# Patient Record
Sex: Male | Born: 1965 | Race: Black or African American | Hispanic: No | Marital: Married | Smoking: Never smoker
Health system: Southern US, Community
[De-identification: ages and names within clinical notes are randomized; demographics above are authoritative.]

## PROBLEM LIST (undated history)

## (undated) DIAGNOSIS — I2510 Atherosclerotic heart disease of native coronary artery without angina pectoris: Secondary | ICD-10-CM

## (undated) DIAGNOSIS — N390 Urinary tract infection, site not specified: Secondary | ICD-10-CM

## (undated) DIAGNOSIS — Z8669 Personal history of other diseases of the nervous system and sense organs: Secondary | ICD-10-CM

## (undated) DIAGNOSIS — E785 Hyperlipidemia, unspecified: Secondary | ICD-10-CM

## (undated) DIAGNOSIS — E1121 Type 2 diabetes mellitus with diabetic nephropathy: Secondary | ICD-10-CM

## (undated) DIAGNOSIS — N529 Male erectile dysfunction, unspecified: Secondary | ICD-10-CM

## (undated) DIAGNOSIS — R7303 Prediabetes: Secondary | ICD-10-CM

## (undated) DIAGNOSIS — Z9861 Coronary angioplasty status: Secondary | ICD-10-CM

## (undated) DIAGNOSIS — I10 Essential (primary) hypertension: Secondary | ICD-10-CM

## (undated) DIAGNOSIS — J0510 Acute epiglottitis without obstruction: Secondary | ICD-10-CM

## (undated) DIAGNOSIS — R945 Abnormal results of liver function studies: Secondary | ICD-10-CM

## (undated) DIAGNOSIS — Z860101 Personal history of adenomatous and serrated colon polyps: Secondary | ICD-10-CM

## (undated) DIAGNOSIS — N138 Other obstructive and reflux uropathy: Secondary | ICD-10-CM

## (undated) DIAGNOSIS — N401 Enlarged prostate with lower urinary tract symptoms: Secondary | ICD-10-CM

## (undated) DIAGNOSIS — R931 Abnormal findings on diagnostic imaging of heart and coronary circulation: Secondary | ICD-10-CM

## (undated) DIAGNOSIS — E119 Type 2 diabetes mellitus without complications: Secondary | ICD-10-CM

## (undated) DIAGNOSIS — Z800 Family history of malignant neoplasm of digestive organs: Secondary | ICD-10-CM

## (undated) DIAGNOSIS — N182 Chronic kidney disease, stage 2 (mild): Secondary | ICD-10-CM

## (undated) DIAGNOSIS — Z86010 Personal history of colonic polyps: Secondary | ICD-10-CM

## (undated) DIAGNOSIS — N183 Chronic kidney disease, stage 3 unspecified: Secondary | ICD-10-CM

## (undated) DIAGNOSIS — R7989 Other specified abnormal findings of blood chemistry: Secondary | ICD-10-CM

## (undated) DIAGNOSIS — D509 Iron deficiency anemia, unspecified: Secondary | ICD-10-CM

## (undated) DIAGNOSIS — Z8249 Family history of ischemic heart disease and other diseases of the circulatory system: Secondary | ICD-10-CM

## (undated) HISTORY — DX: Personal history of adenomatous and serrated colon polyps: Z86.0101

## (undated) HISTORY — DX: Prediabetes: R73.03

## (undated) HISTORY — PX: COLONOSCOPY W/ POLYPECTOMY: SHX1380

## (undated) HISTORY — DX: Male erectile dysfunction, unspecified: N52.9

## (undated) HISTORY — DX: Other specified abnormal findings of blood chemistry: R79.89

## (undated) HISTORY — DX: Abnormal results of liver function studies: R94.5

## (undated) HISTORY — DX: Essential (primary) hypertension: I10

## (undated) HISTORY — DX: Urinary tract infection, site not specified: N39.0

## (undated) HISTORY — DX: Abnormal findings on diagnostic imaging of heart and coronary circulation: R93.1

## (undated) HISTORY — PX: OTHER SURGICAL HISTORY: SHX169

## (undated) HISTORY — DX: Other obstructive and reflux uropathy: N40.1

## (undated) HISTORY — DX: Type 2 diabetes mellitus with diabetic nephropathy: E11.21

## (undated) HISTORY — DX: Benign prostatic hyperplasia with lower urinary tract symptoms: N13.8

## (undated) HISTORY — DX: Atherosclerotic heart disease of native coronary artery without angina pectoris: I25.10

## (undated) HISTORY — DX: Acute epiglottitis without obstruction: J05.10

## (undated) HISTORY — DX: Family history of ischemic heart disease and other diseases of the circulatory system: Z82.49

## (undated) HISTORY — DX: Type 2 diabetes mellitus without complications: E11.9

## (undated) HISTORY — DX: Coronary angioplasty status: Z98.61

## (undated) HISTORY — DX: Hyperlipidemia, unspecified: E78.5

## (undated) HISTORY — DX: Personal history of colonic polyps: Z86.010

## (undated) HISTORY — DX: Family history of malignant neoplasm of digestive organs: Z80.0

## (undated) HISTORY — DX: Chronic kidney disease, stage 2 (mild): N18.2

## (undated) HISTORY — DX: Personal history of other diseases of the nervous system and sense organs: Z86.69

## (undated) HISTORY — DX: Iron deficiency anemia, unspecified: D50.9

## (undated) HISTORY — DX: Chronic kidney disease, stage 3 unspecified: N18.30

---

## 2010-04-01 ENCOUNTER — Ambulatory Visit: Payer: Self-pay | Admitting: Internal Medicine

## 2010-04-01 DIAGNOSIS — E785 Hyperlipidemia, unspecified: Secondary | ICD-10-CM | POA: Insufficient documentation

## 2010-04-01 DIAGNOSIS — N138 Other obstructive and reflux uropathy: Secondary | ICD-10-CM | POA: Insufficient documentation

## 2010-04-01 DIAGNOSIS — Z860100 Personal history of colon polyps, unspecified: Secondary | ICD-10-CM | POA: Insufficient documentation

## 2010-04-01 DIAGNOSIS — I10 Essential (primary) hypertension: Secondary | ICD-10-CM | POA: Insufficient documentation

## 2010-04-01 DIAGNOSIS — N401 Enlarged prostate with lower urinary tract symptoms: Secondary | ICD-10-CM | POA: Insufficient documentation

## 2010-04-01 LAB — CONVERTED CEMR LAB
Blood in Urine, dipstick: NEGATIVE
Nitrite: NEGATIVE
Protein, U semiquant: NEGATIVE
Urobilinogen, UA: 0.2
WBC Urine, dipstick: NEGATIVE

## 2010-04-18 ENCOUNTER — Encounter: Payer: Self-pay | Admitting: Internal Medicine

## 2010-05-23 ENCOUNTER — Encounter: Payer: Self-pay | Admitting: Internal Medicine

## 2010-05-23 LAB — CONVERTED CEMR LAB
Albumin: 4.4 g/dL (ref 3.5–5.2)
Alkaline Phosphatase: 54 units/L (ref 39–117)
BUN: 18 mg/dL (ref 6–23)
CO2: 28 meq/L (ref 19–32)
CRP: 0 mg/dL (ref <0.6)
Chloride: 103 meq/L (ref 96–112)
Creatinine, Ser: 1.3 mg/dL (ref 0.40–1.50)
Indirect Bilirubin: 0.6 mg/dL (ref 0.0–0.9)
LDL Cholesterol: 118 mg/dL — ABNORMAL HIGH (ref 0–99)
PSA: 0.47 ng/mL (ref 0.10–4.00)
Potassium: 4.7 meq/L (ref 3.5–5.3)
Total Bilirubin: 0.7 mg/dL (ref 0.3–1.2)
VLDL: 38 mg/dL (ref 0–40)

## 2010-06-06 ENCOUNTER — Ambulatory Visit: Payer: Self-pay | Admitting: Internal Medicine

## 2010-10-14 NOTE — Assessment & Plan Note (Signed)
Summary: cpx /dt   Vital Signs:  Patient profile:   45 year old male Height:      64.5 inches Weight:      157.50 pounds BMI:     26.71 O2 Sat:      100 % on Room air Temp:     98.4 degrees F oral Pulse rate:   60 / minute Pulse rhythm:   regular Resp:     18 per minute BP sitting:   114 / 68  (right arm) Cuff size:   large  Vitals Entered By: Glendell Docker CMA (June 06, 2010 2:47 PM)  O2 Flow:  Room air CC: CPX Is Patient Diabetic? No Pain Assessment Patient in pain? no        Primary Care Provider:  Dondra Spry DO  CC:  CPX.  History of Present Illness: 45 y/o male for routine cpx he has hx of hyperlipidemia his reports eating "healthy"  Current Diet: Breakfast:  oatmeal from [employer] or [employer] or cereal at home Lunch: [employer] - salad,  less cheese and dressing DInner: vegetable,  salmon 2 x per week,  occ steak,  leg or wing,  fried chicken 2 x per month Snacks:  love peanuts (no salt) Beverage: traveling - eating out more ,  no fast food  Preventive Screening-Counseling & Management  Alcohol-Tobacco     Smoking Status: never  Allergies: No Known Drug Allergies  Past History:  Past Medical History: Hyperlipidemia - low HDL,  mildly elevated LDL   stopped due to side effects - musculoskeletal complaints Hypertension  Colonic polyps, hx of - 2009  ACE inhibitor cough Hx of fainting spells - evaluated by Dr. 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         No lower extremity edema   Physical Exam  General:  alert, well-developed, and well-nourished.   Neck:  No deformities, masses, or tenderness noted. Lungs:  normal respiratory effort and normal breath sounds.   Heart:  normal rate, regular rhythm, and no gallop.   Abdomen:  soft, non-tender, normal bowel sounds, no masses, no hepatomegaly, and no splenomegaly.   Neurologic:  cranial nerves II-XII intact and gait normal.   Psych:  normally interactive, good eye contact, not anxious appearing, and not depressed appearing.     Impression & Recommendations:  Problem # 1:  HEALTH MAINTENANCE EXAM (ICD-V70.0) Reviewed adult health maintenance protocols.  Td Booster: Historical (01/17/2008)   Chol: 185 (05/23/2010)   HDL: 29 (05/23/2010)   LDL: 118 (05/23/2010)   TG: 189 (05/23/2010) TSH: 0.939 (05/23/2010)   PSA: 0.47 (05/23/2010)  Problem # 2:  HYPERTENSION (ICD-401.9) Assessment: Unchanged  His updated medication list for this problem includes:    Micardis 40 Mg Tabs (Telmisartan) .Marland Kitchen... Take 1 tablet by mouth once a day  BP today: 114/68 Prior BP: 116/80 (04/01/2010)  Labs Reviewed: K+: 4.7 (05/23/2010) Creat: : 1.30 (05/23/2010)   Chol: 185 (  05/23/2010)   HDL: 29 (05/23/2010)   LDL: 118 (05/23/2010)   TG: 189 (05/23/2010)  Problem # 3:  HYPERLIPIDEMIA (ICD-272.4) additional dietary changes recommended  Labs Reviewed: SGOT: 27 (05/23/2010)   SGPT: 28 (05/23/2010)   HDL:29 (05/23/2010)  LDL:118 (05/23/2010)  Chol:185 (05/23/2010)  Trig:189 (05/23/2010)  Complete Medication List: 1)  Micardis 40 Mg Tabs (Telmisartan) .... Take 1 tablet by mouth once a day 2)  Multivitamins Tabs (Multiple vitamin) .... Take 1 tablet by mouth once a day 3)  Aspirin Low Dose 81 Mg Tabs (Aspirin) .... Take 1 tablet by mouth once a day  Patient Instructions: 1)  Please schedule a  follow-up appointment in 6 months 2)  Lipid Panel prior to visit, ICD-9:  272.4 3)  High sensitivity CRP :  272.4 4)  Please return for lab work within 6 months Prescriptions: MICARDIS 40 MG TABS (TELMISARTAN) Take 1 tablet by mouth once a day  #90 x 1   Entered and Authorized by:   D. Thomos Lemons DO   Signed by:   D. Thomos Lemons DO on 06/06/2010   Method used:   [id number]       [address]       [place], [place]  [address]       Ph: [phone] or [phone]       Fax: [phone]   RxID:   [id number]

## 2010-10-14 NOTE — Assessment & Plan Note (Signed)
Summary: NEW EST CARE/DT   Vital Signs:  Patient profile:   45 year old male Height:      64.5 inches Weight:      159 pounds BMI:     26.97 O2 Sat:      99 % Temp:     98 degrees F Pulse rate:   57 / minute Pulse rhythm:   regular Resp:     16 per minute BP sitting:   116 / 80  (left arm) Cuff size:   large  Vitals Entered By: Glendell Docker CMA (April 01, 2010 1:33 PM) CC: Rm 2- New Patient  Is Patient Diabetic? No Pain Assessment Patient in pain? no       Does patient need assistance? Functional Status Self care Ambulation Normal Comments meet & greet, increase in urinary frequency, lower right back pain, no abdominal discomfort, slow start   Primary Care Provider:  Dondra Spry DO  CC:  Rm 2- New Patient .  History of Present Illness: 45 y/o AA male to establish he has hx of hyperlipidemia -  low HDL,  mildly elevated LDL he was previously prescribed statin drug but stopped after experiencing muscle aches he has been treated for mild htn with micardis home bp readings are well controlled no side effects.  he denied ED  Over last 1 week, he notes increase in urinary frequency, lower right back pain  no abdominal discomfort, urine flow is slow to start  Preventive Screening-Counseling & Management  Alcohol-Tobacco     Alcohol drinks/day: <1     Smoking Status: never  Caffeine-Diet-Exercise     Caffeine use/day: 1 per beverage 4 times week      Does Patient Exercise: yes     Times/week: 3  Allergies (verified): No Known Drug Allergies  Past History:  Past Medical History: Hyperlipidemia - low HDL,  mildly elevated LDL   stopped due to side effects - musculoskeletal complaints Hypertension Colonic polyps, hx of - 2009  ACE inhibitor cough Hx of fainting spells - evaluated by Dr. 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         no ED  Physical Exam  General:  alert, well-developed, and well-nourished.   Eyes:  pupils equal, pupils round, and pupils reactive to light.   Ears:  R ear normal and L ear normal.   Mouth:  good dentition and pharynx pink and moist.   Neck:  No deformities, masses, or tenderness noted.no carotid bruits.   Lungs:  normal respiratory effort, normal breath sounds, no crackles, and no wheezes.   Heart:  normal rate, regular rhythm, and no gallop.   Abdomen:  soft, non-tender, normal bowel sounds, no masses, no hepatomegaly, and no splenomegaly.   Rectal:  normal sphincter tone and no masses.   Prostate:  slightly boggy and minimal tenderness,  no nodules and  no asymmetry.   Extremities:  No lower extremity edema Neurologic:  cranial nerves II-XII intact and gait normal.   Psych:  normally interactive, good eye contact, not anxious appearing, and not depressed appearing.     Impression & Recommendations:  Problem # 1:  HYPERTENSION (ICD-401.9) well controlled.  Maintain current medication regimen. monitor bmet His updated medication list for this problem includes:    Micardis 40 Mg Tabs (Telmisartan) .Marland Kitchen... Take 1 tablet by mouth once a day  BP today: 116/80  Problem # 2:  HYPERLIPIDEMIA (ICD-272.4) prev rx 'ed statin but stopped due muscle aches. reviewed dietary changes repeat FLP check CRP  Problem # 3:  FREQUENCY, URINARY  (ICD-788.41) mild urinary freq.  prostate is slightly boggy and tender.  tx with cipro  Complete Medication List: 1)  Micardis 40 Mg Tabs (Telmisartan) .... Take 1 tablet by mouth once a day 2)  Multivitamins Tabs (Multiple vitamin) .... Take 1 tablet by mouth once a day 3)  Aspirin Low Dose 81 Mg Tabs (Aspirin) .... Take 1 tablet by mouth once a day 4)  Ciprofloxacin Hcl 250 Mg Tabs (Ciprofloxacin hcl) .... One by mouth two times a day  Other Orders: UA Dipstick w/o Micro (manual) ([address])  Patient Instructions: 1)  Follow low saturated fat diet 2)  Please schedule a follow-up appointment in 2 months. 3)  BMP prior to visit, ICD-9: 401.9 4)  Hepatic Panel prior to visit, ICD-9: 272.4 5)  Lipid Panel prior to visit, ICD-9: 272.4 6)  TSH prior to visit, ICD-9: 272.4 7)  PSA prior to visit, ICD-9:  600.01 8)  High sensitivity CRP:  272.4 9)  Please return for lab work one (1) week before your next appointment.  10)  [email or web address] Prescriptions: CIPROFLOXACIN HCL 250 MG TABS (CIPROFLOXACIN HCL) one by mouth two times a day  #20 x 0   Entered and Authorized by:   D. Thomos Lemons DO   Signed by:   D. Thomos Lemons DO on 04/01/2010   Method used:   Print then Give to Patient   RxID:   [id number]      Preventive Care Screening  Last Tetanus Booster:    Date:  01/17/2008    Results:  Historical     Current Allergies (reviewed today): No known allergies   Laboratory Results   Urine Tests    Routine Urinalysis   Color: yellow Appearance: Clear Glucose: negative   (Normal Range: Negative) Bilirubin: negative   (Normal Range: Negative) Ketone: negative   (Normal Range: Negative) Spec. Gravity: 1.020   (Normal Range: 1.003-1.035) Blood: negative   (Normal Range: Negative) pH: 6.5   (Normal Range: 5.0-8.0) Protein: negative   (Normal Range: Negative) Urobilinogen: 0.2   (Normal Range: 0-1) Nitrite: negative   (Normal Range: Negative) Leukocyte Esterace: negative    (Normal Range: Negative)

## 2010-10-14 NOTE — Miscellaneous (Signed)
Summary: Lab orders for September appt  Clinical Lists Changes  Orders: Added new Test order of T-Basic Metabolic Panel [phone]) - Signed Added new Test order of T-Hepatic Function [phone]) - Signed Added new Test order of T-Lipid Profile [id number]) - Signed Added new Test order of T-TSH [phone]) - Signed Added new Test order of T-PSA ([id number]) - Signed Added new Test order of T-C-Reactive Protein [phone]) - Signed

## 2011-02-03 ENCOUNTER — Telehealth: Payer: Self-pay | Admitting: Internal Medicine

## 2011-02-03 DIAGNOSIS — E785 Hyperlipidemia, unspecified: Secondary | ICD-10-CM

## 2011-02-03 DIAGNOSIS — I10 Essential (primary) hypertension: Secondary | ICD-10-CM

## 2011-02-03 MED ORDER — TELMISARTAN 40 MG PO TABS: 40.0000 mg | ORAL_TABLET | Freq: Every day | ORAL | Status: DC

## 2011-02-03 NOTE — Telephone Encounter (Signed)
Call placed to patient [phone], no answer. A detailed voice message was left informing patient rx would be sent to pharmacy. Message also left informing patient that was he was past due for 6 month follow up with fasting blood work.  He was advised to call back to schedule follow up and lab orders entered for [facility].  Rx refill telephoned to [facility]ist at [facility]

## 2011-02-03 NOTE — Telephone Encounter (Signed)
Refill- micardis 40mg  tablet. Take 1 tablet by mouth once a day. Qty 90. Last fill 4.19.12

## 2011-02-05 ENCOUNTER — Encounter: Payer: Self-pay | Admitting: Family Medicine

## 2011-02-05 ENCOUNTER — Ambulatory Visit (INDEPENDENT_AMBULATORY_CARE_PROVIDER_SITE_OTHER): Payer: BC Managed Care – PPO | Admitting: Family Medicine

## 2011-02-05 ENCOUNTER — Other Ambulatory Visit: Payer: Self-pay | Admitting: Family Medicine

## 2011-02-05 VITALS — BP 147/89 | HR 71 | Temp 98.1°F | Ht 64.5 in | Wt 160.8 lb

## 2011-02-05 DIAGNOSIS — N390 Urinary tract infection, site not specified: Secondary | ICD-10-CM

## 2011-02-05 DIAGNOSIS — N402 Nodular prostate without lower urinary tract symptoms: Secondary | ICD-10-CM

## 2011-02-05 DIAGNOSIS — N410 Acute prostatitis: Secondary | ICD-10-CM

## 2011-02-05 LAB — POCT URINALYSIS DIPSTICK
Glucose, UA: NEGATIVE
Nitrite, UA: NEGATIVE
Urobilinogen, UA: 0.2

## 2011-02-05 MED ORDER — CIPROFLOXACIN HCL 500 MG PO TABS: 500.0000 mg | ORAL_TABLET | Freq: Two times a day (BID) | ORAL | Status: AC

## 2011-02-05 NOTE — Assessment & Plan Note (Signed)
Refer to [facility] for further eval/management.

## 2011-02-05 NOTE — Progress Notes (Signed)
OFFICE VISIT  02/05/2011   CC:  Chief Complaint  Patient presents with  . Urinary Tract Infection    Low back pain, burning during urination X 4 days     HPI:    Patient is a 45 y.o. African-American male who presents for urinary complaints. Normally sees Dr. Artist Pais at the [facility] office. Reports having flu-like symptoms last week and into this week: about 7d of subjective f/c, cough, achiness, URI sx's.  With these symptoms he also developed increased urinary frequency, mild urgency and dysuria, and frequent sense of incomplete bladder emptying.  No blood in urine.  +Mild diffuse low back soreness.  The flu-like/resp sx's/fevers all resolved several days ago but his urinary complaints and his low back soreness persist. He has no history of high risk sexual behavior (married, no infidelity, no hx of STD).  He had acute prostatitis in 2011 which clinically resolved with a course of cipro.  He has mild chronic BPH symptoms that are not to the level of needing treatment.  No history of abnormal prostate exams and he has not seen a urologist in the past.  He had a normal PSA last year ( Lab Results  Component Value Date   PSA 0.47 05/23/2010  He did take some tylenol and mucinex-D during his recent illness.    Past Medical History  Diagnosis Date  . Hypertension   As noted above in HPI.  Past Surgical History  Procedure Date  . Dental implants    SH: Married, one child.  They both recently had the same viral syndrome he had. He is a Engineer, materials, lives in [place], [place]. No Tobacco, rare ETOH, no drug use.  Outpatient Prescriptions Prior to Visit  Medication Sig Dispense Refill  . telmisartan (MICARDIS) 40 MG tablet Take 1 tablet (40 mg total) by mouth daily.  30 tablet  0    No Known Allergies  ROS No chronic change in appetite, no unexplained wt loss, no bone pain, no skin rash, no abdominal pain, no chest pain, no SOB, no joint swelling.  No focal weakness.  PE: Blood pressure  147/89, pulse 71, temperature 98.1 F (36.7 C), temperature source Oral, height 5' 4.5" (1.638 m), weight 160 lb 12.8 oz (72.938 kg), SpO2 98.00%. Gen: Alert, well appearing.  Patient is oriented to person, place, time, and situation. Rectal: tone normal.  Rectal mucosa normal to palpation.  His prostate feels normal size but is diffusely very firm and there is asymmetry of shape noted, with the left side being much more prominent than the right.  There is mild TTP on prostate gland diffusely, but worse on right side.  LABS:  CC UA today showed small LEU and was otherwise normal. Urine culture pending  IMPRESSION AND PLAN:  Prostatitis, acute Cipro 500mg  bid x 14d.  Therapeutic expectations and side effect profile of medication discussed today.  Patient's questions answered.  Discussed dx with him, including the fact that sometimes bacterial prostate infections don't result in a positive urine culture result, plus some prostate infections are nonbacterial).   I encouraged him to f/u with either me or Dr. Artist Pais if his symptoms are not improving with the abx in the next 4-6 days.  Prostate nodule Refer to [facility] for further eval/management.     FOLLOW UP: Return if symptoms worsen or fail to improve.

## 2011-02-05 NOTE — Assessment & Plan Note (Signed)
Cipro 500mg  bid x 14d.  Therapeutic expectations and side effect profile of medication discussed today.  Patient's questions answered.  Discussed dx with him, including the fact that sometimes bacterial prostate infections don't result in a positive urine culture result, plus some prostate infections are nonbacterial).   I encouraged him to f/u with either me or Dr. Artist Pais if his symptoms are not improving with the abx in the next 4-6 days.

## 2011-02-08 LAB — URINE CULTURE: Colony Count: >=100000

## 2011-03-16 ENCOUNTER — Other Ambulatory Visit: Payer: Self-pay | Admitting: *Deleted

## 2011-03-16 DIAGNOSIS — I10 Essential (primary) hypertension: Secondary | ICD-10-CM

## 2011-03-16 MED ORDER — TELMISARTAN 40 MG PO TABS: 40.0000 mg | ORAL_TABLET | Freq: Every day | ORAL | Status: DC

## 2011-03-16 NOTE — Telephone Encounter (Signed)
Call received from pharmacy for refill on Micardis

## 2011-03-27 ENCOUNTER — Other Ambulatory Visit: Payer: Self-pay | Admitting: Family Medicine

## 2011-03-27 DIAGNOSIS — I10 Essential (primary) hypertension: Secondary | ICD-10-CM

## 2011-03-27 NOTE — Telephone Encounter (Signed)
Please advise 

## 2011-03-27 NOTE — Telephone Encounter (Signed)
RX called in on 03/16/11.  Per Arline Asp at pharmacy RX is there and ready to be picked up.  Advised pt RX ready.

## 2011-03-27 NOTE — Telephone Encounter (Signed)
[facility]

## 2011-04-07 ENCOUNTER — Encounter: Payer: BC Managed Care – PPO | Admitting: Family Medicine

## 2011-04-08 ENCOUNTER — Encounter: Payer: Self-pay | Admitting: Family Medicine

## 2011-04-13 ENCOUNTER — Encounter: Payer: Self-pay | Admitting: Family Medicine

## 2011-04-13 ENCOUNTER — Telehealth: Payer: Self-pay | Admitting: Family Medicine

## 2011-04-13 NOTE — Telephone Encounter (Signed)
Please request record of his initial consult visit with [facility].  I got the office note from their follow up visit with him today, but I never got sent the initial visit note.  thx--PM

## 2011-04-17 ENCOUNTER — Encounter: Payer: Self-pay | Admitting: Family Medicine

## 2011-04-17 ENCOUNTER — Ambulatory Visit (INDEPENDENT_AMBULATORY_CARE_PROVIDER_SITE_OTHER): Payer: BC Managed Care – PPO | Admitting: Family Medicine

## 2011-04-17 DIAGNOSIS — I10 Essential (primary) hypertension: Secondary | ICD-10-CM

## 2011-04-17 DIAGNOSIS — N410 Acute prostatitis: Secondary | ICD-10-CM

## 2011-04-17 NOTE — Progress Notes (Signed)
OFFICE NOTE  04/17/2011  CC:  Chief Complaint  Patient presents with  . Annual Exam    no complaints     HPI: Patient is a 45 y.o. African-American male who is here for follow up prostatitis and HTN. Initially made appt for annual CPE but it has not been a year since his last CPE and labs so we decided to reschedule this. No high bps to report, +compliant with meds, no side effects. Saw urologist, who feels that his abnl prostate exam is not worrisome, repeat PSA there was reassuring.  They did feel like his prostate infection required another round of antibiotics, so this was done and he feels normal now.  Pertinent PMH:  HTN Prostatitis Hyperlipidemia  Pertinent Meds:  PE: Blood pressure 149/89, pulse 55, temperature 97.7 F (36.5 C), temperature source Oral, height 5' 5.5" (1.664 m), weight 161 lb (73.029 kg), SpO2 98.00%.   This bp was with our automated cuff.  I rechecked his bp in each arm with the manual bp cuff in the exam room and got 130/84 on right, 126/82 on left. Gen: Alert, well appearing.  Patient is oriented to person, place, time, and situation. No further exam today.  IMPRESSION AND PLAN: HTN, stable.  Continue current med. Prostatitis, resolved.  Asymmetric prostate on exam--reassured by urologist that this was not a concern, PSA normal.  FOLLOW UP: Make appt for annual CPE with fasting labs after 06/07/11.

## 2011-04-21 NOTE — Telephone Encounter (Signed)
Records have been received.

## 2011-07-07 ENCOUNTER — Other Ambulatory Visit: Payer: Self-pay | Admitting: *Deleted

## 2011-07-07 DIAGNOSIS — I10 Essential (primary) hypertension: Secondary | ICD-10-CM

## 2011-07-07 MED ORDER — TELMISARTAN 40 MG PO TABS: 40.0000 mg | ORAL_TABLET | Freq: Every day | ORAL | Status: DC

## 2011-07-07 NOTE — Telephone Encounter (Signed)
Pt is due for follow up, CPE.  30 day supply given.

## 2011-08-17 ENCOUNTER — Other Ambulatory Visit: Payer: Self-pay | Admitting: *Deleted

## 2011-08-17 DIAGNOSIS — I10 Essential (primary) hypertension: Secondary | ICD-10-CM

## 2011-08-17 MED ORDER — TELMISARTAN 40 MG PO TABS: 40.0000 mg | ORAL_TABLET | Freq: Every day | ORAL | Status: DC

## 2011-08-17 NOTE — Telephone Encounter (Signed)
RF done x 5. F/u in 3 mo for a routine hypertension follow up OR if he wants he can make this appt for a CPE with fasting labs (30 minute morning appt).

## 2011-08-17 NOTE — Telephone Encounter (Signed)
Pt notified and appt scheduled for 11/03/11 for CPE w/fasting labs.

## 2011-08-17 NOTE — Telephone Encounter (Signed)
Last seen 04/17/11, no follow up mentioned except for CPE after 06/07/11.  No appt on file.  OK to fill?  Pt would like more than one refill at a time.  How many refills?

## 2011-11-03 ENCOUNTER — Encounter: Payer: BC Managed Care – PPO | Admitting: Family Medicine

## 2011-11-04 ENCOUNTER — Telehealth: Payer: Self-pay | Admitting: Family Medicine

## 2011-11-04 ENCOUNTER — Ambulatory Visit (INDEPENDENT_AMBULATORY_CARE_PROVIDER_SITE_OTHER): Payer: BC Managed Care – PPO | Admitting: Family Medicine

## 2011-11-04 ENCOUNTER — Encounter: Payer: Self-pay | Admitting: Family Medicine

## 2011-11-04 VITALS — BP 128/85 | HR 57 | Temp 97.1°F | Ht 65.5 in | Wt 160.0 lb

## 2011-11-04 DIAGNOSIS — Z125 Encounter for screening for malignant neoplasm of prostate: Secondary | ICD-10-CM

## 2011-11-04 DIAGNOSIS — Z0000 Encounter for general adult medical examination without abnormal findings: Secondary | ICD-10-CM | POA: Insufficient documentation

## 2011-11-04 DIAGNOSIS — I10 Essential (primary) hypertension: Secondary | ICD-10-CM

## 2011-11-04 DIAGNOSIS — N402 Nodular prostate without lower urinary tract symptoms: Secondary | ICD-10-CM

## 2011-11-04 LAB — CBC WITH DIFFERENTIAL/PLATELET
Eosinophils Relative: 3.3 % (ref 0.0–5.0)
MCV: 94.3 fl (ref 78.0–100.0)
Monocytes Absolute: 0.4 10*3/uL (ref 0.1–1.0)
Monocytes Relative: 11.9 % (ref 3.0–12.0)
Neutrophils Relative %: 36 % — ABNORMAL LOW (ref 43.0–77.0)
Platelets: 163 10*3/uL (ref 150.0–400.0)
WBC: 3.5 10*3/uL — ABNORMAL LOW (ref 4.5–10.5)

## 2011-11-04 LAB — LIPID PANEL
Cholesterol: 213 mg/dL — ABNORMAL HIGH (ref 0–200)
VLDL: 48.4 mg/dL — ABNORMAL HIGH (ref 0.0–40.0)

## 2011-11-04 LAB — COMPREHENSIVE METABOLIC PANEL
AST: 29 U/L (ref 0–37)
BUN: 17 mg/dL (ref 6–23)
Calcium: 9.2 mg/dL (ref 8.4–10.5)
Chloride: 100 mEq/L (ref 96–112)
Creatinine, Ser: 1.3 mg/dL (ref 0.4–1.5)
GFR: 75.16 mL/min (ref >60.00)
Glucose, Bld: 89 mg/dL (ref 70–99)

## 2011-11-04 LAB — TSH: TSH: 0.64 u[IU]/mL (ref 0.35–5.50)

## 2011-11-04 LAB — PSA: PSA: 1.74 ng/mL (ref 0.10–4.00)

## 2011-11-04 LAB — LDL CHOLESTEROL, DIRECT: Direct LDL: 119.9 mg/dL

## 2011-11-04 MED ORDER — TELMISARTAN 40 MG PO TABS: 40.0000 mg | ORAL_TABLET | Freq: Every day | ORAL | Status: DC

## 2011-11-04 NOTE — Patient Instructions (Signed)
Health Maintenance, Males A healthy lifestyle and preventative care can promote health and wellness.  Maintain regular health, dental, and eye exams.   Eat a healthy diet. Foods like vegetables, fruits, whole grains, low-fat dairy products, and lean protein foods contain the nutrients you need without too many calories. Decrease your intake of foods high in solid fats, added sugars, and salt. Get information about a proper diet from your caregiver, if necessary.   Regular physical exercise is one of the most important things you can do for your health. Most adults should get at least 150 minutes of moderate-intensity exercise (any activity that increases your heart rate and causes you to sweat) each week. In addition, most adults need muscle-strengthening exercises on 2 or more days a week.    Maintain a healthy weight. The body mass index (BMI) is a screening tool to identify possible weight problems. It provides an estimate of body fat based on height and weight. Your caregiver can help determine your BMI, and can help you achieve or maintain a healthy weight. For adults 20 years and older:   A BMI below 18.5 is considered underweight.   A BMI of 18.5 to 24.9 is normal.   A BMI of 25 to 29.9 is considered overweight.   A BMI of 30 and above is considered obese.   Maintain normal blood lipids and cholesterol by exercising and minimizing your intake of saturated fat. Eat a balanced diet with plenty of fruits and vegetables. Blood tests for lipids and cholesterol should begin at age 20 and be repeated every 5 years. If your lipid or cholesterol levels are high, you are over 50, or you are a high risk for heart disease, you may need your cholesterol levels checked more frequently.Ongoing high lipid and cholesterol levels should be treated with medicines, if diet and exercise are not effective.   If you smoke, find out from your caregiver how to quit. If you do not use tobacco, do not start.    If you choose to drink alcohol, do not exceed 2 drinks per day. One drink is considered to be 12 ounces (355 mL) of beer, 5 ounces (148 mL) of wine, or 1.5 ounces (44 mL) of liquor.   Avoid use of street drugs. Do not share needles with anyone. Ask for help if you need support or instructions about stopping the use of drugs.   High blood pressure causes heart disease and increases the risk of stroke. Blood pressure should be checked at least every 1 to 2 years. Ongoing high blood pressure should be treated with medicines if weight loss and exercise are not effective.   If you are 45 to 48 years old, ask your caregiver if you should take aspirin to prevent heart disease.   Diabetes screening involves taking a blood sample to check your fasting blood sugar level. This should be done once every 3 years, after age 45, if you are within normal weight and without risk factors for diabetes. Testing should be considered at a younger age or be carried out more frequently if you are overweight and have at least 1 risk factor for diabetes.   Colorectal cancer can be detected and often prevented. Most routine colorectal cancer screening begins at the age of 50 and continues through age 75. However, your caregiver may recommend screening at an earlier age if you have risk factors for colon cancer. On a yearly basis, your caregiver may provide home test kits to check for hidden   blood in the stool. Use of a small camera at the end of a tube, to directly examine the colon (sigmoidoscopy or colonoscopy), can detect the earliest forms of colorectal cancer. Talk to your caregiver about this at age 50, when routine screening begins. Direct examination of the colon should be repeated every 5 to 10 years through age 75, unless early forms of pre-cancerous polyps or small growths are found.   Healthy men should no longer receive prostate-specific antigen (PSA) blood tests as part of routine cancer screening. Consult with  your caregiver about prostate cancer screening.   Practice safe sex. Use condoms and avoid high-risk sexual practices to reduce the spread of sexually transmitted infections (STIs).   Use sunscreen with a sun protection factor (SPF) of 30 or greater. Apply sunscreen liberally and repeatedly throughout the day. You should seek shade when your shadow is shorter than you. Protect yourself by wearing long sleeves, pants, a wide-brimmed hat, and sunglasses year round, whenever you are outdoors.   Notify your caregiver of new moles or changes in moles, especially if there is a change in shape or color. Also notify your caregiver if a mole is larger than the size of a pencil eraser.   A one-time screening for abdominal aortic aneurysm (AAA) and surgical repair of large AAAs by sound wave imaging (ultrasonography) is recommended for ages 65 to 75 years who are current or former smokers.   Stay current with your immunizations.  Document Released: 02/27/2008 Document Revised: 05/13/2011 Document Reviewed: 01/26/2011 [employer] Patient Information 2012 [employer]. 

## 2011-11-04 NOTE — Assessment & Plan Note (Signed)
Urologist felt this was inflammitory-related and it appears he was right, as his prostate feels normal today and he is asymptomatic. Screening PSA today.

## 2011-11-04 NOTE — Telephone Encounter (Signed)
Pls request records from [facility] in [place].  thx.

## 2011-11-04 NOTE — Progress Notes (Signed)
Office Note 11/04/2011  CC:  Chief Complaint  Patient presents with  . Annual Exam    no problems, fasting labs    HPI:  Thomas Spencer is a 46 y.o. Black male who is here for CPE and fasting labs. Feeling well.  Denies any urinary complaints at all lately. Complains of some mild chronic bilat shoulder pains, improved by exercising/lifting wts. No weakness or paresthesias, no impairment in activities/functioning, no need for OTC pain meds.   Past Medical History  Diagnosis Date  . Hypertension   . BPH with obstruction/lower urinary tract symptoms     [facility] referral done 01/2011  . Family history of colon cancer     father    Past Surgical History  Procedure Date  . Dental implants   . Colonoscopy w/ polypectomy approx 2010    [facility]--pt reports benign    Family History  Problem Relation Age of Onset  . Heart attack Mother   . Heart disease Mother   . Hyperlipidemia Mother   . Hypertension Mother   . Cancer Father     colon  . Cancer Sister     cervical/ remission  . Heart disease Brother   . Hyperlipidemia Brother   . Hypertension Brother   . Diabetes Sister     type 2  . Hypertension Sister   . Hypertension Sister   . Diabetes Sister     type 2    History   Social History  . Marital Status: Married    Spouse Name: N/A    Number of Children: N/A  . Years of Education: N/A   Occupational History  . Not on file.   Social History Main Topics  . Smoking status: Never Smoker   . Smokeless tobacco: Never Used  . Alcohol Use: Yes     wine- socially  . Drug Use: No  . Sexually Active: Yes -- Male partner(s)   Other Topics Concern  . Not on file   Social History Narrative   SH: Married, one child.  He is a Engineer, materials, lives in [place], [place].No Tobacco, rare ETOH, no drug use.    Outpatient Prescriptions Prior to Visit  Medication Sig Dispense Refill  . aspirin 81 MG tablet Take 81 mg by mouth daily.        . Multiple  Vitamin (MULTIVITAMIN) tablet Take 1 tablet by mouth daily.        . Omega-3 Fatty Acids (FISH OIL) 1000 MG CAPS Take 2 capsules by mouth daily.       Marland Kitchen telmisartan (MICARDIS) 40 MG tablet Take 1 tablet (40 mg total) by mouth daily. PT DUE FOR APPT FOR MORE REFILLS  30 tablet  5    Allergies  Allergen Reactions  . Ace Inhibitors Cough    ROS Review of Systems  Constitutional: Negative for fever, chills, appetite change and fatigue.  HENT: Negative for ear pain, congestion, sore throat, neck stiffness and dental problem.   Eyes: Negative for discharge, redness and visual disturbance.  Respiratory: Negative for cough, chest tightness, shortness of breath and wheezing.   Cardiovascular: Negative for chest pain, palpitations and leg swelling.  Gastrointestinal: Negative for nausea, vomiting, abdominal pain, diarrhea and blood in stool.  Genitourinary: Negative for dysuria, urgency, frequency, hematuria, flank pain and difficulty urinating.  Musculoskeletal: Positive for arthralgias (shoulders, as noted in HPI). Negative for myalgias, back pain and joint swelling.  Skin: Negative for pallor and rash.  Neurological: Negative for dizziness,  speech difficulty, weakness and headaches.  Hematological: Negative for adenopathy. Does not bruise/bleed easily.  Psychiatric/Behavioral: Negative for confusion and sleep disturbance. The patient is not nervous/anxious.     PE; Blood pressure 128/85, pulse 57, temperature 97.1 F (36.2 C), temperature source Temporal, height 5' 5.5" (1.664 m), weight 160 lb (72.576 kg). Gen: Alert, well appearing.  Patient is oriented to person, place, time, and situation. ENT: Ears: EACs clear, normal epithelium.  TMs with good light reflex and landmarks bilaterally.  Eyes: no injection, icteris, swelling, or exudate.  EOMI, PERRLA. Nose: no drainage or turbinate edema/swelling.  No injection or focal lesion.  Mouth: lips without lesion/swelling.  Oral mucosa pink and  moist.  Dentition intact and without obvious caries or gingival swelling.  Oropharynx without erythema, exudate, or swelling.  Neck - No masses or thyromegaly or limitation in range of motion CV: RRR, no m/r/g.   LUNGS: CTA bilat, nonlabored resps, good aeration in all lung fields. ABD: soft, NT, ND, BS normal.  No hepatospenomegaly or mass.  No bruits. EXT: no clubbing, cyanosis, or edema.  Shoulders without TTP.  No pain or stiffness with ROM, no limitation in ROM.  Strength 5/5 UEs bilat. Genitals normal; both testes normal without tenderness, masses, hydroceles, varicoceles, erythema or swelling. Shaft normal, uncircumcised, meatus normal without discharge. No inguinal hernia noted but slight palpable pressure can be felt in left ingu canal when pt asked to forcefully cough. No inguinal lymphadenopathy.  Rectal exam: negative without mass, lesions or tenderness.  Prostate is without enlargement or asymmetry or nodule.   Pertinent labs:  None today  ASSESSMENT AND PLAN:   Health maintenance examination Reviewed age and gender appropriate health maintenance issues (prudent diet, regular exercise, health risks of tobacco and excessive alcohol, use of seatbelts, fire alarms in home, use of sunscreen).  Also reviewed age and gender appropriate health screening as well as vaccine recommendations. Colonoscopy (+FH) is UTD. Check PSA today (DRE normal today), as well as FLP, CBC, CMET, and TSH.   Prostate nodule Urologist felt this was inflammitory-related and it appears he was right, as his prostate feels normal today and he is asymptomatic. Screening PSA today.   HTN: well controlled.  Micardis RFd today.  FOLLOW UP:  Return in about 6 months (around 05/03/2012) for f/u HTN.

## 2011-11-04 NOTE — Assessment & Plan Note (Signed)
Reviewed age and gender appropriate health maintenance issues (prudent diet, regular exercise, health risks of tobacco and excessive alcohol, use of seatbelts, fire alarms in home, use of sunscreen).  Also reviewed age and gender appropriate health screening as well as vaccine recommendations. Colonoscopy (+FH) is UTD. Check PSA today (DRE normal today), as well as FLP, CBC, CMET, and TSH.

## 2011-11-04 NOTE — Telephone Encounter (Signed)
[facility] will fax 2007 OV notes from appt with Dr Laural Benes

## 2011-11-05 ENCOUNTER — Encounter: Payer: Self-pay | Admitting: Family Medicine

## 2011-11-05 ENCOUNTER — Telehealth: Payer: Self-pay | Admitting: Family Medicine

## 2011-11-05 NOTE — Telephone Encounter (Signed)
Pt notified and he will call [facility] to schedule.

## 2011-11-05 NOTE — Telephone Encounter (Signed)
[facility] records indicate his last colonoscopy was 2007 and repeat was recommended 5 yrs later, which makes him DUE for this now. Per our conversation yesterday during his o/v he seemed to think he had a few years to go before repeat was needed.  Pls notify him that he should contact [facility] for f/u regarding this issue (He saw Dr. Danise Edge)  thx--PM

## 2011-11-10 ENCOUNTER — Telehealth: Payer: Self-pay | Admitting: Family Medicine

## 2011-11-10 DIAGNOSIS — R972 Elevated prostate specific antigen [PSA]: Secondary | ICD-10-CM

## 2011-11-10 NOTE — Telephone Encounter (Signed)
Spoke with Dr. Isabel Caprice, pt's urologist, about recent PSA showing increased PSA velocity compared to PSA done about 554mo ago. Dr. Isabel Caprice voiced concern and recommended pt make appt with him to discuss this.  He said if pt was reluctant to do this at this time, then he would recommend repeat PSA in 54mo and proceed as appropriate based on results of that test.  I called pt this morning and have LMOM to call med back at office today.

## 2011-11-11 NOTE — Telephone Encounter (Signed)
Spoke w/ pt today and discussed PSA and cholesterol results. Plan: repeat PSA in 85mo in our lab and if level same or higher then he'll proceed with office f/u with Dr. Isabel Caprice. If PSA lower, I'll touch base with Dr. Isabel Caprice about ongoing surveillance. Plan for cholesterol is to INCREASE exercise and focus more on low chol/low fat diet, repeat lipid panel in 26mo. Pt to call back and make lab appts.--PM

## 2011-11-16 ENCOUNTER — Telehealth: Payer: Self-pay | Admitting: Family Medicine

## 2011-11-16 NOTE — Telephone Encounter (Signed)
Patient is requesting to speak with Dr Milinda Cave

## 2011-11-16 NOTE — Telephone Encounter (Signed)
Returned call to pt.  Pt will not disclose why he needs to speak with physician and states there is nothing that I can help him with.  Advised pt that Dr. Milinda Cave is with patients this PM and I can not guarantee that he will receive a call back today.  Pt states that is OK, that any day this week would be OK.

## 2011-11-17 NOTE — Telephone Encounter (Signed)
Called pt.  He has changed his mind and wants to pursue further evaluation and management of his increased PSA velocity with Dr. Isabel Caprice.  I said this was fine and encouraged him to call Dr. Ellin Goodie office for f/u appt since he has already seen him once and does not need referral from [place].

## 2011-11-23 ENCOUNTER — Encounter: Payer: Self-pay | Admitting: *Deleted

## 2012-01-13 HISTORY — PX: PROSTATE BIOPSY: SHX241

## 2012-02-03 ENCOUNTER — Encounter: Payer: Self-pay | Admitting: Family Medicine

## 2012-03-07 ENCOUNTER — Ambulatory Visit (INDEPENDENT_AMBULATORY_CARE_PROVIDER_SITE_OTHER): Payer: BC Managed Care – PPO | Admitting: Family Medicine

## 2012-03-07 ENCOUNTER — Encounter: Payer: Self-pay | Admitting: Family Medicine

## 2012-03-07 VITALS — BP 130/82 | HR 51 | Temp 97.2°F | Ht 65.5 in | Wt 164.0 lb

## 2012-03-07 DIAGNOSIS — J302 Other seasonal allergic rhinitis: Secondary | ICD-10-CM | POA: Insufficient documentation

## 2012-03-07 DIAGNOSIS — H6120 Impacted cerumen, unspecified ear: Secondary | ICD-10-CM

## 2012-03-07 DIAGNOSIS — J309 Allergic rhinitis, unspecified: Secondary | ICD-10-CM

## 2012-03-07 MED ORDER — FLUTICASONE PROPIONATE 50 MCG/ACT NA SUSP: 2.0000 | Freq: Every day | NASAL | Status: DC

## 2012-03-07 MED ORDER — DESLORATADINE 5 MG PO TABS: 5.0000 mg | ORAL_TABLET | Freq: Every day | ORAL | Status: DC

## 2012-03-07 NOTE — Progress Notes (Signed)
OFFICE NOTE  03/07/2012  CC:  Chief Complaint  Patient presents with  . Sore Throat    x 1 month     HPI: Patient is a 46 y.o. African-American male who is here for sore throat x approximately 1 month. Describes some left ear fullness/popping intermittently, sore throat and some intermittent--lasts 2 d or so--left sided face swelling over mandible in front of left ear and under mandible in neck soft tissues.  No teeth pain.  Feels "low grade" fever when he has swelling.  Some mild PND.  Minimal coughing.  Energy and appetite normal/unchanged.   Valsalva helps sometimes to open up left ear.  No meds have been tried.  Pertinent PMH:  Past Medical History  Diagnosis Date  . Hypertension   . BPH with obstruction/lower urinary tract symptoms     [facility] referral done 01/2011 (Dr. Isabel Caprice); ultimately a prostate biopsy was done for increased PSA velocity--April 2013--and it  was completely normal.  . Family history of colon cancer     father  . Hyperlipidemia     lipid disorder per Dr. Deborah Chalk  . Elevated liver function tests     mild  . Family history of premature coronary heart disease     strong family history  . Family history of hypertension     strong family history    MEDS:  Outpatient Prescriptions Prior to Visit  Medication Sig Dispense Refill  . aspirin 81 MG tablet Take 81 mg by mouth daily.        . Multiple Vitamin (MULTIVITAMIN) tablet Take 1 tablet by mouth daily.        . Omega-3 Fatty Acids (FISH OIL) 1000 MG CAPS Take 2 capsules by mouth daily.       Marland Kitchen telmisartan (MICARDIS) 40 MG tablet Take 1 tablet (40 mg total) by mouth daily.  90 tablet  0    PE: Blood pressure 130/82, pulse 51, temperature 97.2 F (36.2 C), temperature source Temporal, height 5' 5.5" (1.664 m), weight 164 lb (74.39 kg). VS: noted--normal. Gen: alert, NAD, NONTOXIC APPEARING. HEENT: eyes without injection, drainage, or swelling.  Ears: Right EAC clear, TM with normal light  reflex and landmarks.  Left EAC with 80% cerumen impaction, removed with my curette today w/out problem and pt immediately said he could hear better.  EAC then appeared normal and TM did as well.  Nose: No active rhinorrhea but turbinates with some dried, crusty exudate adherent to mildly injected mucosa.  No purulent d/c.  No paranasal sinus TTP.  No facial swelling.  Throat and mouth without focal lesion.  No pharyngial swelling, erythema, or exudate.   Neck: supple, no LAD.   LUNGS: CTA bilat, nonlabored resps.   CV: RRR, no m/r/g. EXT: no c/c/e  LABS: none  IMPRESSION AND PLAN:  Cerumen impaction Removed by myself with ear curette today.  EAC and TM are unremarkable. Discussed possible use of OTC drops like debrox in the future.  Seasonal allergic rhinitis With some eustacian tube dysfunction, some PND pharyngitis, and intermittent left sided preauricular and submandibular soft tissue swelling c/w mild adenopathy.  This swelling is not present today. I recommended he start daily flonase 2 sprays each nostril qd and daily clarinex 5mg  qd. Return if swelling returns and stays or if he starts getting true fevers, facial or ear pain, etc.       FOLLOW UP: prn

## 2012-03-07 NOTE — Assessment & Plan Note (Signed)
Removed by myself with ear curette today.  EAC and TM are unremarkable. Discussed possible use of OTC drops like debrox in the future.

## 2012-03-07 NOTE — Assessment & Plan Note (Signed)
With some eustacian tube dysfunction, some PND pharyngitis, and intermittent left sided preauricular and submandibular soft tissue swelling c/w mild adenopathy.  This swelling is not present today. I recommended he start daily flonase 2 sprays each nostril qd and daily clarinex 5mg  qd. Return if swelling returns and stays or if he starts getting true fevers, facial or ear pain, etc.

## 2012-04-07 ENCOUNTER — Other Ambulatory Visit: Payer: Self-pay | Admitting: *Deleted

## 2012-04-07 DIAGNOSIS — I10 Essential (primary) hypertension: Secondary | ICD-10-CM

## 2012-04-07 DIAGNOSIS — Z0000 Encounter for general adult medical examination without abnormal findings: Secondary | ICD-10-CM

## 2012-04-07 MED ORDER — TELMISARTAN 40 MG PO TABS: 40.0000 mg | ORAL_TABLET | Freq: Every day | ORAL | Status: DC

## 2012-04-07 NOTE — Telephone Encounter (Signed)
Faxed refill request received from pharmacy for 90 day Micardis Last filled by MD on 11/04/11, 90 x 0 Last seen on 11/04/11 Follow up in 6 months, no appt in [facility] sent.

## 2012-08-09 ENCOUNTER — Other Ambulatory Visit: Payer: Self-pay | Admitting: *Deleted

## 2012-08-09 DIAGNOSIS — Z0000 Encounter for general adult medical examination without abnormal findings: Secondary | ICD-10-CM

## 2012-08-09 DIAGNOSIS — I10 Essential (primary) hypertension: Secondary | ICD-10-CM

## 2012-08-09 MED ORDER — TELMISARTAN 40 MG PO TABS: 40.0000 mg | ORAL_TABLET | Freq: Every day | ORAL | Status: DC

## 2012-08-09 NOTE — Telephone Encounter (Signed)
Faxed refill request received from pharmacy for MICARDIS Last filled by MD on 04/08/12 Last filled by MD on 11/04/11, 90 x 0  Last seen on 11/04/11  Follow up in 6 months, no appt in [facility]  RX sent.

## 2012-10-30 ENCOUNTER — Encounter: Payer: Self-pay | Admitting: Family Medicine

## 2012-10-30 NOTE — Telephone Encounter (Signed)
Please call pt and ask if he has been contacted by [facility] for f/u colonoscopy (2007 colonoscopy w/polypectomy, procedure note said repeat in 5 yrs). If not, tell him he needs to contact them to schedule f/u for this b/c he is OVERDUE.-thx

## 2012-10-31 NOTE — Telephone Encounter (Signed)
PC to patient.  He has not had phone call from [facility].  Advised he is overdue.  Contact info given and he will call their office.  Advised he is also due for follow up in our office as well.  He will call to schedule.

## 2012-11-22 ENCOUNTER — Other Ambulatory Visit: Payer: Self-pay | Admitting: *Deleted

## 2012-11-22 DIAGNOSIS — I10 Essential (primary) hypertension: Secondary | ICD-10-CM

## 2012-11-22 DIAGNOSIS — Z0000 Encounter for general adult medical examination without abnormal findings: Secondary | ICD-10-CM

## 2012-11-22 MED ORDER — TELMISARTAN 40 MG PO TABS: 40.0000 mg | ORAL_TABLET | Freq: Every day | ORAL | Status: DC

## 2012-11-22 NOTE — Telephone Encounter (Signed)
Faxed refill request received from pharmacy for MICARDIS Last filled by MD on 08/09/12, #90 X 0 Last seen on 11/04/11  Follow up in 6 months, no appt in [facility]  RX x 1 sent to pharmacy. PC to pt.  Advised he is overdue for OV.  CPE is scheduled for 12/12/12.  Pt will come for labs on 12/09/12.  Orders entered.

## 2012-12-09 ENCOUNTER — Other Ambulatory Visit (INDEPENDENT_AMBULATORY_CARE_PROVIDER_SITE_OTHER): Payer: BC Managed Care – PPO

## 2012-12-09 DIAGNOSIS — Z0000 Encounter for general adult medical examination without abnormal findings: Secondary | ICD-10-CM

## 2012-12-09 LAB — PSA: PSA: 0.53 ng/mL (ref 0.10–4.00)

## 2012-12-09 LAB — COMPREHENSIVE METABOLIC PANEL
AST: 24 U/L (ref 0–37)
BUN: 17 mg/dL (ref 6–23)
CO2: 28 mEq/L (ref 19–32)
Chloride: 97 mEq/L (ref 96–112)
Creatinine, Ser: 1.3 mg/dL (ref 0.4–1.5)
Potassium: 4.8 mEq/L (ref 3.5–5.1)
Total Protein: 6.7 g/dL (ref 6.0–8.3)

## 2012-12-09 LAB — CBC WITH DIFFERENTIAL/PLATELET
Basophils Relative: 0.3 % (ref 0.0–3.0)
Eosinophils Absolute: 0.1 10*3/uL (ref 0.0–0.7)
Eosinophils Relative: 3.8 % (ref 0.0–5.0)
HCT: 46.3 % (ref 39.0–52.0)
Lymphs Abs: 1.7 10*3/uL (ref 0.7–4.0)
MCHC: 34.8 g/dL (ref 30.0–36.0)
MCV: 92.8 fl (ref 78.0–100.0)
Monocytes Absolute: 0.4 10*3/uL (ref 0.1–1.0)
Platelets: 177 10*3/uL (ref 150.0–400.0)
WBC: 3.9 10*3/uL — ABNORMAL LOW (ref 4.5–10.5)

## 2012-12-09 LAB — LIPID PANEL
Cholesterol: 227 mg/dL — ABNORMAL HIGH (ref 0–200)
Triglycerides: 172 mg/dL — ABNORMAL HIGH (ref 0.0–149.0)

## 2012-12-09 LAB — TSH: TSH: 0.84 u[IU]/mL (ref 0.35–5.50)

## 2012-12-09 NOTE — Progress Notes (Unsigned)
Labs only

## 2012-12-12 ENCOUNTER — Encounter: Payer: Self-pay | Admitting: Family Medicine

## 2012-12-12 ENCOUNTER — Ambulatory Visit (INDEPENDENT_AMBULATORY_CARE_PROVIDER_SITE_OTHER): Payer: BC Managed Care – PPO | Admitting: Family Medicine

## 2012-12-12 VITALS — BP 126/81 | HR 69 | Ht 65.5 in | Wt 157.0 lb

## 2012-12-12 DIAGNOSIS — Z7189 Other specified counseling: Secondary | ICD-10-CM | POA: Insufficient documentation

## 2012-12-12 DIAGNOSIS — E785 Hyperlipidemia, unspecified: Secondary | ICD-10-CM

## 2012-12-12 DIAGNOSIS — Z0000 Encounter for general adult medical examination without abnormal findings: Secondary | ICD-10-CM

## 2012-12-12 LAB — LDL CHOLESTEROL, DIRECT: Direct LDL: 157 mg/dL

## 2012-12-12 MED ORDER — ATORVASTATIN CALCIUM 10 MG PO TABS: 10.0000 mg | ORAL_TABLET | Freq: Every day | ORAL | Status: DC

## 2012-12-12 NOTE — Patient Instructions (Signed)
Call [phone] [facility]).  The MD that did your 1st colonoscopy was Dr. Charolett Bumpers.

## 2012-12-14 ENCOUNTER — Encounter: Payer: Self-pay | Admitting: Family Medicine

## 2012-12-14 NOTE — Progress Notes (Signed)
Office Note 12/14/2012  CC:  Chief Complaint  Patient presents with  . Annual Exam    HPI:  Thomas Spencer is a 47 y.o. Black male who is here for CPE.  We reviewed all of his recent labs in detail today, with focused discussion regarding mixed hyperlipidemia. He has no acute complaints.  Past Medical History  Diagnosis Date  . Hypertension   . BPH with obstruction/lower urinary tract symptoms     [facility] referral done 01/[facility] (Dr. Isabel Caprice); ultimately a prostate biopsy was done for increased PSA velocity--April 2013--and it  was completely normal.  . Family history of colon cancer     father  . Hyperlipidemia   . Elevated liver function tests     mild  . Family history of premature coronary heart disease     strong family history  . Family history of hypertension     strong family history  . Hx of colonic polyp 2007    cannot find any path report--recall 2012    Past Surgical History  Procedure Laterality Date  . Dental implants    . Colonoscopy w/ polypectomy  09/02/06    [facility]--repeat 5 yrs  . Prostate biopsy  01/2012    Normal    Family History  Problem Relation Age of Onset  . Heart attack Mother   . Heart disease Mother   . Hyperlipidemia Mother   . Hypertension Mother   . Cancer Father     colon  . Cancer Sister     cervical/ remission  . Heart disease Brother   . Hyperlipidemia Brother   . Hypertension Brother   . Diabetes Sister     type 2  . Hypertension Sister   . Hypertension Sister   . Diabetes Sister     type 2    History   Social History  . Marital Status: Married    Spouse Name: N/A    Number of Children: N/A  . Years of Education: N/A   Occupational History  . Not on file.   Social History Main Topics  . Smoking status: Never Smoker   . Smokeless tobacco: Never Used  . Alcohol Use: Yes     Comment: wine- socially  . Drug Use: No  . Sexually Active: Yes -- Male partner(s)   Other Topics Concern  . Not on  file   Social History Narrative   SH: Married, one child.     He is a Engineer, materials, lives in [place], [place].   No Tobacco, rare ETOH, no drug use.   Exercise occasionally.             Outpatient Prescriptions Prior to Visit  Medication Sig Dispense Refill  . aspirin 81 MG tablet Take 81 mg by mouth daily.        . Multiple Vitamin (MULTIVITAMIN) tablet Take 1 tablet by mouth daily.        Marland Kitchen telmisartan (MICARDIS) 40 MG tablet Take 1 tablet (40 mg total) by mouth daily.  90 tablet  0  . desloratadine (CLARINEX) 5 MG tablet Take 1 tablet (5 mg total) by mouth daily.  30 tablet  6  . fluticasone (FLONASE) 50 MCG/ACT nasal spray Place 2 sprays into the nose daily.  16 g  6  . Omega-3 Fatty Acids (FISH OIL) 1000 MG CAPS Take 2 capsules by mouth daily.        No facility-administered medications prior to visit.    Allergies  Allergen Reactions  . Ace Inhibitors Cough    ROS Review of Systems  Constitutional: Negative for fever, chills, appetite change and fatigue.  HENT: Negative for ear pain, congestion, sore throat, neck stiffness and dental problem.   Eyes: Negative for discharge, redness and visual disturbance.  Respiratory: Negative for cough, chest tightness, shortness of breath and wheezing.   Cardiovascular: Negative for chest pain, palpitations and leg swelling.  Gastrointestinal: Negative for nausea, vomiting, abdominal pain, diarrhea and blood in stool.  Genitourinary: Negative for dysuria, urgency, frequency, hematuria, flank pain and difficulty urinating.  Musculoskeletal: Negative for myalgias, back pain, joint swelling and arthralgias.  Skin: Negative for pallor and rash.  Neurological: Negative for dizziness, speech difficulty, weakness and headaches.  Hematological: Negative for adenopathy. Does not bruise/bleed easily.  Psychiatric/Behavioral: Negative for confusion and sleep disturbance. The patient is not nervous/anxious.     PE; Blood pressure 126/81,  pulse 69, height 5' 5.5" (1.664 m), weight 157 lb (71.215 kg). Gen: Alert, well appearing.  Patient is oriented to person, place, time, and situation. AFFECT: pleasant, lucid thought and speech. ENT: Ears: EACs clear, normal epithelium.  TMs with good light reflex and landmarks bilaterally.  Eyes: no injection, icteris, swelling, or exudate.  EOMI, PERRLA. Nose: no drainage or turbinate edema/swelling.  No injection or focal lesion.  Mouth: lips without lesion/swelling.  Oral mucosa pink and moist.  Dentition intact and without obvious caries or gingival swelling.  Oropharynx without erythema, exudate, or swelling.  Neck: supple/nontender.  No LAD, mass, or TM.  Carotid pulses 2+ bilaterally, without bruits. CV: RRR, no m/r/g.   LUNGS: CTA bilat, nonlabored resps, good aeration in all lung fields. ABD: soft, NT, ND, BS normal.  No hepatospenomegaly or mass.  No bruits. EXT: no clubbing, cyanosis, or edema.  Musculoskeletal: no joint swelling, erythema, warmth, or tenderness.  ROM of all joints intact. Skin - no sores or suspicious lesions or rashes or color changes Genitals normal; both testes normal without tenderness, masses, hydroceles, varicoceles, erythema or swelling. Shaft normal, uncircumcised, meatus normal without discharge. No inguinal hernia noted. No inguinal lymphadenopathy. Rectal exam: negative without mass, lesions or tenderness, PROSTATE EXAM: smooth and symmetric without nodules or tenderness.  Pertinent labs:  Lab Results  Component Value Date   TSH 0.84 12/09/2012   Lab Results  Component Value Date   WBC 3.9* 12/09/2012   HGB 16.1 12/09/2012   HCT 46.3 12/09/2012   MCV 92.8 12/09/2012   PLT 177.0 12/09/2012   Lab Results  Component Value Date   CREATININE 1.3 12/09/2012   BUN 17 12/09/2012   NA 132* 12/09/2012   K 4.8 12/09/2012   CL 97 12/09/2012   CO2 28 12/09/2012   Lab Results  Component Value Date   ALT 34 12/09/2012   AST 24 12/09/2012   ALKPHOS 56 12/09/2012    BILITOT 0.9 12/09/2012   Lab Results  Component Value Date   CHOL 227* 12/09/2012   Lab Results  Component Value Date   HDL 29.30* 12/09/2012   Lab Results  Component Value Date   LDLCALC 118* 05/23/2010   Lab Results  Component Value Date   TRIG 172.0* 12/09/2012   Lab Results  Component Value Date   CHOLHDL 8 12/09/2012   Lab Results  Component Value Date   PSA 0.53 12/09/2012   PSA 1.74 11/04/2011   PSA 0.47 05/23/2010    ASSESSMENT AND PLAN:   Health maintenance examination Reviewed age and gender appropriate health maintenance issues (  prudent diet, regular exercise, health risks of tobacco and excessive alcohol, use of seatbelts, fire alarms in home, use of sunscreen).  Also reviewed age and gender appropriate health screening as well as vaccine recommendations. He was given the phone number to contact [facility] so he could arrange f/u for repeat colonoscopy for hx of adenomatous colon polyps.  HYPERLIPIDEMIA Trial of atorvastatin 20mg  once daily. Therapeutic expectations and side effect profile of medication discussed today.  Patient's questions answered.   An After Visit Summary was printed and given to the patient.  FOLLOW UP:  Return in about 2 months (around 02/11/2013) for o/v + fasting lipid panel (f/u hyperlipidemia).

## 2012-12-14 NOTE — Assessment & Plan Note (Addendum)
Reviewed age and gender appropriate health maintenance issues (prudent diet, regular exercise, health risks of tobacco and excessive alcohol, use of seatbelts, fire alarms in home, use of sunscreen).  Also reviewed age and gender appropriate health screening as well as vaccine recommendations. He was given the phone number to contact [facility] so he could arrange f/u for repeat colonoscopy for hx of adenomatous colon polyps.

## 2012-12-14 NOTE — Assessment & Plan Note (Signed)
Trial of atorvastatin 20mg  once daily. Therapeutic expectations and side effect profile of medication discussed today.  Patient's questions answered.

## 2012-12-16 ENCOUNTER — Other Ambulatory Visit: Payer: Self-pay | Admitting: *Deleted

## 2012-12-16 NOTE — Telephone Encounter (Signed)
Request for 90 day supply of ATORVASTATIN from [facility].  Pt just started on this med.  Dr. Milinda Cave recommends patient try for a couple of months to make sure this is the right cholesterol lowering medication for him.  Advised pt of this.  He is agreeable.  He will call after first month if tolerating well.

## 2013-02-08 ENCOUNTER — Ambulatory Visit (INDEPENDENT_AMBULATORY_CARE_PROVIDER_SITE_OTHER): Payer: BC Managed Care – PPO | Admitting: Family Medicine

## 2013-02-08 ENCOUNTER — Encounter: Payer: Self-pay | Admitting: Family Medicine

## 2013-02-08 VITALS — BP 110/74 | HR 74 | Temp 98.1°F | Resp 16 | Wt 160.2 lb

## 2013-02-08 DIAGNOSIS — E785 Hyperlipidemia, unspecified: Secondary | ICD-10-CM

## 2013-02-08 MED ORDER — SIMVASTATIN 20 MG PO TABS: 20.0000 mg | ORAL_TABLET | Freq: Every day | ORAL | Status: DC

## 2013-02-08 NOTE — Progress Notes (Signed)
OFFICE NOTE  02/08/2013  CC:  Chief Complaint  Patient presents with  . Follow-up    2-mth. [Cholesterol Medication]     HPI: Patient is a 47 y.o. African-American male who is here for 2 mo f/u hyperlipidemia.  Last visit we started atorvastatin 10mg  qd. He noted after about 1-2 wks of taking this that he had some daily nagging HAs and some peripheral vision vague changes. He stopped the med about a week ago and already feels nearly back to his normal baseline.  Pertinent PMH:  Past Medical History  Diagnosis Date  . Hypertension   . BPH with obstruction/lower urinary tract symptoms     [facility] referral done 01/[facility] (Dr. Isabel Caprice); ultimately a prostate biopsy was done for increased PSA velocity--April 2013--and it  was completely normal.  . Family history of colon cancer     father  . Hyperlipidemia   . Elevated liver function tests     mild  . Family history of premature coronary heart disease     strong family history  . Family history of hypertension     strong family history  . Hx of colonic polyp 2007    cannot find any path report--recall 2012    MEDS:  Outpatient Prescriptions Prior to Visit  Medication Sig Dispense Refill  . aspirin 81 MG tablet Take 81 mg by mouth daily.        . Multiple Vitamin (MULTIVITAMIN) tablet Take 1 tablet by mouth daily.        Marland Kitchen telmisartan (MICARDIS) 40 MG tablet Take 1 tablet (40 mg total) by mouth daily.  90 tablet  0  . atorvastatin (LIPITOR) 10 MG tablet Take 1 tablet (10 mg total) by mouth daily.  30 tablet  2   No facility-administered medications prior to visit.    PE: Blood pressure 110/74, pulse 74, temperature 98.1 F (36.7 C), temperature source Oral, resp. rate 16, weight 160 lb 4 oz (72.689 kg), SpO2 98.00%. Gen: Alert, well appearing.  Patient is oriented to person, place, time, and situation. No further exam today.  IMPRESSION AND PLAN:  HYPERLIPIDEMIA Did not tolerate atorvastatin. Will do trial of  simvastatin 20mg  qd.     FOLLOW UP: lab visit in 2 mo for FLP and AST/ALT.  O/V prn for now.

## 2013-02-08 NOTE — Assessment & Plan Note (Signed)
Did not tolerate atorvastatin. Will do trial of simvastatin 20mg  qd.

## 2013-03-08 ENCOUNTER — Telehealth: Payer: Self-pay | Admitting: Family Medicine

## 2013-03-08 DIAGNOSIS — I10 Essential (primary) hypertension: Secondary | ICD-10-CM

## 2013-03-08 DIAGNOSIS — Z0000 Encounter for general adult medical examination without abnormal findings: Secondary | ICD-10-CM

## 2013-03-08 MED ORDER — TELMISARTAN 40 MG PO TABS: 40.0000 mg | ORAL_TABLET | Freq: Every day | ORAL | Status: DC

## 2013-03-08 NOTE — Telephone Encounter (Signed)
Sent 30 day supply to [facility] per pt wishes.

## 2013-04-05 ENCOUNTER — Encounter: Payer: Self-pay | Admitting: Family Medicine

## 2013-04-05 ENCOUNTER — Ambulatory Visit (INDEPENDENT_AMBULATORY_CARE_PROVIDER_SITE_OTHER): Payer: BC Managed Care – PPO | Admitting: Family Medicine

## 2013-04-05 VITALS — BP 143/88 | HR 60 | Temp 98.0°F | Resp 16 | Ht 65.5 in | Wt 164.0 lb

## 2013-04-05 DIAGNOSIS — E785 Hyperlipidemia, unspecified: Secondary | ICD-10-CM

## 2013-04-05 MED ORDER — ROSUVASTATIN CALCIUM 20 MG PO TABS: 20.0000 mg | ORAL_TABLET | Freq: Every day | ORAL | Status: DC

## 2013-04-05 NOTE — Progress Notes (Signed)
OFFICE NOTE  04/05/2013  CC:  Chief Complaint  Patient presents with  . Follow-up    stopped simvastatin     HPI: Patient is a 47 y.o. African-American male who is here for f/u statin use for hyperlipidemia. Atorv caused HA's and vision changes. Simva started 02/08/13 caused same sx's but took a bit longer--maybe 2-3 wks before sx's started this time.  Since stopping the med he has felt all sx's go away. No myalgias or fatigue.  Pertinent PMH:  Past Medical History  Diagnosis Date  . Hypertension   . BPH with obstruction/lower urinary tract symptoms     [facility] referral done 01/2011 (Dr. Isabel Caprice); ultimately a prostate biopsy was done for increased PSA velocity--April 2013--and it  was completely normal.  . Family history of colon cancer     father  . Hyperlipidemia   . Elevated liver function tests     mild  . Family history of premature coronary heart disease     strong family history  . Family history of hypertension     strong family history  . Hx of colonic polyp 2007    cannot find any path report--recall 2012   Past surgical, social, and family history reviewed and no changes noted since last office visit.  MEDS:  Outpatient Prescriptions Prior to Visit  Medication Sig Dispense Refill  . aspirin 81 MG tablet Take 81 mg by mouth daily.        . Multiple Vitamin (MULTIVITAMIN) tablet Take 1 tablet by mouth daily.        Marland Kitchen telmisartan (MICARDIS) 40 MG tablet Take 1 tablet (40 mg total) by mouth daily.  30 tablet  0  . simvastatin (ZOCOR) 20 MG tablet Take 1 tablet (20 mg total) by mouth at bedtime.  30 tablet  2   No facility-administered medications prior to visit.    PE: Blood pressure 143/88, pulse 60, temperature 98 F (36.7 C), temperature source Temporal, resp. rate 16, height 5' 5.5" (1.664 m), weight 164 lb (74.39 kg), SpO2 97.00%. Gen: Alert, well appearing.  Patient is oriented to person, place, time, and situation. AFFECT: pleasant, lucid  thought and speech. No further exam today.  LABS: none today Lab Results  Component Value Date   CHOL 227* 12/09/2012   HDL 29.30* 12/09/2012   LDLCALC 118* 05/23/2010   LDLDIRECT 157.0 12/09/2012   TRIG 172.0* 12/09/2012   CHOLHDL 8 12/09/2012     IMPRESSION AND PLAN:  HYPERLIPIDEMIA Mild/mod side effects from both atorv and simva. Will do trial of crestor 20mg  qd --2 months of samples given to pt today.   An After Visit Summary was printed and given to the patient.  FOLLOW UP: 2 mo for morning appt/fasting lipid panel and med recheck.

## 2013-04-05 NOTE — Assessment & Plan Note (Signed)
Mild/mod side effects from both atorv and simva. Will do trial of crestor 20mg  qd --2 months of samples given to pt today.

## 2013-04-12 ENCOUNTER — Other Ambulatory Visit: Payer: BC Managed Care – PPO

## 2013-04-14 ENCOUNTER — Other Ambulatory Visit: Payer: Self-pay | Admitting: Family Medicine

## 2013-04-14 DIAGNOSIS — I10 Essential (primary) hypertension: Secondary | ICD-10-CM

## 2013-04-14 DIAGNOSIS — Z0000 Encounter for general adult medical examination without abnormal findings: Secondary | ICD-10-CM

## 2013-04-14 MED ORDER — TELMISARTAN 40 MG PO TABS: 40.0000 mg | ORAL_TABLET | Freq: Every day | ORAL | Status: DC

## 2013-04-20 ENCOUNTER — Other Ambulatory Visit: Payer: Self-pay | Admitting: Family Medicine

## 2013-04-20 DIAGNOSIS — I10 Essential (primary) hypertension: Secondary | ICD-10-CM

## 2013-04-20 DIAGNOSIS — Z0000 Encounter for general adult medical examination without abnormal findings: Secondary | ICD-10-CM

## 2013-04-20 MED ORDER — TELMISARTAN 40 MG PO TABS: 40.0000 mg | ORAL_TABLET | Freq: Every day | ORAL | Status: DC

## 2013-06-16 ENCOUNTER — Telehealth: Payer: Self-pay | Admitting: Family Medicine

## 2013-06-16 MED ORDER — ROSUVASTATIN CALCIUM 20 MG PO TABS: 20.0000 mg | ORAL_TABLET | Freq: Every day | ORAL | Status: DC

## 2013-06-16 NOTE — Telephone Encounter (Signed)
Patient called requesting samples of crestor 20mg .  I gave him 45 days worth.  Lot # [id number].

## 2013-07-26 ENCOUNTER — Telehealth: Payer: Self-pay | Admitting: Family Medicine

## 2013-07-26 NOTE — Telephone Encounter (Signed)
Patient is requesting to have blood drawn at [facility] Friday 07/28/13 to test his cholesterol. Please call patient to advise.

## 2013-07-27 ENCOUNTER — Other Ambulatory Visit: Payer: Self-pay | Admitting: Family Medicine

## 2013-07-27 DIAGNOSIS — E782 Mixed hyperlipidemia: Secondary | ICD-10-CM

## 2013-07-27 NOTE — Telephone Encounter (Signed)
Orders entered for FLP.

## 2013-07-27 NOTE — Telephone Encounter (Signed)
Please advise 

## 2013-08-01 ENCOUNTER — Other Ambulatory Visit: Payer: Self-pay | Admitting: *Deleted

## 2013-08-01 DIAGNOSIS — E782 Mixed hyperlipidemia: Secondary | ICD-10-CM

## 2013-08-01 NOTE — Telephone Encounter (Signed)
Patient is going to the [facility] to get his blood drawn. He has made an appt for OV 08/23/13. Can he pick up samples of Crestor 20 MG tomorrow or Thursday? He just needs enough to make it to his appt.

## 2013-08-02 NOTE — Telephone Encounter (Signed)
Patient can have samples of crestor 20mg .  Patient aware samples are at front desk.

## 2013-08-15 ENCOUNTER — Other Ambulatory Visit: Payer: Self-pay | Admitting: Family Medicine

## 2013-08-15 LAB — LIPID PANEL
Cholesterol: 129 mg/dL (ref 0–200)
HDL: 29 mg/dL — ABNORMAL LOW (ref >39)
LDL Cholesterol: 61 mg/dL (ref 0–99)
Total CHOL/HDL Ratio: 4.4 Ratio
Triglycerides: 193 mg/dL — ABNORMAL HIGH (ref <150)

## 2013-08-23 ENCOUNTER — Ambulatory Visit: Payer: BC Managed Care – PPO | Admitting: Family Medicine

## 2013-08-23 ENCOUNTER — Encounter: Payer: Self-pay | Admitting: Family Medicine

## 2013-08-28 ENCOUNTER — Ambulatory Visit: Payer: Self-pay | Admitting: Podiatry

## 2013-09-01 ENCOUNTER — Encounter: Payer: Self-pay | Admitting: Family Medicine

## 2013-09-01 ENCOUNTER — Ambulatory Visit (INDEPENDENT_AMBULATORY_CARE_PROVIDER_SITE_OTHER): Payer: BC Managed Care – PPO | Admitting: Family Medicine

## 2013-09-01 VITALS — BP 125/87 | HR 67 | Temp 97.5°F | Resp 18 | Ht 65.5 in | Wt 167.0 lb

## 2013-09-01 DIAGNOSIS — E782 Mixed hyperlipidemia: Secondary | ICD-10-CM

## 2013-09-01 NOTE — Progress Notes (Signed)
Pre visit review using our clinic review tool, if applicable. No additional management support is needed unless otherwise documented below in the visit note. 

## 2013-09-01 NOTE — Progress Notes (Signed)
Pt rescheduled.   I'll call him to discuss his cholesterol med issues.

## 2013-09-04 ENCOUNTER — Telehealth: Payer: Self-pay | Admitting: Family Medicine

## 2013-09-04 MED ORDER — ROSUVASTATIN CALCIUM 10 MG PO TABS: 10.0000 mg | ORAL_TABLET | Freq: Every day | ORAL | Status: DC

## 2013-09-04 NOTE — Telephone Encounter (Signed)
Called pt, discussed sx's he was having while on crestor 20mg  daily: diffuse myalgias/arthralgias. Stopped med and sx's have nearly completely resolved. Discussed options, decided to try lower dose crestor (10mg  qd).   Rx sent to pharmacy today.

## 2013-09-05 ENCOUNTER — Encounter: Payer: Self-pay | Admitting: Podiatry

## 2013-09-05 ENCOUNTER — Ambulatory Visit (INDEPENDENT_AMBULATORY_CARE_PROVIDER_SITE_OTHER): Payer: BC Managed Care – PPO | Admitting: Podiatry

## 2013-09-05 VITALS — BP 139/79 | HR 79 | Resp 15 | Ht 68.0 in | Wt 165.0 lb

## 2013-09-05 DIAGNOSIS — Q6650 Congenital pes planus, unspecified foot: Secondary | ICD-10-CM

## 2013-09-05 NOTE — Progress Notes (Signed)
Pt states his old orthotics feel better than the new ones and he has gone back into wearing the old.  Pt states you can definitely see a difference in the old and the new. Assistant his orthotics back and have a medial flange added and I will followup with him with this come in.

## 2013-09-11 ENCOUNTER — Other Ambulatory Visit: Payer: Self-pay

## 2013-11-12 DIAGNOSIS — Z8669 Personal history of other diseases of the nervous system and sense organs: Secondary | ICD-10-CM

## 2013-11-12 HISTORY — DX: Personal history of other diseases of the nervous system and sense organs: Z86.69

## 2013-11-13 ENCOUNTER — Ambulatory Visit (INDEPENDENT_AMBULATORY_CARE_PROVIDER_SITE_OTHER): Payer: BC Managed Care – PPO | Admitting: Family Medicine

## 2013-11-13 ENCOUNTER — Encounter: Payer: Self-pay | Admitting: Family Medicine

## 2013-11-13 VITALS — BP 121/79 | HR 72 | Temp 97.5°F | Resp 18 | Ht 65.5 in | Wt 168.0 lb

## 2013-11-13 DIAGNOSIS — M5430 Sciatica, unspecified side: Secondary | ICD-10-CM

## 2013-11-13 DIAGNOSIS — IMO0001 Reserved for inherently not codable concepts without codable children: Secondary | ICD-10-CM

## 2013-11-13 DIAGNOSIS — S6390XA Sprain of unspecified part of unspecified wrist and hand, initial encounter: Secondary | ICD-10-CM

## 2013-11-13 DIAGNOSIS — M5431 Sciatica, right side: Secondary | ICD-10-CM | POA: Insufficient documentation

## 2013-11-13 MED ORDER — PREDNISONE 20 MG PO TABS: ORAL_TABLET | ORAL | Status: DC

## 2013-11-13 NOTE — Assessment & Plan Note (Signed)
Check x-ray to r/o fracture today. Gentle ROM, ice, NSAIDs prn.

## 2013-11-13 NOTE — Progress Notes (Signed)
OFFICE NOTE  11/13/2013  CC:  Chief Complaint  Patient presents with  . Back Pain    a couple months  . Hand Pain    x 3 weeks     HPI: Patient is a 48 y.o. African-American male who is here for back and hand complaints. About 3 wks ago fell on outstretched hands when tripped going up steps, had phone in right hand. Right hand initially swollen and painful.  Although it has improved it is still somewhat tender and mildly swollen. Took ibup for a couple days and iced it initially.  Left hand not bothering him at all.  Several month hx of tingling sensation in right foot, prob started when lifting things and bending down low at back, some back tenderness associated with "electrical" feeling shooting down right leg.  Comes intermittently, more frequent lately.  Feels like it starts in right glut and works its way down.  It is worse with more activity that involves his back.  Better with heating pad use.     Pertinent PMH:  Past medical, surgical, social, and family history reviewed and no changes are noted since last office visit.  MEDS:  Outpatient Prescriptions Prior to Visit  Medication Sig Dispense Refill  . aspirin 81 MG tablet Take 81 mg by mouth daily.        . Multiple Vitamin (MULTIVITAMIN) tablet Take 1 tablet by mouth daily.        . rosuvastatin (CRESTOR) 10 MG tablet Take 1 tablet (10 mg total) by mouth at bedtime.  30 tablet  3  . telmisartan (MICARDIS) 40 MG tablet Take 1 tablet (40 mg total) by mouth daily.  90 tablet  1   No facility-administered medications prior to visit.    PE: Blood pressure 121/79, pulse 72, temperature 97.5 F (36.4 C), temperature source Temporal, resp. rate 18, height 5' 5.5" (1.664 m), weight 168 lb (76.204 kg), SpO2 97.00%. Gen: Alert, well appearing.  Patient is oriented to person, place, time, and situation. Back: ROM fully intact but L-spine forward flexion does elicit a tightness/pulling sensation in right gluteal region. NO back  tenderness.  Mild discomfort to right glut region on palpation but no focal tenderness over the ischial tuberosity.  Sitting SLR neg on left.  On the right this elicits tightness/pulling in right gluteal region at 45 deg of extension.   LE strength 5/5 prox/dist bilat.  Diminished sensation on back of right ankle and bottom of right foot. DTRs 1+ in patellar areas bilat, trace bilat in achilles areas. Right hand: 3rd MCP jt TTP and mild swelling over dorsal surface of 3rd MCP jt.  Mild extenstion of the tenderness is palpable over distal portion of this metacarpal and proximal portion of proximal phalanx. No erythema or warmth.  Flexion/extension of all fingers of both hands fully intact.  Wrists with fulll ROM and w/out any tenderness.  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    Sprain of hand, third finger, right Check x-ray to r/o fracture today. Gentle ROM, ice, NSAIDs prn.   An After Visit Summary was printed and given to the patient.  FOLLOW UP: 2 wks

## 2013-11-13 NOTE — Progress Notes (Signed)
Pre visit review using our clinic review tool, if applicable. No additional management support is needed unless otherwise documented below in the visit note. 

## 2013-11-13 NOTE — Assessment & Plan Note (Signed)
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

## 2013-11-17 ENCOUNTER — Other Ambulatory Visit: Payer: Self-pay | Admitting: Family Medicine

## 2013-11-17 DIAGNOSIS — I10 Essential (primary) hypertension: Secondary | ICD-10-CM

## 2013-11-17 DIAGNOSIS — Z0000 Encounter for general adult medical examination without abnormal findings: Secondary | ICD-10-CM

## 2013-11-17 MED ORDER — ROSUVASTATIN CALCIUM 10 MG PO TABS: 10.0000 mg | ORAL_TABLET | Freq: Every day | ORAL | Status: DC

## 2013-11-17 MED ORDER — TELMISARTAN 40 MG PO TABS: 40.0000 mg | ORAL_TABLET | Freq: Every day | ORAL | Status: DC

## 2013-12-08 ENCOUNTER — Ambulatory Visit (HOSPITAL_BASED_OUTPATIENT_CLINIC_OR_DEPARTMENT_OTHER)
Admission: RE | Admit: 2013-12-08 | Discharge: 2013-12-08 | Disposition: A | Discharge status: Home / Self Care | Payer: BC Managed Care – PPO | Source: Ambulatory Visit | Attending: Family Medicine | Admitting: Family Medicine

## 2013-12-08 DIAGNOSIS — IMO0001 Reserved for inherently not codable concepts without codable children: Secondary | ICD-10-CM

## 2013-12-08 DIAGNOSIS — M79609 Pain in unspecified limb: Secondary | ICD-10-CM | POA: Insufficient documentation

## 2013-12-14 ENCOUNTER — Encounter: Payer: Self-pay | Admitting: Podiatry

## 2013-12-28 ENCOUNTER — Encounter: Payer: Self-pay | Admitting: Family Medicine

## 2013-12-28 ENCOUNTER — Ambulatory Visit (INDEPENDENT_AMBULATORY_CARE_PROVIDER_SITE_OTHER): Payer: BC Managed Care – PPO | Admitting: Family Medicine

## 2013-12-28 VITALS — BP 133/84 | HR 78 | Temp 98.0°F | Resp 18 | Ht 65.5 in | Wt 169.0 lb

## 2013-12-28 DIAGNOSIS — R059 Cough, unspecified: Secondary | ICD-10-CM

## 2013-12-28 DIAGNOSIS — R05 Cough: Secondary | ICD-10-CM

## 2013-12-28 DIAGNOSIS — IMO0001 Reserved for inherently not codable concepts without codable children: Secondary | ICD-10-CM

## 2013-12-28 DIAGNOSIS — J209 Acute bronchitis, unspecified: Secondary | ICD-10-CM

## 2013-12-28 DIAGNOSIS — S6390XA Sprain of unspecified part of unspecified wrist and hand, initial encounter: Secondary | ICD-10-CM

## 2013-12-28 DIAGNOSIS — R058 Other specified cough: Secondary | ICD-10-CM

## 2013-12-28 MED ORDER — METHYLPREDNISOLONE ACETATE 80 MG/ML IJ SUSP
80.0000 mg | Freq: Once | INTRAMUSCULAR | Status: AC
  Administered 2013-12-28: 80 mg via INTRAMUSCULAR

## 2013-12-28 MED ORDER — FEXOFENADINE HCL 180 MG PO TABS: 180.0000 mg | ORAL_TABLET | Freq: Every day | ORAL | Status: DC

## 2013-12-28 NOTE — Progress Notes (Signed)
OFFICE NOTE  12/28/2013  CC:  Chief Complaint  Patient presents with  . Cough    x 2 months  . chest congestion  . Follow-up    hand      HPI: Patient is a 48 y.o. African-American male who is here for 1) Hurt finger  2) cough.  Right middle finger MTP joint still sore when he flexes and extends it, still sometimes swells a little.  No pain at rest.   X-ray of finger in not-to-distant past has been normal.  He has tried no meds.    Of note, the sciatica he had last visit improved significantly with oral steroid burst.  Regarding cough, he reports about a 2 mo hx of cough, started out with nasal congestion/runny nose, some cough. URI sx's resolved, cough is lingering.  NO wheezing, no fever.  Some cough with trying to talk much, also feels some tickle in back of throat.  No ST.  Cough in daytime and nighttime.  Denies GERD.  Pertinent PMH:  Past medical, surgical, social, and family history reviewed and no changes are noted since last office visit.  MEDS:  Outpatient Prescriptions Prior to Visit  Medication Sig Dispense Refill  . aspirin 81 MG tablet Take 81 mg by mouth daily.        . Multiple Vitamin (MULTIVITAMIN) tablet Take 1 tablet by mouth daily.        . rosuvastatin (CRESTOR) 10 MG tablet Take 1 tablet (10 mg total) by mouth at bedtime.  90 tablet  1  . telmisartan (MICARDIS) 40 MG tablet Take 1 tablet (40 mg total) by mouth daily.  90 tablet  1  . predniSONE (DELTASONE) 20 MG tablet 2 tabs po qd x 5d  10 tablet  0   No facility-administered medications prior to visit.    PE: Blood pressure 133/84, pulse 78, temperature 98 F (36.7 C), temperature source Temporal, resp. rate 18, height 5' 5.5" (1.664 m), weight 169 lb (76.658 kg), SpO2 97.00%. VS: noted--normal. Gen: alert, NAD, NONTOXIC APPEARING. HEENT: eyes without injection, drainage, or swelling.  Ears: EACs clear, TMs with normal light reflex and landmarks.  Nose: Clear rhinorrhea, with some dried, crusty  exudate adherent to mildly injected mucosa.  No purulent d/c.  No paranasal sinus TTP.  No facial swelling.  Throat and mouth without focal lesion.  No pharyngial swelling, erythema, or exudate.   Neck: supple, no LAD.   LUNGS: CTA bilat, nonlabored resps.   CV: RRR, no m/r/g. EXT: no c/c/e   Right hand, middle finger MTP with mild swelling, minimal TTP, no pain with ROM and no sign of instability. SKIN: no rash   IMPRESSION AND PLAN:  1) Upper airway cough syndrome: Depo-medrol 80mg  IM.  Allegra 180 mg daily.  Nasal saline spray tid. Pantoprazole 40mg  qd.  2) Residual right middle finger MTP soft tissue swelling&tenderness.  X-ray normal in recent past. Reassured pt, watchful waiting approach agreed upon.  An After Visit Summary was printed and given to the patient.  FOLLOW UP: prn

## 2013-12-28 NOTE — Patient Instructions (Signed)
Take one dexilant 60 mg capsule every morning. Use OTC generic saline nasal spray: 2 -3 sprays each nostril 3 times per day to irrigate/moisturize nasal passages.

## 2014-01-12 DIAGNOSIS — J0510 Acute epiglottitis without obstruction: Secondary | ICD-10-CM

## 2014-01-12 HISTORY — DX: Acute epiglottitis without obstruction: J05.10

## 2014-01-23 ENCOUNTER — Ambulatory Visit: Payer: BC Managed Care – PPO | Admitting: Podiatry

## 2014-01-31 ENCOUNTER — Ambulatory Visit (INDEPENDENT_AMBULATORY_CARE_PROVIDER_SITE_OTHER): Payer: BC Managed Care – PPO | Admitting: Nurse Practitioner

## 2014-01-31 ENCOUNTER — Encounter: Payer: Self-pay | Admitting: Nurse Practitioner

## 2014-01-31 VITALS — BP 156/81 | HR 118 | Temp 101.7°F | Ht 65.5 in | Wt 166.0 lb

## 2014-01-31 DIAGNOSIS — IMO0001 Reserved for inherently not codable concepts without codable children: Secondary | ICD-10-CM

## 2014-01-31 DIAGNOSIS — J111 Influenza due to unidentified influenza virus with other respiratory manifestations: Secondary | ICD-10-CM

## 2014-01-31 DIAGNOSIS — M791 Myalgia, unspecified site: Secondary | ICD-10-CM

## 2014-01-31 DIAGNOSIS — J029 Acute pharyngitis, unspecified: Secondary | ICD-10-CM

## 2014-01-31 DIAGNOSIS — R509 Fever, unspecified: Secondary | ICD-10-CM

## 2014-01-31 MED ORDER — OSELTAMIVIR PHOSPHATE 75 MG PO CAPS: 75.0000 mg | ORAL_CAPSULE | Freq: Two times a day (BID) | ORAL | Status: DC

## 2014-01-31 NOTE — Progress Notes (Signed)
Pre visit review using our clinic review tool, if applicable. No additional management support is needed unless otherwise documented below in the visit note. 

## 2014-01-31 NOTE — Progress Notes (Signed)
   Subjective:    Patient ID: Thomas Spencer, male    DOB: [birth year], 48 y.o.   MRN: [id number]  HPI Comments: Mr Penley was seen last mo. For cough. Of note, last 2 CBC have revealed mildly low leukopenia. No sick contacts at home or work. He works as a Programme researcher, broadcasting/film/video.   Sore Throat  This is a new problem. The current episode started yesterday. Neither side of throat is experiencing more pain than the other. The maximum temperature recorded prior to his arrival was 101 - 101.9 F. The fever has been present for 1 to 2 days. The pain is moderate. Associated symptoms include headaches. Pertinent negatives include no abdominal pain, congestion, coughing, diarrhea, ear pain, shortness of breath, swollen glands or vomiting. He has tried NSAIDs for the symptoms. The treatment provided no relief.      Review of Systems  Constitutional: Positive for fever, chills and fatigue. Negative for activity change and appetite change.  HENT: Positive for sore throat. Negative for congestion and ear pain.   Respiratory: Negative for cough and shortness of breath.   Gastrointestinal: Negative for vomiting, abdominal pain and diarrhea.  Musculoskeletal: Negative for back pain.  Skin: Negative for rash.  Neurological: Positive for headaches.       Objective:   Physical Exam  Constitutional: He is oriented to person, place, and time. He appears well-developed and well-nourished. No distress.  HENT:  Head: Normocephalic and atraumatic.  Right Ear: External ear normal.  Left Ear: External ear normal.  Mouth/Throat: No oropharyngeal exudate.  Erythema posterior pharynx. TM vessels injected, no effusion, bones visible.  Eyes: Conjunctivae are normal. Right eye exhibits no discharge. Left eye exhibits no discharge.  Neck: Normal range of motion. Neck supple. No thyromegaly present.  Cardiovascular: Regular rhythm and normal heart sounds.   No murmur heard. tachycardia  Pulmonary/Chest: Effort normal and  breath sounds normal. No respiratory distress. He has no wheezes. He has no rales.  Abdominal: Soft. He exhibits no distension and no mass. There is no tenderness. There is no rebound and no guarding.  Lymphadenopathy:    He has cervical adenopathy.  Neurological: He is alert and oriented to person, place, and time.  Skin: Skin is warm and dry.  Psychiatric: He has a normal mood and affect. His behavior is normal. Thought content normal.          Assessment & Plan:  1. Sore throat, myalgia, fever DD: strep or viral pharyngitis including mono, flu Recent Hx cough, history mild leukopenia - Upper Respiratory Culture-pending - POCT rapid strep A-neg - CBC with Differential - Influenza a and b - oseltamivir (TAMIFLU) 75 MG capsule; Take 1 capsule (75 mg total) by mouth 2 (two) times daily.  Dispense: 10 capsule; Refill: 0

## 2014-01-31 NOTE — Patient Instructions (Addendum)
You likely have flu. Start tamiflu. The average duration is 5-10 days. Treatment is largely symptom management. For sore throat use benzocaine throat lozenges or spray. For aches & fever alternate tylenol & ibuprophen every 4-6 hours. Sip fluids every hour. Rest. If you are not feeling better in 1 week or develop fever or chest pain, call us for re-evaluation. Our office will call with lab results. Feel better!    Influenza A (H1N1) H1N1 formerly called "swine flu" is a new influenza virus causing sickness in people. The H1N1 virus is different from seasonal influenza viruses. However, the H1N1 symptoms are similar to seasonal influenza and it is spread from person to person. You may be at higher risk for serious problems if you have underlying serious medical conditions. The CDC and the [facility] are following reported cases around the world. CAUSES   The flu is thought to spread mainly person-to-person through coughing or sneezing of infected people.  A person may become infected by touching something with the virus on it and then touching their mouth or nose. SYMPTOMS   Fever.  Headache.  Tiredness.  Cough.  Sore throat.  Runny or stuffy nose.  Body aches.  Diarrhea and vomiting These symptoms are referred to as "flu-like symptoms." A lot of different illnesses, including the common cold, may have similar symptoms. DIAGNOSIS   There are tests that can tell if you have the H1N1 virus.  Confirmed cases of H1N1 will be reported to the state or local health department.  A doctor's exam may be needed to tell whether you have an infection that is a complication of the flu. HOME CARE INSTRUCTIONS   Stay informed. Visit the [facility] website for current recommendations. Visit [employer]. You may also call 1-800-CDC-INFO 250 050 1886).  Get help early if you develop any of the above symptoms.  If you are at high risk from complications of the flu, talk to  your caregiver as soon as you develop flu-like symptoms. Those at higher risk for complications include:  People 65 years or older.  People with chronic medical conditions.  Pregnant women.  Young children.  Your caregiver may recommend antiviral medicine to help treat the flu.  If you get the flu, get plenty of rest, drink enough water and fluids to keep your urine clear or pale yellow, and avoid using alcohol or tobacco.  You may take over-the-counter medicine to relieve the symptoms of the flu if your caregiver approves. (Never give aspirin to children or teenagers who have flu-like symptoms, particularly fever). TREATMENT  If you do get sick, antiviral drugs are available. These drugs can make your illness milder and make you feel better faster. Treatment should start soon after illness starts. It is only effective if taken within the first day of becoming ill. Only your caregiver can prescribe antiviral medication.  PREVENTION   Cover your nose and mouth with a tissue or your arm when you cough or sneeze. Throw the tissue away.  Wash your hands often with soap and warm water, especially after you cough or sneeze. Alcohol-based cleaners are also effective against germs.  Avoid touching your eyes, nose or mouth. This is one way germs spread.  Try to avoid contact with sick people. Follow public health advice regarding school closures. Avoid crowds.  Stay home if you get sick. Limit contact with others to keep from infecting them. People infected with the H1N1 virus may be able to infect others anywhere from 1 day before feeling sick  to 5-7 days after getting flu symptoms.  An H1N1 vaccine is available to help protect against the virus. In addition to the H1N1 vaccine, you will need to be vaccinated for seasonal influenza. The H1N1 and seasonal vaccines may be given on the same day. The CDC especially recommends the H1N1 vaccine for:  Pregnant women.  People who live with or care  for children younger than 63 months of age.  Health care and emergency services personnel.  Persons between the ages of 73 months through 37 years of age.  People from ages 50 through 42 years who are at higher risk for H1N1 because of chronic health disorders or immune system problems. FACEMASKS In community and home settings, the use of facemasks and N95 respirators are not normally recommended. In certain circumstances, a facemask or N95 respirator may be used for persons at increased risk of severe illness from influenza. Your caregiver can give additional recommendations for facemask use. IN CHILDREN, EMERGENCY WARNING SIGNS THAT NEED URGENT MEDICAL CARE:  Fast breathing or trouble breathing.  Bluish skin color.  Not drinking enough fluids.  Not waking up or not interacting normally.  Being so fussy that the child does not want to be held.  Your child has an oral temperature above 102 F (38.9 C), not controlled by medicine.  Your baby is older than 3 months with a rectal temperature of 102 F (38.9 C) or higher.  Your baby is 68 months old or younger with a rectal temperature of 100.4 F (38 C) or higher.  Flu-like symptoms improve but then return with fever and worse cough. IN ADULTS, EMERGENCY WARNING SIGNS THAT NEED URGENT MEDICAL CARE:  Difficulty breathing or shortness of breath.  Pain or pressure in the chest or abdomen.  Sudden dizziness.  Confusion.  Severe or persistent vomiting.  Bluish color.  You have a oral temperature above 102 F (38.9 C), not controlled by medicine.  Flu-like symptoms improve but return with fever and worse cough. SEEK IMMEDIATE MEDICAL CARE IF:  You or someone you know is experiencing any of the above symptoms. When you arrive at the emergency center, report that you think you have the flu. You may be asked to wear a mask and/or sit in a secluded area to protect others from getting sick. MAKE SURE YOU:   Understand these  instructions.  Will watch your condition.  Will get help right away if you are not doing well or get worse. Some of this information courtesy of the CDC.  Document Released: 02/17/2008 Document Revised: 11/23/2011 Document Reviewed: 02/17/2008 [facility] Patient Information 2014 [place], [place].  Strep Throat Strep throat is an infection of the throat caused by a bacteria named Streptococcus pyogenes. Your caregiver may call the infection streptococcal "tonsillitis" or "pharyngitis" depending on whether there are signs of inflammation in the tonsils or back of the throat. Strep throat is most common in children aged 5 15 years during the cold months of the year, but it can occur in people of any age during any season. This infection is spread from person to person (contagious) through coughing, sneezing, or other close contact. SYMPTOMS   Fever or chills.  Painful, swollen, red tonsils or throat.  Pain or difficulty when swallowing.  White or yellow spots on the tonsils or throat.  Swollen, tender lymph nodes or "glands" of the neck or under the jaw.  Red rash all over the body (rare). DIAGNOSIS  Many different infections can cause the same symptoms. A  test must be done to confirm the diagnosis so the right treatment can be given. A "rapid strep test" can help your caregiver make the diagnosis in a few minutes. If this test is not available, a light swab of the infected area can be used for a throat culture test. If a throat culture test is done, results are usually available in a day or two. TREATMENT  Strep throat is treated with antibiotic medicine. HOME CARE INSTRUCTIONS   Gargle with 1 tsp of salt in 1 cup of warm water, 3 4 times per day or as needed for comfort.  Family members who also have a sore throat or fever should be tested for strep throat and treated with antibiotics if they have the strep infection.  Make sure everyone in your household washes their hands well.  Do  not share food, drinking cups, or personal items that could cause the infection to spread to others.  You may need to eat a soft food diet until your sore throat gets better.  Drink enough water and fluids to keep your urine clear or pale yellow. This will help prevent dehydration.  Get plenty of rest.  Stay home from school, daycare, or work until you have been on antibiotics for 24 hours.  Only take over-the-counter or prescription medicines for pain, discomfort, or fever as directed by your caregiver.  If antibiotics are prescribed, take them as directed. Finish them even if you start to feel better. SEEK MEDICAL CARE IF:   The glands in your neck continue to enlarge.  You develop a rash, cough, or earache.  You cough up green, yellow-brown, or bloody sputum.  You have pain or discomfort not controlled by medicines.  Your problems seem to be getting worse rather than better. SEEK IMMEDIATE MEDICAL CARE IF:   You develop any new symptoms such as vomiting, severe headache, stiff or painful neck, chest pain, shortness of breath, or trouble swallowing.  You develop severe throat pain, drooling, or changes in your voice.  You develop swelling of the neck, or the skin on the neck becomes red and tender.  You have a fever.  You develop signs of dehydration, such as fatigue, dry mouth, and decreased urination.  You become increasingly sleepy, or you cannot wake up completely. Document Released: 08/28/2000 Document Revised: 08/17/2012 Document Reviewed: 10/30/2010 [facility] Patient Information 2014 [place], [place].

## 2014-02-01 DIAGNOSIS — J0430 Supraglottitis, unspecified, without obstruction: Secondary | ICD-10-CM | POA: Insufficient documentation

## 2014-02-01 DIAGNOSIS — N179 Acute kidney failure, unspecified: Secondary | ICD-10-CM | POA: Insufficient documentation

## 2014-02-01 LAB — CBC WITH DIFFERENTIAL/PLATELET
BASOS PCT: 0.2 % (ref 0.0–3.0)
Basophils Absolute: 0 10*3/uL (ref 0.0–0.1)
EOS PCT: 0.2 % (ref 0.0–5.0)
Eosinophils Absolute: 0 10*3/uL (ref 0.0–0.7)
HCT: 48 % (ref 39.0–52.0)
Hemoglobin: 16.6 g/dL (ref 13.0–17.0)
Lymphocytes Relative: 10.4 % — ABNORMAL LOW (ref 12.0–46.0)
Lymphs Abs: 1.1 10*3/uL (ref 0.7–4.0)
MCHC: 34.6 g/dL (ref 30.0–36.0)
MCV: 92.9 fl (ref 78.0–100.0)
MONO ABS: 1 10*3/uL (ref 0.1–1.0)
MONOS PCT: 9.7 % (ref 3.0–12.0)
NEUTROS PCT: 79.5 % — AB (ref 43.0–77.0)
Neutro Abs: 8.2 10*3/uL — ABNORMAL HIGH (ref 1.4–7.7)
PLATELETS: 165 10*3/uL (ref 150.0–400.0)
RBC: 5.17 Mil/uL (ref 4.22–5.81)
RDW: 13.1 % (ref 11.5–15.5)
WBC: 10.3 10*3/uL (ref 4.0–10.5)

## 2014-02-01 LAB — INFLUENZA A AND B
INFLUENZA A AG: NEGATIVE
INFLUENZA B AG: NEGATIVE

## 2014-02-02 ENCOUNTER — Telehealth: Payer: Self-pay | Admitting: Nurse Practitioner

## 2014-02-02 LAB — POCT RAPID STREP A (OFFICE): RAPID STREP A SCREEN: NEGATIVE

## 2014-02-02 LAB — CULTURE, UPPER RESPIRATORY: Organism ID, Bacteria: NORMAL

## 2014-02-02 NOTE — Telephone Encounter (Signed)
FYI: Admitted to ICU at [facility]

## 2014-02-02 NOTE — Telephone Encounter (Signed)
pls call pt: Advise Flu & strep culture neg. I think this is a viral pharyngitis. He may continue tamiflu and symptomatic treatment. Let us know if not better by end of next week, or sooner if worse.

## 2014-02-02 NOTE — Telephone Encounter (Signed)
Patient was admitted to the ICU dept at [facility]). He was unable to breathe.  Patient did not have the flu he had a bacterial infection in his throat.

## 2014-02-03 NOTE — Telephone Encounter (Signed)
Noted  

## 2014-02-27 ENCOUNTER — Encounter: Payer: Self-pay | Admitting: Podiatry

## 2014-02-27 ENCOUNTER — Ambulatory Visit (INDEPENDENT_AMBULATORY_CARE_PROVIDER_SITE_OTHER): Payer: BC Managed Care – PPO | Admitting: Podiatry

## 2014-02-27 VITALS — BP 136/70 | HR 79 | Resp 12

## 2014-02-27 DIAGNOSIS — M722 Plantar fascial fibromatosis: Secondary | ICD-10-CM

## 2014-02-27 NOTE — Progress Notes (Signed)
Thomas Spencer presents today complaining of painful orthotics he discussed these with an assistant today view of systems will return form the orthotics and we will notify her once those come in.

## 2014-04-17 ENCOUNTER — Telehealth: Payer: Self-pay | Admitting: *Deleted

## 2014-04-17 NOTE — Telephone Encounter (Signed)
Calling to speak with Thomas Spencer.  I'm waiting on inserts to be made.  It's going on 2-3 months.  Give me an update where things are.

## 2014-04-17 NOTE — Telephone Encounter (Signed)
I returned his call.  I asked him if it was for one insert because that's what I see here.  He stated no, let me give you some history, I had inserts already.  I wanted another pair.  I was told I needed to see the doctor.  I had my feet examined.  It was recommended that new inserts be made.  They were made and they were horrible.  We decided we were going to have some done like the originals that I had before.  So, I'm calling to check on the status of that.  What could the problem be?  I told him I am not sure.  I told him we had stopped using [employer] and started using [facility].  I don't know if [employer] still has your molds or scans.  I told him I would ask Vicente Males on tomorrow to see if she knows anything.

## 2014-04-20 ENCOUNTER — Other Ambulatory Visit: Payer: Self-pay | Admitting: Family Medicine

## 2014-05-24 ENCOUNTER — Encounter: Payer: Self-pay | Admitting: Podiatry

## 2014-05-25 ENCOUNTER — Encounter: Payer: BC Managed Care – PPO | Admitting: Family Medicine

## 2014-05-29 ENCOUNTER — Other Ambulatory Visit: Payer: Self-pay | Admitting: Family Medicine

## 2014-06-26 ENCOUNTER — Encounter: Payer: BC Managed Care – PPO | Admitting: Family Medicine

## 2014-07-03 ENCOUNTER — Ambulatory Visit (INDEPENDENT_AMBULATORY_CARE_PROVIDER_SITE_OTHER): Payer: BC Managed Care – PPO | Admitting: Family Medicine

## 2014-07-03 ENCOUNTER — Encounter: Payer: Self-pay | Admitting: Family Medicine

## 2014-07-03 VITALS — BP 137/86 | HR 74 | Temp 97.5°F | Resp 18 | Ht 65.5 in | Wt 163.0 lb

## 2014-07-03 DIAGNOSIS — Z23 Encounter for immunization: Secondary | ICD-10-CM

## 2014-07-03 DIAGNOSIS — R358 Other polyuria: Secondary | ICD-10-CM

## 2014-07-03 DIAGNOSIS — R3589 Other polyuria: Secondary | ICD-10-CM

## 2014-07-03 DIAGNOSIS — E785 Hyperlipidemia, unspecified: Secondary | ICD-10-CM

## 2014-07-03 DIAGNOSIS — Z0000 Encounter for general adult medical examination without abnormal findings: Secondary | ICD-10-CM

## 2014-07-03 DIAGNOSIS — Z125 Encounter for screening for malignant neoplasm of prostate: Secondary | ICD-10-CM

## 2014-07-03 DIAGNOSIS — R631 Polydipsia: Secondary | ICD-10-CM

## 2014-07-03 LAB — COMPREHENSIVE METABOLIC PANEL
ALT: 46 U/L (ref 0–53)
AST: 32 U/L (ref 0–37)
Albumin: 3.8 g/dL (ref 3.5–5.2)
Alkaline Phosphatase: 53 U/L (ref 39–117)
BILIRUBIN TOTAL: 0.9 mg/dL (ref 0.2–1.2)
BUN: 19 mg/dL (ref 6–23)
CHLORIDE: 100 meq/L (ref 96–112)
CO2: 25 mEq/L (ref 19–32)
CREATININE: 1.4 mg/dL (ref 0.4–1.5)
Calcium: 9.3 mg/dL (ref 8.4–10.5)
GFR: 70.59 mL/min (ref >60.00)
GLUCOSE: 88 mg/dL (ref 70–99)
Potassium: 4.1 mEq/L (ref 3.5–5.1)
Sodium: 135 mEq/L (ref 135–145)
Total Protein: 7 g/dL (ref 6.0–8.3)

## 2014-07-03 LAB — LIPID PANEL
CHOLESTEROL: 148 mg/dL (ref 0–200)
HDL: 28.1 mg/dL — ABNORMAL LOW (ref >39.00)
LDL CALC: 85 mg/dL (ref 0–99)
NonHDL: 119.9
Total CHOL/HDL Ratio: 5
Triglycerides: 174 mg/dL — ABNORMAL HIGH (ref 0.0–149.0)
VLDL: 34.8 mg/dL (ref 0.0–40.0)

## 2014-07-03 LAB — PSA: PSA: 0.59 ng/mL (ref 0.10–4.00)

## 2014-07-03 LAB — TSH: TSH: 0.95 u[IU]/mL (ref 0.35–4.50)

## 2014-07-03 MED ORDER — SILDENAFIL CITRATE 100 MG PO TABS: 100.0000 mg | ORAL_TABLET | Freq: Every day | ORAL | Status: DC | PRN

## 2014-07-03 NOTE — Assessment & Plan Note (Addendum)
Reviewed age and gender appropriate health maintenance issues (prudent diet, regular exercise, health risks of tobacco and excessive alcohol, use of seatbelts, fire alarms in home, use of sunscreen).  Also reviewed age and gender appropriate health screening as well as vaccine recommendations. Flu vaccine IM today. Colon cancer screening UTD. DRE today normal, PSA drawn today.  Due to his c/o polyuria and polydipsia and mild fatigue x 78mo, will check fasting glucose. (CBC, CMET, TSH today). He also asked for trial of viagra for mild ED: he said 1/2 of 100 mg tab helped in the past. Rx given today.

## 2014-07-03 NOTE — Progress Notes (Signed)
Office Note 07/03/2014  CC:  Chief Complaint  Patient presents with  . Annual Exam    fasting    HPI:  Thomas Spencer is a 48 y.o. Black male who is here for CPE, fasting. About 3 mo of excessive thirst, slightly low energy.  No signif vision changes. +Polyuria.  No aches or pains on Crestor at the 1/2 of 20mg  tab dosing. He works out 45 min 3 days a week: "crossfit type exercise". Trying to avoid white foods and some concentrated sweets.  Drinking less caloric drinks, more water. More fruits/veggies.  Compliant with all chronic meds.  No home bp checks to report.   Past Medical History  Diagnosis Date  . Hypertension   . BPH with obstruction/lower urinary tract symptoms     [facility] referral done 01/[facility] (Dr. Risa Grill); ultimately a prostate biopsy was done for increased PSA velocity--April 2013--and it  was completely normal.  . Family history of colon cancer     father  . Hyperlipidemia   . Elevated liver function tests     mild  . Family history of premature coronary heart disease     strong family history  . Family history of hypertension     strong family history  . Hx of colonic polyp 2007; 2014    Recall 07/2018 (Dr. Wynetta Emery at [place])  . Acute epiglottitis 01/2014    Past Surgical History  Procedure Laterality Date  . Dental implants    . Colonoscopy w/ polypectomy  09/02/06; 07/2013    [facility]: adenomatous polyp w/out high grade dysplasia or malignancy.  Recall 07/2018  . Prostate biopsy  01/2012    Normal    Family History  Problem Relation Age of Onset  . Heart attack Mother   . Heart disease Mother   . Hyperlipidemia Mother   . Hypertension Mother   . Cancer Father     colon  . Cancer Sister     cervical/ remission  . Heart disease Brother   . Hyperlipidemia Brother   . Hypertension Brother   . Diabetes Sister     type 2  . Hypertension Sister   . Hypertension Sister   . Diabetes Sister     type 2    History   Social  History  . Marital Status: Married    Spouse Name: N/A    Number of Children: N/A  . Years of Education: N/A   Occupational History  . Not on file.   Social History Main Topics  . Smoking status: Never Smoker   . Smokeless tobacco: Never Used  . Alcohol Use: Yes     Comment: wine- socially  . Drug Use: No  . Sexual Activity: Yes    Partners: Female   Other Topics Concern  . Not on file   Social History Narrative   SH: Married, one child.     He is a Personal assistant, lives in [place], [place].   No Tobacco, rare ETOH, no drug use.   Exercise occasionally.            MEDS: not taking tamiflu listed below Outpatient Prescriptions Prior to Visit  Medication Sig Dispense Refill  . aspirin 81 MG tablet Take 81 mg by mouth daily.        . CRESTOR 10 MG tablet TAKE 1 BY MOUTH AT BEDTIME  90 tablet  0  . Multiple Vitamin (MULTIVITAMIN) tablet Take 1 tablet by mouth daily.        Marland Kitchen  telmisartan (MICARDIS) 40 MG tablet TAKE 1 BY MOUTH DAILY  90 tablet  1  . aspirin 81 MG EC tablet Take 81 mg by mouth.      . fexofenadine (ALLEGRA) 180 MG tablet Take 1 tablet (180 mg total) by mouth daily.  30 tablet  1  . Multiple Vitamin (THERA) TABS Take 1 tablet by mouth.      . oseltamivir (TAMIFLU) 75 MG capsule Take 1 capsule (75 mg total) by mouth 2 (two) times daily.  10 capsule  0  . rosuvastatin (CRESTOR) 10 MG tablet Take 10 mg by mouth.      . telmisartan (MICARDIS) 40 MG tablet Take 40 mg by mouth.       No facility-administered medications prior to visit.    Allergies  Allergen Reactions  . Ace Inhibitors Cough    ROS Review of Systems  Constitutional: Positive for fatigue (mild). Negative for fever, chills and appetite change.  HENT: Negative for congestion, dental problem, ear pain and sore throat.   Eyes: Negative for discharge, redness and visual disturbance.  Respiratory: Negative for cough, chest tightness, shortness of breath and wheezing.   Cardiovascular: Negative for  chest pain, palpitations and leg swelling.  Gastrointestinal: Negative for nausea, vomiting, abdominal pain, diarrhea and blood in stool.  Endocrine: Positive for polydipsia and polyuria.  Genitourinary: Negative for dysuria, urgency, frequency, hematuria, flank pain and difficulty urinating.  Musculoskeletal: Negative for arthralgias, back pain, joint swelling, myalgias and neck stiffness.  Skin: Negative for pallor and rash.  Neurological: Negative for dizziness, speech difficulty, weakness and headaches.  Hematological: Negative for adenopathy. Does not bruise/bleed easily.  Psychiatric/Behavioral: Negative for confusion and sleep disturbance. The patient is not nervous/anxious.     PE; Blood pressure 137/86, pulse 74, temperature 97.5 F (36.4 C), temperature source Temporal, resp. rate 18, height 5' 5.5" (1.664 m), weight 163 lb (73.936 kg), SpO2 98.00%. Gen: Alert, well appearing.  Patient is oriented to person, place, time, and situation. AFFECT: pleasant, lucid thought and speech. ENT: Ears: EACs clear, normal epithelium.  TMs with good light reflex and landmarks bilaterally.  Eyes: no injection, icteris, swelling, or exudate.  EOMI, PERRLA. Nose: no drainage or turbinate edema/swelling.  No injection or focal lesion.  Mouth: lips without lesion/swelling.  Oral mucosa pink and moist.  Dentition intact and without obvious caries or gingival swelling.  Oropharynx without erythema, exudate, or swelling.  Neck: supple/nontender.  No LAD, mass, or TM.  Carotid pulses 2+ bilaterally, without bruits. CV: RRR, no m/r/g.   LUNGS: CTA bilat, nonlabored resps, good aeration in all lung fields. ABD: soft, NT, ND, BS normal.  No hepatospenomegaly or mass.  No bruits. EXT: no clubbing, cyanosis, or edema.  Musculoskeletal: no joint swelling, erythema, warmth, or tenderness.  ROM of all joints intact. Skin - no sores or suspicious lesions or rashes or color changes Rectal exam: negative without  mass, lesions or tenderness,  PROSTATE EXAM: smooth and symmetric without nodules or tenderness,   Pertinent labs:  Lab Results  Component Value Date   WBC 10.3 01/31/2014   HGB 16.6 01/31/2014   HCT 48.0 01/31/2014   MCV 92.9 01/31/2014   PLT 165.0 01/31/2014   Lab Results  Component Value Date   CHOL 129 08/15/2013   HDL 29* 08/15/2013   LDLCALC 61 08/15/2013   LDLDIRECT 157.0 12/09/2012   TRIG 193* 08/15/2013   CHOLHDL 4.4 08/15/2013     ASSESSMENT AND PLAN:   Health maintenance examination Reviewed age  and gender appropriate health maintenance issues (prudent diet, regular exercise, health risks of tobacco and excessive alcohol, use of seatbelts, fire alarms in home, use of sunscreen).  Also reviewed age and gender appropriate health screening as well as vaccine recommendations. Flu vaccine IM today. Colon cancer screening UTD. DRE today normal, PSA drawn today.  Due to his c/o polyuria and polydipsia and mild fatigue x 1mo, will check fasting glucose. (CBC, CMET, TSH today). He also asked for trial of viagra for mild ED: he said 1/2 of 100 mg tab helped in the past. Rx given today.  An After Visit Summary was printed and given to the patient.  FOLLOW UP:  Return in about 6 months (around 01/02/2015) for routine chronic illness f/u.

## 2014-07-03 NOTE — Progress Notes (Signed)
Pre visit review using our clinic review tool, if applicable. No additional management support is needed unless otherwise documented below in the visit note. 

## 2014-07-24 ENCOUNTER — Other Ambulatory Visit: Payer: Self-pay | Admitting: *Deleted

## 2014-07-24 MED ORDER — ROSUVASTATIN CALCIUM 10 MG PO TABS: ORAL_TABLET | ORAL | Status: DC

## 2014-07-27 ENCOUNTER — Other Ambulatory Visit: Payer: Self-pay | Admitting: Family Medicine

## 2014-07-27 ENCOUNTER — Other Ambulatory Visit: Payer: Self-pay

## 2014-07-27 MED ORDER — ROSUVASTATIN CALCIUM 10 MG PO TABS: ORAL_TABLET | ORAL | Status: DC

## 2014-10-16 ENCOUNTER — Other Ambulatory Visit: Payer: Self-pay | Admitting: Family Medicine

## 2014-10-16 MED ORDER — TELMISARTAN 40 MG PO TABS: ORAL_TABLET | ORAL | Status: DC

## 2015-01-30 ENCOUNTER — Telehealth: Payer: Self-pay | Admitting: Family Medicine

## 2015-01-30 NOTE — Telephone Encounter (Signed)
Pt called requesting referral for chiropractor but no referral was needed.  Disregard.

## 2015-03-08 ENCOUNTER — Telehealth: Payer: Self-pay | Admitting: Family Medicine

## 2015-03-08 ENCOUNTER — Other Ambulatory Visit: Payer: Self-pay | Admitting: Nurse Practitioner

## 2015-03-08 DIAGNOSIS — M5416 Radiculopathy, lumbar region: Secondary | ICD-10-CM

## 2015-03-08 MED ORDER — PREDNISONE 10 MG PO TABS: ORAL_TABLET | ORAL | Status: DC

## 2015-03-08 NOTE — Progress Notes (Signed)
Pt advised and voiced understanding. Rx sent to [facility] per pt request.

## 2015-03-08 NOTE — Progress Notes (Signed)
Pt c/o recurrent back pain. Prednisone helped w/last episode. Will f/u in ofc in 2 weeks

## 2015-03-08 NOTE — Telephone Encounter (Signed)
Pt called and is having a flare of his sciatica and would like a RX for prednisone that was prescribed last time he had a flare by Dr. Anitra Lauth. Please all him at [phone]. If rx can not be called please let him know if there is something else he can do.

## 2015-03-25 ENCOUNTER — Encounter: Payer: Self-pay | Admitting: Family Medicine

## 2015-03-25 ENCOUNTER — Ambulatory Visit (INDEPENDENT_AMBULATORY_CARE_PROVIDER_SITE_OTHER): Payer: 59 | Admitting: Family Medicine

## 2015-03-25 VITALS — BP 127/85 | HR 88 | Temp 97.9°F | Resp 16 | Ht 65.5 in | Wt 164.0 lb

## 2015-03-25 DIAGNOSIS — M5441 Lumbago with sciatica, right side: Secondary | ICD-10-CM

## 2015-03-25 MED ORDER — PREDNISONE 20 MG PO TABS: ORAL_TABLET | ORAL | Status: DC

## 2015-03-25 MED ORDER — HYDROCODONE-ACETAMINOPHEN 10-325 MG PO TABS: ORAL_TABLET | ORAL | Status: DC

## 2015-03-25 NOTE — Progress Notes (Signed)
OFFICE VISIT  03/30/2015   CC:  Chief Complaint  Patient presents with  . Back Pain    radiates down back on right leg x 1 month   HPI:    Patient is a 49 y.o. African-American male who presents for right gluteal/buttocks pain radiating down right leg. Felt this start after stepping in a small hole in flower bed about a month ago.   R foot has felt numb.  Went to [employer] and this has been helping some. R leg feels weak.  No prob w/ control of bowel/bladder.  No saddle anesthesia. I gave him a 5d course of pred about 2 wks ago and this helped but it did not completely go away.   Pain returned same after finished, esp after "tweaking it" again when trying to start a leafblower.   Advil 600 mg helps a bit, uses it prn.  Keeps ice on it.  Pain is getting more severe and constant.  Pain a little relieved with lying supine. Driving hurts a lot.  This pain is significantly impairing his ability to function normal at work and home.  Past Medical History  Diagnosis Date  . Hypertension   . BPH with obstruction/lower urinary tract symptoms     [facility] referral done 01/2011 (Dr. Risa Grill); ultimately a prostate biopsy was done for increased PSA velocity--April 2013--and it  was completely normal.  . Family history of colon cancer     father  . Hyperlipidemia   . Elevated liver function tests     mild  . Family history of premature coronary heart disease     strong family history  . Family history of hypertension     strong family history  . Hx of colonic polyp 2007; 2014    Recall 07/2018 (Dr. Wynetta Emery at [place])  . Acute epiglottitis 01/2014  . History of sciatica 11/2013    responded well to prednisone burst 11/2013    Past Surgical History  Procedure Laterality Date  . Dental implants    . Colonoscopy w/ polypectomy  09/02/06; 07/2013    [facility]: adenomatous polyp w/out high grade dysplasia or malignancy.  Recall 07/2018  . Prostate biopsy  01/2012    Normal    Outpatient  Prescriptions Prior to Visit  Medication Sig Dispense Refill  . aspirin 81 MG tablet Take 81 mg by mouth daily.      . Multiple Vitamin (MULTIVITAMIN) tablet Take 1 tablet by mouth daily.      . rosuvastatin (CRESTOR) 10 MG tablet TAKE 1 BY MOUTH AT BEDTIME 90 tablet 1  . telmisartan (MICARDIS) 40 MG tablet TAKE 1 BY MOUTH DAILY 90 tablet 1  . sildenafil (VIAGRA) 100 MG tablet Take 1 tablet (100 mg total) by mouth daily as needed for erectile dysfunction. 6 tablet 5  . CRESTOR 10 MG tablet TAKE 1 BY MOUTH AT BEDTIME (Patient not taking: Reported on 03/25/2015) 90 tablet 0  . predniSONE (DELTASONE) 10 MG tablet Take 4Tpo qam X 3d, then 3T po qam X 3d, then 2T po qd X 3d, then 1T po qam X 3d. (Patient not taking: Reported on 03/25/2015) 30 tablet 0   No facility-administered medications prior to visit.    Allergies  Allergen Reactions  . Ace Inhibitors Cough    ROS As per HPI  PE: Blood pressure 127/85, pulse 88, temperature 97.9 F (36.6 C), temperature source Oral, resp. rate 16, height 5' 5.5" (1.664 m), weight 164 lb (74.39 kg), SpO2 97 %.  Gen: Alert, well appearing.  Patient is oriented to person, place, time, and situation. Back: flexion elicits R LB, R glut pain that radiates down back of R leg.  Other back ROM intact w/out pain. R Sacral/Lumbar intersection tenderness to palpation. Sitting SLR + on right at 25 deg--elicits radiculopathy sx's. Proximally he has no weakness. Distally he has some mild ankle weakness, extension weaker than flexion.  LABS:  none  IMPRESSION AND PLAN:  R LB pain with right sided radiculopathy: spinal nerve impingement vs sciatica. Discussed dx with pt, reassured him that no imaging was indicated at this time, and emphasized that PT is the treatment of choice at this time.  He was agreeable to this, with addition of trial of prednisone 40mg  qd x 5d, then 20mg  qd x 5d. Also, for symptomatic care in acute phase of treatment, vicodin 10/325 1-2 q6h  prn, #30.  Therapeutic expectations and side effect profile of medication discussed today.  Patient's questions answered. [facility] PT referral ordered.  An After Visit Summary was printed and given to the patient.  FOLLOW UP: Return in about 8 weeks (around 05/20/2015) for routine chronic illness f/u.

## 2015-03-25 NOTE — Progress Notes (Signed)
Pre visit review using our clinic review tool, if applicable. No additional management support is needed unless otherwise documented below in the visit note. 

## 2015-03-27 ENCOUNTER — Ambulatory Visit: Payer: Self-pay | Admitting: Family Medicine

## 2015-03-30 DIAGNOSIS — M5441 Lumbago with sciatica, right side: Secondary | ICD-10-CM | POA: Insufficient documentation

## 2015-04-24 ENCOUNTER — Other Ambulatory Visit: Payer: Self-pay | Admitting: Family Medicine

## 2015-04-24 ENCOUNTER — Other Ambulatory Visit: Payer: Self-pay | Admitting: *Deleted

## 2015-04-24 MED ORDER — ROSUVASTATIN CALCIUM 10 MG PO TABS: ORAL_TABLET | ORAL | Status: DC

## 2015-04-24 NOTE — Telephone Encounter (Signed)
RF request for crestor LOV: 03/25/15  Next ov: None Last written: 07/27/14 #90 w/ 1RF

## 2015-04-24 NOTE — Telephone Encounter (Signed)
RF request for telmisartan LOV: 03/25/15 Next ov: Nove Last written: 10/16/14 #90 w/ 1RF

## 2015-05-13 ENCOUNTER — Ambulatory Visit (INDEPENDENT_AMBULATORY_CARE_PROVIDER_SITE_OTHER): Payer: 59 | Admitting: Family Medicine

## 2015-05-13 ENCOUNTER — Encounter: Payer: Self-pay | Admitting: Family Medicine

## 2015-05-13 VITALS — BP 122/86 | HR 64 | Temp 97.9°F | Resp 16 | Ht 65.5 in | Wt 163.0 lb

## 2015-05-13 DIAGNOSIS — M5431 Sciatica, right side: Secondary | ICD-10-CM

## 2015-05-13 NOTE — Progress Notes (Signed)
Pre visit review using our clinic review tool, if applicable. No additional management support is needed unless otherwise documented below in the visit note. 

## 2015-05-13 NOTE — Progress Notes (Signed)
OFFICE VISIT  05/13/2015   CC:  Chief Complaint  Patient presents with  . Back Pain    right side and radiates down right leg   HPI:    Patient is a 49 y.o. African-American male who presents for 6 week f/u R gluteal pain that radiates down R leg. Prednisone taper and PT arranged at last visit.  Also vicodin short term for acute pain tx.  He is feeling much improved.  Almost no more pain, has much more mobility. He is concerned about ongoing tingling/numbness feeling on bottom of R foot.  No R leg weakness. Bowel and bladder control intact.  No saddle anesthesia.     Past Medical History  Diagnosis Date  . Hypertension   . BPH with obstruction/lower urinary tract symptoms     [facility] referral done 01/[facility] (Dr. Risa Grill); ultimately a prostate biopsy was done for increased PSA velocity--April 2013--and it  was completely normal.  . Family history of colon cancer     father  . Hyperlipidemia   . Elevated liver function tests     mild  . Family history of premature coronary heart disease     strong family history  . Family history of hypertension     strong family history  . Hx of colonic polyp 2007; 2014    Recall 07/2018 (Dr. Wynetta Emery at [facility])  . Acute epiglottitis 01/2014  . History of sciatica 11/2013    responded well to prednisone burst 11/2013    Past Surgical History  Procedure Laterality Date  . Dental implants    . Colonoscopy w/ polypectomy  09/02/06; 07/2013    [facility]: adenomatous polyp w/out high grade dysplasia or malignancy.  Recall 07/2018  . Prostate biopsy  01/2012    Normal    Outpatient Prescriptions Prior to Visit  Medication Sig Dispense Refill  . aspirin 81 MG tablet Take 81 mg by mouth daily.      . Multiple Vitamin (MULTIVITAMIN) tablet Take 1 tablet by mouth daily.      . rosuvastatin (CRESTOR) 10 MG tablet TAKE 1 BY MOUTH AT BEDTIME 90 tablet 1  . sildenafil (VIAGRA) 100 MG tablet Take 1 tablet (100 mg total) by mouth daily as  needed for erectile dysfunction. 6 tablet 5  . telmisartan (MICARDIS) 40 MG tablet Take 1 tablet by mouth  daily 90 tablet 1  . HYDROcodone-acetaminophen (NORCO) 10-325 MG per tablet 1-2 tabs po q6h prn pain (Patient not taking: Reported on 05/13/2015) 30 tablet 0  . predniSONE (DELTASONE) 20 MG tablet 2 tabs po qd x 5d, then 1 tab po qd x 5d (Patient not taking: Reported on 05/13/2015) 15 tablet 0   No facility-administered medications prior to visit.    Allergies  Allergen Reactions  . Ace Inhibitors Cough    ROS As per HPI  PE: Blood pressure 122/86, pulse 64, temperature 97.9 F (36.6 C), temperature source Oral, resp. rate 16, height 5' 5.5" (1.664 m), weight 163 lb (73.936 kg), SpO2 95 %. Gen: Alert, well appearing.  Patient is oriented to person, place, time, and situation. AFFECT: pleasant, lucid thought and speech. No further exam today  LABS:  none  IMPRESSION AND PLAN:  R sided sciatica. Improved appropriately s/p 10d prednisone taper and 6 wks of PT (2 x/week). Reassured pt regarding ongoing paresthesia in R plantar area---should resolve over time. He'll call or return if it does not or if he gets recurrence of old sx's or any NEW sx's.  An After Visit Summary was printed and given to the patient.  FOLLOW UP: Return if symptoms worsen or fail to improve.

## 2015-05-22 ENCOUNTER — Encounter: Payer: Self-pay | Admitting: Family Medicine

## 2015-05-22 ENCOUNTER — Ambulatory Visit (INDEPENDENT_AMBULATORY_CARE_PROVIDER_SITE_OTHER): Payer: 59 | Admitting: Family Medicine

## 2015-05-22 VITALS — BP 129/83 | HR 64 | Temp 98.1°F | Resp 16 | Ht 65.5 in | Wt 163.0 lb

## 2015-05-22 DIAGNOSIS — M5442 Lumbago with sciatica, left side: Secondary | ICD-10-CM | POA: Diagnosis not present

## 2015-05-22 DIAGNOSIS — M5431 Sciatica, right side: Secondary | ICD-10-CM | POA: Diagnosis not present

## 2015-05-22 DIAGNOSIS — M5416 Radiculopathy, lumbar region: Secondary | ICD-10-CM | POA: Diagnosis not present

## 2015-05-22 DIAGNOSIS — M5441 Lumbago with sciatica, right side: Secondary | ICD-10-CM

## 2015-05-22 MED ORDER — PREDNISONE 20 MG PO TABS: ORAL_TABLET | ORAL | Status: DC

## 2015-05-22 NOTE — Progress Notes (Signed)
Pre visit review using our clinic review tool, if applicable. No additional management support is needed unless otherwise documented below in the visit note. 

## 2015-05-22 NOTE — Progress Notes (Signed)
OFFICE VISIT  05/22/2015   CC:  Chief Complaint  Patient presents with  . Back Pain   HPI:    Patient is a 49 y.o. African-American male who presents for f/u "sciatica issue".  I just saw him about 10d ago for f/u of this and he had some residual R foot numbness but was otherwise significantly improved s/p PT. Then, about 5d/a he had resumption of pain radiating down R leg: starts at intersection of R LB/glut.  No preceding strain/trauma. R leg feels weak.  Left leg now having intermittent tingling and shooting pain down to foot--L leg sx's are new.  Again, starting in LB/glut intersection. Stretching had been helping in the past but no help now. Vicodin 5mg  x 2 tabs at a time minimally helps the pain--lingering drowsiness bothers him. Took 600 mg ibup last night.  No problems controlling bowel/bladder, no saddle anesthesia.    He is very bothered by his sx's, can't sleep or get into comfortable position.  Past Medical History  Diagnosis Date  . Hypertension   . BPH with obstruction/lower urinary tract symptoms     [facility] referral done 01/[facility] (Dr. Risa Grill); ultimately a prostate biopsy was done for increased PSA velocity--April 2013--and it  was completely normal.  . Family history of colon cancer     father  . Hyperlipidemia   . Elevated liver function tests     mild  . Family history of premature coronary heart disease     strong family history  . Family history of hypertension     strong family history  . Hx of colonic polyp 2007; 2014    Recall 07/2018 (Dr. Wynetta Emery at [facility])  . Acute epiglottitis 01/2014  . History of sciatica 11/2013    responded well to prednisone burst 11/2013    Past Surgical History  Procedure Laterality Date  . Dental implants    . Colonoscopy w/ polypectomy  09/02/06; 07/2013    [facility]: adenomatous polyp w/out high grade dysplasia or malignancy.  Recall 07/2018  . Prostate biopsy  01/2012    Normal    Outpatient Prescriptions  Prior to Visit  Medication Sig Dispense Refill  . aspirin 81 MG tablet Take 81 mg by mouth daily.      . Multiple Vitamin (MULTIVITAMIN) tablet Take 1 tablet by mouth daily.      . rosuvastatin (CRESTOR) 10 MG tablet TAKE 1 BY MOUTH AT BEDTIME 90 tablet 1  . sildenafil (VIAGRA) 100 MG tablet Take 1 tablet (100 mg total) by mouth daily as needed for erectile dysfunction. 6 tablet 5  . telmisartan (MICARDIS) 40 MG tablet Take 1 tablet by mouth  daily 90 tablet 1   No facility-administered medications prior to visit.    Allergies  Allergen Reactions  . Ace Inhibitors Cough    ROS As per HPI  PE: Blood pressure 129/83, pulse 64, temperature 98.1 F (36.7 C), temperature source Other (Comment), resp. rate 16, height 5' 5.5" (1.664 m), weight 163 lb (73.936 kg), SpO2 98 %. Gen: Alert, well appearing, moves slowly.  Patient is oriented to person, place, time, and situation. Back: ROM intact except forward flexion only to 90 deg due to pain down both legs and numbness feeling in R leg. Mild TTP in R Lumbar paraspinous region.  Also TTP in mid R glut region. SLR + at 30 deg bilat.  DTR in patellar region is 1+ bilat.  Did not check achilles reflexes today--pt wearing boots. Strength: testing  while pt sitting showed symmetric strength, 5-/5 prox and dist, with the exception of 4/5 strength with R ankle dorsiflexion.  He did have mild impairment in R foot heel raises while standing. Hard to tell how much of his weakness was secondary to his pain. No sensory testing done today.  LABS:  none  IMPRESSION AND PLAN:  1) R>L lumbosacral back pain with persistent R leg radiculopathy and intermittent L leg radiculopathy. 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  Given the fact that he just recently completed 6 weeks of PT plus his extreme discomfort and bilateral involvement, will pursue MRI (noncontrast) of  L/S spine to further evaluate.  Will refer to specialist after results of this MRI are reviewed. Will do prednisone taper again: 40mg  qd x 5d, then 20mg  qd x 5d.  He has vicodin 5/325 to use q6h prn--no new rx needed today.  An After Visit Summary was printed and given to the patient.  FOLLOW UP: Return for f/u to be determined based on results of w/u.

## 2015-05-31 ENCOUNTER — Ambulatory Visit
Admission: RE | Admit: 2015-05-31 | Discharge: 2015-05-31 | Disposition: A | Discharge status: Home / Self Care | Payer: 59 | Source: Ambulatory Visit | Attending: Family Medicine | Admitting: Family Medicine

## 2015-05-31 DIAGNOSIS — M5441 Lumbago with sciatica, right side: Secondary | ICD-10-CM

## 2015-05-31 DIAGNOSIS — M5442 Lumbago with sciatica, left side: Secondary | ICD-10-CM

## 2015-05-31 DIAGNOSIS — M5416 Radiculopathy, lumbar region: Secondary | ICD-10-CM

## 2015-06-03 ENCOUNTER — Encounter: Payer: Self-pay | Admitting: Family Medicine

## 2015-07-04 ENCOUNTER — Other Ambulatory Visit: Payer: Self-pay | Admitting: Family Medicine

## 2015-07-04 NOTE — Telephone Encounter (Signed)
RF request for telmisartan LOV: 05/22/15 Acute visit for back pain, pt was to f/u in 12/2014 for RCI no apt was made only been seen for back pain since CPE on 07/03/14. Next ov: None Last written: 04/24/15 #90 w/ 1RF  Spoke to Salomon Fick who advised pt that he will need to schedule ov with in the next 3 months to receive more refills. Pt stated to Salomon Fick that he will call back to schedule apt.

## 2015-08-05 ENCOUNTER — Encounter: Payer: 59 | Admitting: Family Medicine

## 2015-08-28 ENCOUNTER — Encounter: Payer: 59 | Admitting: Family Medicine

## 2015-10-23 ENCOUNTER — Encounter: Payer: Self-pay | Admitting: Family Medicine

## 2015-10-23 ENCOUNTER — Ambulatory Visit (INDEPENDENT_AMBULATORY_CARE_PROVIDER_SITE_OTHER): Payer: 59 | Admitting: Family Medicine

## 2015-10-23 VITALS — BP 130/85 | HR 60 | Temp 98.0°F | Resp 16 | Ht 65.0 in | Wt 162.5 lb

## 2015-10-23 DIAGNOSIS — Z0000 Encounter for general adult medical examination without abnormal findings: Secondary | ICD-10-CM

## 2015-10-23 DIAGNOSIS — Z125 Encounter for screening for malignant neoplasm of prostate: Secondary | ICD-10-CM

## 2015-10-23 DIAGNOSIS — Z131 Encounter for screening for diabetes mellitus: Secondary | ICD-10-CM | POA: Diagnosis not present

## 2015-10-23 LAB — TSH: TSH: 0.81 u[IU]/mL (ref 0.35–4.50)

## 2015-10-23 LAB — CBC WITH DIFFERENTIAL/PLATELET
Basophils Absolute: 0 10*3/uL (ref 0.0–0.1)
Basophils Relative: 0.7 % (ref 0.0–3.0)
EOS PCT: 3.9 % (ref 0.0–5.0)
Eosinophils Absolute: 0.2 10*3/uL (ref 0.0–0.7)
HCT: 48 % (ref 39.0–52.0)
Hemoglobin: 16.3 g/dL (ref 13.0–17.0)
LYMPHS ABS: 1.9 10*3/uL (ref 0.7–4.0)
Lymphocytes Relative: 46.3 % — ABNORMAL HIGH (ref 12.0–46.0)
MCHC: 33.8 g/dL (ref 30.0–36.0)
MCV: 92.2 fl (ref 78.0–100.0)
MONO ABS: 0.4 10*3/uL (ref 0.1–1.0)
Monocytes Relative: 9.5 % (ref 3.0–12.0)
NEUTROS ABS: 1.6 10*3/uL (ref 1.4–7.7)
NEUTROS PCT: 39.6 % — AB (ref 43.0–77.0)
PLATELETS: 164 10*3/uL (ref 150.0–400.0)
RBC: 5.21 Mil/uL (ref 4.22–5.81)
RDW: 13 % (ref 11.5–15.5)
WBC: 4.1 10*3/uL (ref 4.0–10.5)

## 2015-10-23 LAB — COMPREHENSIVE METABOLIC PANEL
ALT: 39 U/L (ref 0–53)
AST: 29 U/L (ref 0–37)
Albumin: 4.4 g/dL (ref 3.5–5.2)
Alkaline Phosphatase: 49 U/L (ref 39–117)
BUN: 21 mg/dL (ref 6–23)
CHLORIDE: 101 meq/L (ref 96–112)
CO2: 30 meq/L (ref 19–32)
Calcium: 9.6 mg/dL (ref 8.4–10.5)
Creatinine, Ser: 1.36 mg/dL (ref 0.40–1.50)
GFR: 70.82 mL/min (ref >60.00)
GLUCOSE: 92 mg/dL (ref 70–99)
POTASSIUM: 4.6 meq/L (ref 3.5–5.1)
SODIUM: 138 meq/L (ref 135–145)
TOTAL PROTEIN: 7 g/dL (ref 6.0–8.3)
Total Bilirubin: 0.7 mg/dL (ref 0.2–1.2)

## 2015-10-23 LAB — LIPID PANEL
CHOL/HDL RATIO: 5
Cholesterol: 158 mg/dL (ref 0–200)
HDL: 33.5 mg/dL — AB (ref >39.00)
LDL CALC: 97 mg/dL (ref 0–99)
NONHDL: 124.17
Triglycerides: 135 mg/dL (ref 0.0–149.0)
VLDL: 27 mg/dL (ref 0.0–40.0)

## 2015-10-23 LAB — HEMOGLOBIN A1C: Hgb A1c MFr Bld: 5.9 % (ref 4.6–6.5)

## 2015-10-23 LAB — PSA: PSA: 0.61 ng/mL (ref 0.10–4.00)

## 2015-10-23 MED ORDER — TELMISARTAN 40 MG PO TABS: ORAL_TABLET | ORAL | Status: DC

## 2015-10-23 NOTE — Progress Notes (Signed)
Office Note 10/23/2015  CC:  Chief Complaint  Patient presents with  . Annual Exam    Pt is fasting.     HPI:  Thomas Spencer is a 50 y.o. Black male who is here for annual health maintenance exam (fasting). Feeling well, no acute complaints. Remains active, healthy diet.   Past Medical History  Diagnosis Date  . Hypertension   . BPH with obstruction/lower urinary tract symptoms     [facility] done 01/[facility] (Dr. Risa Grill); ultimately a prostate biopsy was done for increased PSA velocity--April 2013--and it  was completely normal.  . Family history of colon cancer     father  . Hyperlipidemia   . Elevated liver function tests     mild  . Family history of premature coronary heart disease     strong family history  . Family history of hypertension     strong family history  . Hx of colonic polyp 2007; 2014    Recall 07/2018 (Dr. Wynetta Emery at [place])  . Acute epiglottitis 01/2014  . History of sciatica 11/2013    responded well to prednisone burst 11/2013    Past Surgical History  Procedure Laterality Date  . Dental implants    . Colonoscopy w/ polypectomy  09/02/06; 07/2013    [facility]: adenomatous polyp w/out high grade dysplasia or malignancy.  Recall 07/2018  . Prostate biopsy  01/2012    Normal    Family History  Problem Relation Age of Onset  . Heart attack Mother   . Heart disease Mother   . Hyperlipidemia Mother   . Hypertension Mother   . Cancer Father     colon  . Cancer Sister     cervical/ remission  . Heart disease Brother   . Hyperlipidemia Brother   . Hypertension Brother   . Diabetes Sister     type 2  . Hypertension Sister   . Hypertension Sister   . Diabetes Sister     type 2    Social History   Social History  . Marital Status: Married    Spouse Name: N/A  . Number of Children: N/A  . Years of Education: N/A   Occupational History  . Not on file.   Social History Main Topics  . Smoking status: Never Smoker   .  Smokeless tobacco: Never Used  . Alcohol Use: Yes     Comment: wine- socially  . Drug Use: No  . Sexual Activity:    Partners: Female   Other Topics Concern  . Not on file   Social History Narrative   SH: Married, two teenagers (1 boy and 1 girl) as of 10/2015.   He is a Personal assistant, lives in [place], [place].   No Tobacco, rare ETOH, no drug use.   Exercise occasionally.             Outpatient Prescriptions Prior to Visit  Medication Sig Dispense Refill  . aspirin 81 MG tablet Take 81 mg by mouth daily.      . Multiple Vitamin (MULTIVITAMIN) tablet Take 1 tablet by mouth daily.      . rosuvastatin (CRESTOR) 10 MG tablet TAKE 1 BY MOUTH AT BEDTIME 90 tablet 1  . sildenafil (VIAGRA) 100 MG tablet Take 1 tablet (100 mg total) by mouth daily as needed for erectile dysfunction. 6 tablet 5  . telmisartan (MICARDIS) 40 MG tablet Take 1 tablet by mouth  daily 90 tablet 0  . predniSONE (DELTASONE) 20  MG tablet 2 tabs po qd x 5d, then 1 tab po qd x 5d (Patient not taking: Reported on 10/23/2015) 15 tablet 0   No facility-administered medications prior to visit.    Allergies  Allergen Reactions  . Ace Inhibitors Cough    ROS Review of Systems  Constitutional: Negative for fever, chills, appetite change and fatigue.  HENT: Negative for congestion, dental problem, ear pain and sore throat.   Eyes: Negative for discharge, redness and visual disturbance.  Respiratory: Negative for cough, chest tightness, shortness of breath and wheezing.   Cardiovascular: Negative for chest pain, palpitations and leg swelling.  Gastrointestinal: Negative for nausea, vomiting, abdominal pain, diarrhea and blood in stool.  Genitourinary: Negative for dysuria, urgency, frequency, hematuria, flank pain and difficulty urinating.  Musculoskeletal: Negative for myalgias, back pain, joint swelling, arthralgias and neck stiffness.  Skin: Negative for pallor and rash.  Neurological: Negative for dizziness, speech  difficulty, weakness and headaches.  Hematological: Negative for adenopathy. Does not bruise/bleed easily.  Psychiatric/Behavioral: Negative for confusion and sleep disturbance. The patient is not nervous/anxious.     PE; Blood pressure 130/85, pulse 60, temperature 98 F (36.7 C), temperature source Oral, resp. rate 16, height 5\' 5"  (1.651 m), weight 162 lb 8 oz (73.71 kg), SpO2 95 %. Gen: Alert, well appearing.  Patient is oriented to person, place, time, and situation. AFFECT: pleasant, lucid thought and speech. ENT: Ears: EACs clear, normal epithelium.  TMs with good light reflex and landmarks bilaterally.  Eyes: no injection, icteris, swelling, or exudate.  EOMI, PERRLA. Nose: no drainage or turbinate edema/swelling.  No injection or focal lesion.  Mouth: lips without lesion/swelling.  Oral mucosa pink and moist.  Dentition intact and without obvious caries or gingival swelling.  Oropharynx without erythema, exudate, or swelling.  Neck: supple/nontender.  No LAD, mass, or TM.  Carotid pulses 2+ bilaterally, without bruits. CV: RRR, no m/r/g.   LUNGS: CTA bilat, nonlabored resps, good aeration in all lung fields. ABD: soft, NT, ND, BS normal.  No hepatospenomegaly or mass.  No bruits. EXT: no clubbing, cyanosis, or edema.  Musculoskeletal: no joint swelling, erythema, warmth, or tenderness.  ROM of all joints intact. Skin - no sores or suspicious lesions or rashes or color changes Rectal exam: negative without mass, lesions or tenderness, PROSTATE EXAM: smooth and symmetric without nodules or tenderness.   Pertinent labs:  Lab Results  Component Value Date   TSH 0.95 07/03/2014   Lab Results  Component Value Date   WBC 10.3 01/31/2014   HGB 16.6 01/31/2014   HCT 48.0 01/31/2014   MCV 92.9 01/31/2014   PLT 165.0 01/31/2014   Lab Results  Component Value Date   CREATININE 1.4 07/03/2014   BUN 19 07/03/2014   NA 135 07/03/2014   K 4.1 07/03/2014   CL 100 07/03/2014   CO2 25  07/03/2014   Lab Results  Component Value Date   ALT 46 07/03/2014   AST 32 07/03/2014   ALKPHOS 53 07/03/2014   BILITOT 0.9 07/03/2014   Lab Results  Component Value Date   CHOL 148 07/03/2014   Lab Results  Component Value Date   HDL 28.10* 07/03/2014   Lab Results  Component Value Date   LDLCALC 85 07/03/2014   Lab Results  Component Value Date   TRIG 174.0* 07/03/2014   Lab Results  Component Value Date   CHOLHDL 5 07/03/2014   Lab Results  Component Value Date   PSA 0.59 07/03/2014  PSA 0.53 12/09/2012   PSA 1.74 11/04/2011   ASSESSMENT AND PLAN:   Health maintenance exam:  Reviewed age and gender appropriate health maintenance issues (prudent diet, regular exercise, health risks of tobacco and excessive alcohol, use of seatbelts, fire alarms in home, use of sunscreen).  Also reviewed age and gender appropriate health screening as well as vaccine recommendations. Vaccines UTD. Fasting HP labs today.  Pt desired Hba1c so he said he would be willing to pay out of pocket for this if insurance does not pay for it. DRE normal, PSA drawn. Colon ca screening UTD: next colonoscopy due after 07/2018.  An After Visit Summary was printed and given to the patient.  FOLLOW UP:  Return in about 6 months (around 04/21/2016) for routine chronic illness f/u.

## 2015-10-23 NOTE — Progress Notes (Signed)
Pre visit review using our clinic review tool, if applicable. No additional management support is needed unless otherwise documented below in the visit note. 

## 2015-10-24 ENCOUNTER — Other Ambulatory Visit: Payer: Self-pay | Admitting: *Deleted

## 2015-10-24 MED ORDER — ROSUVASTATIN CALCIUM 10 MG PO TABS: ORAL_TABLET | ORAL | Status: DC

## 2015-10-24 NOTE — Telephone Encounter (Signed)
RF request for crestor LOV: 10/23/15 Next ov: None Last written: 04/24/15 #90 w/ 1RF

## 2015-10-31 ENCOUNTER — Other Ambulatory Visit: Payer: Self-pay | Admitting: *Deleted

## 2015-10-31 MED ORDER — SILDENAFIL CITRATE 100 MG PO TABS: 100.0000 mg | ORAL_TABLET | Freq: Every day | ORAL | Status: DC | PRN

## 2015-10-31 NOTE — Telephone Encounter (Signed)
RF request for Viagra LOV: 10/23/15 Next ov: None Last written: 07/03/14 #6 w/ 5RF

## 2016-09-11 ENCOUNTER — Other Ambulatory Visit: Payer: Self-pay | Admitting: Family Medicine

## 2016-09-11 NOTE — Telephone Encounter (Signed)
RF request for rosuvastatin LOV: 10/23/15 Next ov: None Last written: 10/24/15 #90 w/ 3RF  Rx sent for #90 w/ 0RF. Pt is over due for f/u RCI, needs office visit for more refills.

## 2016-11-04 ENCOUNTER — Other Ambulatory Visit: Payer: Self-pay | Admitting: Family Medicine

## 2016-11-04 NOTE — Telephone Encounter (Signed)
[facility].  RF request for telmisartan LOV: 10/23/15 Next ov: None Last written: 10/23/15 #90 w/ 3RF  Will send Rx for #90 w/ 0RF. Pt needs to schedule f/u for RCI or CPE for more refills.

## 2016-11-04 NOTE — Telephone Encounter (Signed)
Pt advised and voiced understanding.  Apt made for 12/08/16 at 9:00am.

## 2016-11-10 ENCOUNTER — Telehealth: Payer: Self-pay | Admitting: Family Medicine

## 2016-11-10 MED ORDER — SILDENAFIL CITRATE 100 MG PO TABS: 100.0000 mg | ORAL_TABLET | Freq: Every day | ORAL | 5 refills | Status: DC | PRN

## 2016-11-10 NOTE — Telephone Encounter (Signed)
Patient needs refill today on rx sildenafil (VIAGRA) 100 MG tablet. Patient is leaving this evening to go out of town for the remainder of the week and needs rx filled as soon as possible today. Please call patient at [phone] once rx if filled.   [id number] - [place], [place] (Phone) [phone] (Fax)

## 2016-11-10 NOTE — Telephone Encounter (Signed)
Rx sent.   Left message on phone stating that Rx requested was sent to pts pharmacy.

## 2016-11-10 NOTE — Telephone Encounter (Signed)
RF request for sildenafil LOV: 10/23/15 Next ov: 12/08/16 Last written:' 10/31/15 #6 w/ 5RF

## 2016-11-12 DIAGNOSIS — R7303 Prediabetes: Secondary | ICD-10-CM

## 2016-11-12 HISTORY — DX: Prediabetes: R73.03

## 2016-12-08 ENCOUNTER — Encounter: Payer: Self-pay | Admitting: Family Medicine

## 2016-12-08 ENCOUNTER — Ambulatory Visit (INDEPENDENT_AMBULATORY_CARE_PROVIDER_SITE_OTHER): Payer: 59 | Admitting: Family Medicine

## 2016-12-08 VITALS — BP 137/86 | HR 59 | Temp 97.7°F | Resp 16 | Ht 65.25 in | Wt 168.5 lb

## 2016-12-08 DIAGNOSIS — Z8249 Family history of ischemic heart disease and other diseases of the circulatory system: Secondary | ICD-10-CM | POA: Diagnosis not present

## 2016-12-08 DIAGNOSIS — Z0000 Encounter for general adult medical examination without abnormal findings: Secondary | ICD-10-CM | POA: Diagnosis not present

## 2016-12-08 DIAGNOSIS — Z125 Encounter for screening for malignant neoplasm of prostate: Secondary | ICD-10-CM | POA: Diagnosis not present

## 2016-12-08 DIAGNOSIS — E8881 Metabolic syndrome: Secondary | ICD-10-CM | POA: Diagnosis not present

## 2016-12-08 DIAGNOSIS — R0600 Dyspnea, unspecified: Secondary | ICD-10-CM | POA: Diagnosis not present

## 2016-12-08 DIAGNOSIS — R61 Generalized hyperhidrosis: Secondary | ICD-10-CM

## 2016-12-08 LAB — LIPID PANEL
Cholesterol: 147 mg/dL (ref 0–200)
HDL: 29.8 mg/dL — ABNORMAL LOW (ref >39.00)
NONHDL: 117.2
Total CHOL/HDL Ratio: 5
Triglycerides: 220 mg/dL — ABNORMAL HIGH (ref 0.0–149.0)
VLDL: 44 mg/dL — AB (ref 0.0–40.0)

## 2016-12-08 LAB — COMPREHENSIVE METABOLIC PANEL
ALT: 52 U/L (ref 0–53)
AST: 31 U/L (ref 0–37)
Albumin: 4.5 g/dL (ref 3.5–5.2)
Alkaline Phosphatase: 50 U/L (ref 39–117)
BUN: 17 mg/dL (ref 6–23)
CALCIUM: 9.4 mg/dL (ref 8.4–10.5)
CO2: 31 meq/L (ref 19–32)
CREATININE: 1.35 mg/dL (ref 0.40–1.50)
Chloride: 101 mEq/L (ref 96–112)
GFR: 71.12 mL/min (ref >60.00)
Glucose, Bld: 96 mg/dL (ref 70–99)
Potassium: 4.6 mEq/L (ref 3.5–5.1)
SODIUM: 136 meq/L (ref 135–145)
Total Bilirubin: 0.7 mg/dL (ref 0.2–1.2)
Total Protein: 7 g/dL (ref 6.0–8.3)

## 2016-12-08 LAB — CBC WITH DIFFERENTIAL/PLATELET
BASOS PCT: 1.4 % (ref 0.0–3.0)
Basophils Absolute: 0 10*3/uL (ref 0.0–0.1)
EOS ABS: 0.1 10*3/uL (ref 0.0–0.7)
EOS PCT: 3.8 % (ref 0.0–5.0)
HEMATOCRIT: 48.8 % (ref 39.0–52.0)
HEMOGLOBIN: 16.7 g/dL (ref 13.0–17.0)
Lymphocytes Relative: 42.5 % (ref 12.0–46.0)
Lymphs Abs: 1.5 10*3/uL (ref 0.7–4.0)
MCHC: 34.3 g/dL (ref 30.0–36.0)
MCV: 93.8 fl (ref 78.0–100.0)
MONOS PCT: 10.1 % (ref 3.0–12.0)
Monocytes Absolute: 0.4 10*3/uL (ref 0.1–1.0)
Neutro Abs: 1.5 10*3/uL (ref 1.4–7.7)
Neutrophils Relative %: 42.2 % — ABNORMAL LOW (ref 43.0–77.0)
Platelets: 166 10*3/uL (ref 150.0–400.0)
RBC: 5.2 Mil/uL (ref 4.22–5.81)
RDW: 12.7 % (ref 11.5–15.5)
WBC: 3.5 10*3/uL — AB (ref 4.0–10.5)

## 2016-12-08 LAB — HEMOGLOBIN A1C: Hgb A1c MFr Bld: 6.1 % (ref 4.6–6.5)

## 2016-12-08 LAB — PSA: PSA: 0.68 ng/mL (ref 0.10–4.00)

## 2016-12-08 LAB — TSH: TSH: 0.73 u[IU]/mL (ref 0.35–4.50)

## 2016-12-08 MED ORDER — ROSUVASTATIN CALCIUM 10 MG PO TABS: 10.0000 mg | ORAL_TABLET | Freq: Every day | ORAL | 3 refills | Status: DC

## 2016-12-08 NOTE — Progress Notes (Signed)
Office Note 12/08/2016  CC:  Chief Complaint  Patient presents with  . Annual Exam    Pt is fasting.   HPI:  Thomas Spencer is a 51 y.o. male who is here for annual health maintenance exam.  Two concerns: 1) In last couple months he has had two episodes of diaphoresis and some SOB after climbing a flight of stairs. However, runs on treadmill frequently and has no problems with these symptoms.  Has a little bit of sense of heart racing/palpitation with the episodes.  The symptoms usually last about 2-3 minutes and he then feels back to normal.  No CP with these episodes.    2) Wife says he doesn't sleep well at night.  He wakes up around 4 AM most nights for no reason and can't get back to sleep until around 7AM.  Initiates sleep fine.  No ruminating or mind racing during these times. He denies restless legs sensation.  No daytime naps.  He has no night time routine b/c of lifestyle. Has used melatonin otc, he doesn't recall what dose or how long he used but says it was ineffective.  Past Medical History:  Diagnosis Date  . Acute epiglottitis 01/2014  . BPH with obstruction/lower urinary tract symptoms    [facility] referral done 01/[facility] (Dr. Risa Grill); ultimately a prostate biopsy was done for increased PSA velocity--April 2013--and it  was completely normal.  . Elevated liver function tests    mild  . Erectile dysfunction   . Family history of colon cancer    father  . Family history of hypertension    strong family history  . Family history of premature coronary heart disease    strong family history  . History of sciatica 11/2013   responded well to prednisone burst 11/2013  . Hx of colonic polyp 2007; 2014   Recall 07/2018 (Dr. Wynetta Emery at [facility])  . Hyperlipidemia   . Hypertension     Past Surgical History:  Procedure Laterality Date  . COLONOSCOPY W/ POLYPECTOMY  09/02/06; 07/2013   [facility]: adenomatous polyp w/out high grade dysplasia or malignancy.  Recall  07/2018  . dental implants    . PROSTATE BIOPSY  01/2012   Normal    Family History  Problem Relation Age of Onset  . Heart attack Mother   . Heart disease Mother   . Hyperlipidemia Mother   . Hypertension Mother   . Cancer Father     colon  . Cancer Sister     cervical/ remission  . Heart disease Brother   . Hyperlipidemia Brother   . Hypertension Brother   . Diabetes Sister     type 2  . Hypertension Sister   . Hypertension Sister   . Diabetes Sister     type 2    Social History   Social History  . Marital status: Married    Spouse name: N/A  . Number of children: N/A  . Years of education: N/A   Occupational History  . Not on file.   Social History Main Topics  . Smoking status: Never Smoker  . Smokeless tobacco: Never Used  . Alcohol use Yes     Comment: wine- socially  . Drug use: No  . Sexual activity: Yes    Partners: Female   Other Topics Concern  . Not on file   Social History Narrative   SH: Married, two teenagers (1 boy and 1 girl) as of 10/2015.   He is a  patent Chief Executive Officer, lives in [place], [place].   No Tobacco, rare ETOH, no drug use.   Exercise occasionally.             Outpatient Medications Prior to Visit  Medication Sig Dispense Refill  . aspirin 81 MG tablet Take 81 mg by mouth daily.      . Multiple Vitamin (MULTIVITAMIN) tablet Take 1 tablet by mouth daily.      . sildenafil (VIAGRA) 100 MG tablet Take 1 tablet (100 mg total) by mouth daily as needed for erectile dysfunction. 6 tablet 5  . telmisartan (MICARDIS) 40 MG tablet TAKE 1 TABLET BY MOUTH  DAILY 90 tablet 0  . rosuvastatin (CRESTOR) 10 MG tablet TAKE 1 TABLET BY MOUTH AT  BEDTIME 90 tablet 0   No facility-administered medications prior to visit.     Allergies  Allergen Reactions  . Ace Inhibitors Cough    ROS Review of Systems  Constitutional: Negative for appetite change, chills, fatigue and fever.  HENT: Negative for congestion, dental problem, ear pain and sore  throat.   Eyes: Negative for discharge, redness and visual disturbance.  Respiratory: Positive for shortness of breath (episodic--see HPI). Negative for cough, chest tightness and wheezing.   Cardiovascular: Negative for chest pain, palpitations and leg swelling.  Gastrointestinal: Negative for abdominal pain, blood in stool, diarrhea, nausea and vomiting.  Genitourinary: Negative for difficulty urinating, dysuria, flank pain, frequency, hematuria and urgency.  Musculoskeletal: Negative for arthralgias, back pain, joint swelling, myalgias and neck stiffness.  Skin: Negative for pallor and rash.  Neurological: Negative for dizziness, speech difficulty, weakness and headaches.  Hematological: Negative for adenopathy. Does not bruise/bleed easily.  Psychiatric/Behavioral: Positive for sleep disturbance (see HPI). Negative for confusion. The patient is not nervous/anxious.     PE; Blood pressure 137/86, pulse (!) 59, temperature 97.7 F (36.5 C), temperature source Oral, resp. rate 16, height 5' 5.25" (1.657 m), weight 168 lb 8 oz (76.4 kg), SpO2 96 %. Gen: Alert, well appearing.  Patient is oriented to person, place, time, and situation. AFFECT: pleasant, lucid thought and speech. ENT: Ears: EACs clear, normal epithelium.  TMs with good light reflex and landmarks bilaterally.  Eyes: no injection, icteris, swelling, or exudate.  EOMI, PERRLA. Nose: no drainage or turbinate edema/swelling.  No injection or focal lesion.  Mouth: lips without lesion/swelling.  Oral mucosa pink and moist.  Dentition intact and without obvious caries or gingival swelling.  Oropharynx without erythema, exudate, or swelling.  Neck: supple/nontender.  No LAD, mass, or TM.  Carotid pulses 2+ bilaterally, without bruits. CV: RRR, no m/r/g.   LUNGS: CTA bilat, nonlabored resps, good aeration in all lung fields. ABD: soft, NT, ND, BS normal.  No hepatospenomegaly or mass.  No bruits. EXT: no clubbing, cyanosis, or edema.   Musculoskeletal: no joint swelling, erythema, warmth, or tenderness.  ROM of all joints intact. Skin - no sores or suspicious lesions or rashes or color changes Rectal exam: negative without mass, lesions or tenderness, PROSTATE EXAM: smooth and symmetric without nodules or tenderness.   Pertinent labs:  Lab Results  Component Value Date   TSH 0.81 10/23/2015   Lab Results  Component Value Date   WBC 4.1 10/23/2015   HGB 16.3 10/23/2015   HCT 48.0 10/23/2015   MCV 92.2 10/23/2015   PLT 164.0 10/23/2015   Lab Results  Component Value Date   CREATININE 1.36 10/23/2015   BUN 21 10/23/2015   NA 138 10/23/2015   K 4.6 10/23/2015  CL 101 10/23/2015   CO2 30 10/23/2015   Lab Results  Component Value Date   ALT 39 10/23/2015   AST 29 10/23/2015   ALKPHOS 49 10/23/2015   BILITOT 0.7 10/23/2015   Lab Results  Component Value Date   CHOL 158 10/23/2015   Lab Results  Component Value Date   HDL 33.50 (L) 10/23/2015   Lab Results  Component Value Date   LDLCALC 97 10/23/2015   Lab Results  Component Value Date   TRIG 135.0 10/23/2015   Lab Results  Component Value Date   CHOLHDL 5 10/23/2015   Lab Results  Component Value Date   PSA 0.61 10/23/2015   PSA 0.59 07/03/2014   PSA 0.53 12/09/2012   Lab Results  Component Value Date   HGBA1C 5.9 10/23/2015   12 LEAD EKG today: sinus bradycardia, rate 54/min, no Q waves, no ST changes, normal intervals/duration, no ectopy.  No prior EKG for comparison.  ASSESSMENT AND PLAN:   1) Intermittent palpitations, SOB, and diaphoresis with climbing stairs.  Occurrence is not every time he climbs stairs, plus he runs on treadmill regularly without similar symptoms. Given these symptoms + strong FH of CAD, will refer pt to cardiology to see what next best step is for work-up. EKG today normal.  2) Maintenance insomnia: he does not feel any daytime fatigue from this.  Reassured.  No intervention at this time. Watchful  waiting.  3) Health maintenance exam: Reviewed age and gender appropriate health maintenance issues (prudent diet, regular exercise, health risks of tobacco and excessive alcohol, use of seatbelts, fire alarms in home, use of sunscreen).  Also reviewed age and gender appropriate health screening as well as vaccine recommendations. Declines flu vaccine today. Fasting HP labs today.  Hx of insulin resistance (A1c 5.9% 10/2015), repeat HbA1c today. Prostate cancer screening: DRE normal , PSA drawn today. Colon cancer screening: hx of adenomatous polyps.  Next colonoscopy due 07/2018.  An After Visit Summary was printed and given to the patient.  FOLLOW UP:  Return in about 6 months (around 06/10/2017) for routine chronic illness f/u.  Signed:  Crissie Sickles, MD           12/08/2016

## 2016-12-08 NOTE — Progress Notes (Signed)
Pre visit review using our clinic review tool, if applicable. No additional management support is needed unless otherwise documented below in the visit note. 

## 2016-12-09 ENCOUNTER — Encounter: Payer: Self-pay | Admitting: Family Medicine

## 2016-12-09 LAB — LDL CHOLESTEROL, DIRECT: Direct LDL: 72 mg/dL

## 2016-12-21 ENCOUNTER — Encounter: Payer: Self-pay | Admitting: *Deleted

## 2017-01-07 ENCOUNTER — Encounter: Payer: Self-pay | Admitting: Family Medicine

## 2017-02-16 ENCOUNTER — Other Ambulatory Visit: Payer: Self-pay | Admitting: Family Medicine

## 2017-02-16 NOTE — Telephone Encounter (Signed)
[facility].  RF request for telmisartan LOV: 12/08/16 Next ov: None Last written: 11/04/16 #90 w/ 0RF  RF request for rosuvastatin Last written: 12/08/16 #90 w/ 3RF

## 2017-03-14 DIAGNOSIS — R931 Abnormal findings on diagnostic imaging of heart and coronary circulation: Secondary | ICD-10-CM

## 2017-03-14 HISTORY — DX: Abnormal findings on diagnostic imaging of heart and coronary circulation: R93.1

## 2017-03-15 ENCOUNTER — Ambulatory Visit (INDEPENDENT_AMBULATORY_CARE_PROVIDER_SITE_OTHER): Payer: 59 | Admitting: Cardiology

## 2017-03-15 ENCOUNTER — Encounter: Payer: Self-pay | Admitting: Cardiology

## 2017-03-15 ENCOUNTER — Encounter (INDEPENDENT_AMBULATORY_CARE_PROVIDER_SITE_OTHER): Payer: Self-pay

## 2017-03-15 VITALS — BP 142/72 | HR 69 | Ht 65.0 in | Wt 167.8 lb

## 2017-03-15 DIAGNOSIS — R0609 Other forms of dyspnea: Secondary | ICD-10-CM

## 2017-03-15 DIAGNOSIS — Z8249 Family history of ischemic heart disease and other diseases of the circulatory system: Secondary | ICD-10-CM | POA: Diagnosis not present

## 2017-03-15 DIAGNOSIS — I10 Essential (primary) hypertension: Secondary | ICD-10-CM | POA: Diagnosis not present

## 2017-03-15 DIAGNOSIS — R002 Palpitations: Secondary | ICD-10-CM | POA: Diagnosis not present

## 2017-03-15 NOTE — Progress Notes (Signed)
PCP: Tammi Sou, MD  Clinic Note: Chief Complaint  Patient presents with  . Shortness of Breath    pt states some SOB     HPI: Thomas Spencer is a 51 y.o. male who is being seen today for the evaluation of episodes of SOB &  Sweating at the request of McGowen, Adrian Blackwater, MD.  Thomas Spencer was last seen on 12/08/2016 by Dr. Anitra Lauth -> he noted 2 episodes of shortness of breath and diaphoresis while climbing a flight of stairs. However he runs treadmill regularly and no problems. He felt his heart racing with palpitations and some flushing - no CP.  Just needs to try to catch his breath. The episodes lasted about 2-3 minutes, then returned normal. No chest pain. - He also complained about poor nighttime sleeping habits.  Recent Hospitalizations: none  Studies Personally Reviewed - (if available, images/films reviewed: From Epic Chart or Care Everywhere)  None (had a ST years ago)   Interval History: Thomas Spencer presents here today noting that he has been having random "spells" of shortness of breath with diffuse sweating that usually occur without any real exertion.  They do occur when he is doing things besides just sitting, but not with his routine exercise. For instance, he is able to go up and down flights of stairs as well as going morning of the [employer] and doing other work out there. He does not have any symptoms with these activities, but simply walking around or at work he has had these spells. 2 particular episodes were noted above. With the spells he says he does feel his heart "fluttering" not necessarily going fast just beating funny. His is unusual sensation in his chest but he did not describe his chest tightness or pressure. Interestingly, he says that these spells are not always associated with a heart fluttering. He thinks he maybe has one or maybe 2 a month, but may go a month without having an episode.   With the exception of these spells, he denies any other real cardiac  symptoms besides occasionally noting more than usual shortness of breath with exertion.  No chest pain with rest or exertion. No resting dyspnea. No PND, orthopnea or edema. No  lightheadedness, dizziness, weakness or syncope/near syncope. No TIA/amaurosis fugax symptoms. No melena, hematochezia, hematuria, or epstaxis. No claudication.  ROS: A comprehensive was performed. Review of Systems  Constitutional: Negative for malaise/fatigue.  HENT: Negative for congestion and nosebleeds.   Respiratory: Negative for cough and wheezing.   Cardiovascular:       Per history of present illness  Gastrointestinal: Negative.   Genitourinary: Negative.   Musculoskeletal: Negative.   Skin: Negative.   Neurological: Negative.   Psychiatric/Behavioral: Negative.   All other systems reviewed and are negative.  I have reviewed and (if needed) personally updated the patient's problem list, medications, allergies, past medical and surgical history, social and family history.   Past Medical History:  Diagnosis Date  . Acute epiglottitis 01/2014  . BPH with obstruction/lower urinary tract symptoms    [facility] referral done 01/[facility] (Dr. Risa Grill); ultimately a prostate biopsy was done for increased PSA velocity--April 2013--and it  was completely normal.  . Elevated liver function tests    mild  . Erectile dysfunction   . Family history of colon cancer    father  . Family history of hypertension    strong family history  . Family history of premature coronary heart disease    strong family history;  Mother (MI in 72s, then 17s, s/p Valve Sgx in 49s) & oldest Brother (CABG x 4 in 75s)  . History of sciatica 11/2013   responded well to prednisone burst 11/2013  . Hx of colonic polyp 2007; 2014   Recall 07/2018 (Dr. Wynetta Emery at [facility])  . Hyperlipidemia    On 10 mg Crestor  . Hypertension    On telmisartan  . Prediabetes 11/2016   HbA1c 6.1%     Past Surgical History:  Procedure Laterality  Date  . COLONOSCOPY W/ POLYPECTOMY  09/02/06; 07/2013   [facility]: adenomatous polyp w/out high grade dysplasia or malignancy.  Recall 07/2018  . dental implants    . PROSTATE BIOPSY  01/2012   Normal    No outpatient prescriptions have been marked as taking for the 03/15/17 encounter (Office Visit) with Leonie Man, MD.    Allergies  Allergen Reactions  . Ace Inhibitors Cough    Social History   Social History  . Marital status: Married    Spouse name: N/A  . Number of children: 2  . Years of education: N/A   Occupational History  . [employer] History Main Topics  . Smoking status: Never Smoker  . Smokeless tobacco: Never Used  . Alcohol use Yes     Comment: wine- socially  . Drug use: No  . Sexual activity: Yes    Partners: Female   Other Topics Concern  . None   Social History Narrative   SH: Married, two teenagers (1 boy and 1 girl) as of 10/2015.   He is a Personal assistant, lives in [place], [place].   He has a PhD in a vertical engineering any [employer] in intellectual property   No Tobacco, rare ETOH, no drug use.   Exercise occasionally - most days the week he goes to the gym.             family history includes Cancer in his father and sister; Coronary artery disease (age of onset: 59) in his brother; Diabetes in his sister and sister; Heart attack (age of onset: 42) in his mother; Heart disease (age of onset: 50) in his mother; Hyperlipidemia in his brother and mother; Hypertension in his brother, mother, sister, and sister; Valvular heart disease (age of onset: 19) in his mother.  Wt Readings from Last 3 Encounters:  03/15/17 167 lb 12.8 oz (76.1 kg)  12/08/16 168 lb 8 oz (76.4 kg)  10/23/15 162 lb 8 oz (73.7 kg)    PHYSICAL EXAM BP (!) 142/72 (BP Location: Right Arm, Patient Position: Sitting, Cuff Size: Normal)   Pulse 69   Ht 5\' 5"  (1.651 m)   Wt 167 lb 12.8 oz (76.1 kg)   BMI 27.92 kg/m  General appearance: alert,  cooperative, appears stated age, no distress. Healthy-appearing. Well-nourished and well-groomed. HEENT: /AT, EOMI, MMM, anicteric sclera Neck: no adenopathy, no carotid bruit and no JVD Lungs: clear to auscultation bilaterally, normal percussion bilaterally and non-labored Heart: regular rate and rhythm, S1 &S2 normal, no murmur, click, rub or gallop; nondisplaced PMI Abdomen: soft, non-tender; bowel sounds normal; no masses,  no organomegaly; no HJR Extremities: extremities normal, atraumatic, no cyanosis, or edema  Pulses: 2+ and symmetric;  Skin: mobility and turgor normal, no evidence of bleeding or bruising and no lesions noted Neurologic: Mental status: Alert & oriented x 3, thought content appropriate; non-focal exam.  Pleasant mood & affect. Cranial nerves: normal (  II-XII grossly intact)    Adult ECG Report  Rate: 69 ;  Rhythm: normal sinus rhythm and normal axis, intervals & durations.  Non-specific ST-T changes;   Narrative Interpretation: essentially normal EKG   Other studies Reviewed: Additional studies/ records that were reviewed today include:  Recent Labs:   Lab Results  Component Value Date   CREATININE 1.35 12/08/2016   BUN 17 12/08/2016   NA 136 12/08/2016   K 4.6 12/08/2016   CL 101 12/08/2016   CO2 31 12/08/2016   Lab Results  Component Value Date   CHOL 147 12/08/2016   HDL 29.80 (L) 12/08/2016   LDLCALC 97 10/23/2015   LDLDIRECT 72.0 12/08/2016   TRIG 220.0 (H) 12/08/2016   CHOLHDL 5 12/08/2016    ASSESSMENT / PLAN: Problem List Items Addressed This Visit    Dyspnea on exertion - Primary    Interestingly, he does have exertional dyspnea associated with these flushing diaphoretic spells, but it doesn't seem to be with his exercise related exertion.  Screening for coronary disease with coronary Calcium Score      Relevant Orders   EKG 12-Lead (Completed)   CT CARDIAC SCORING   Essential hypertension (Chronic)    Borderline pressures today,  but being his first visit with a cardiologist, I suspect there may be some angst. We can reassess and follow-up. He is on an ARB and usually well controlled.      Relevant Orders   EKG 12-Lead (Completed)   CT CARDIAC SCORING   Family history of premature coronary heart disease    Very strong family history with his mom having very early premature CAD. Given that he is having some atypical chest discomfort symptoms such as fluttering associated with dyspnea with some exertion, we will screen for coronary disease with a coronary calcium score. Pending results this study would consider either a GXT versus more complex stress test/CTA      Relevant Orders   EKG 12-Lead (Completed)   CT CARDIAC SCORING   Fluttering sensation of heart    These fluttering episodes that he has probably don't occur with enough frequency to guarantee capturing an event with a 30 day monitor. I discussed the options with him and he did not particularly want to wear a monitor for an entire month with the thought that it may not actually shown anything. With therefore discussed using his Smart watch application to monitor his heart rates if he were to have an episode and test for irregularity or not. He will look into potentially an application for his watch that will allow him to actually get rhythm strips.  We also discussed vagal maneuvers on the off chance that these episodes are SVT episodes.      Relevant Orders   EKG 12-Lead (Completed)   CT CARDIAC SCORING      Current medicines are reviewed at length with the patient today. (+/- concerns) n/a The following changes have been made: n/a  Patient Instructions  MONITOR you pulse  - document what you are doing when it occurs and timeframe    RECOMMEND [place] FLUTTERING Recommendations for vagal maneuvers:  "Bearing down"  Coughing  Gagging  Icy, cold towel on face or drink ice cold water   SCHEDULE AT [facility] has requested that you have cardiac CT CALCIUM SCORING. Cardiac computed tomography (CT) is a painless test that uses an x-ray machine to take clear, detailed pictures of your  heart. For further information please visit [email or web address]. Please follow instruction sheet as given.   Your physician recommends that you schedule a follow-up appointment in 1- 2 MONTHS WITH DR HARDING AFTER TEST IS COMPLETED.    Studies Ordered:   Orders Placed This Encounter  Procedures  . CT CARDIAC SCORING  . EKG 12-Lead      Glenetta Hew, M.D., M.S. Interventional Cardiologist   Pager # [phone] Phone # [phone] [address]. [place], [place] [address]

## 2017-03-15 NOTE — Patient Instructions (Addendum)
MONITOR you pulse  - document what you are doing when it occurs and timeframe    RECOMMEND [place] FLUTTERING Recommendations for vagal maneuvers:  "Bearing down"  Coughing  Gagging  Icy, cold towel on face or drink ice cold water   SCHEDULE AT [facility] has requested that you have cardiac CT CALCIUM SCORING. Cardiac computed tomography (CT) is a painless test that uses an x-ray machine to take clear, detailed pictures of your heart. For further information please visit [email or web address]. Please follow instruction sheet as given.   Your physician recommends that you schedule a follow-up appointment in 1- 2 MONTHS WITH DR HARDING AFTER TEST IS COMPLETED.

## 2017-03-16 DIAGNOSIS — R931 Abnormal findings on diagnostic imaging of heart and coronary circulation: Secondary | ICD-10-CM | POA: Insufficient documentation

## 2017-03-17 ENCOUNTER — Encounter: Payer: Self-pay | Admitting: Cardiology

## 2017-03-17 NOTE — Assessment & Plan Note (Addendum)
These fluttering episodes that he has probably don't occur with enough frequency to guarantee capturing an event with a 30 day monitor. I discussed the options with him and he did not particularly want to wear a monitor for an entire month with the thought that it may not actually shown anything. With therefore discussed using his Smart watch application to monitor his heart rates if he were to have an episode and test for irregularity or not. He will look into potentially an application for his watch that will allow him to actually get rhythm strips.  We also discussed vagal maneuvers on the off chance that these episodes are SVT episodes.

## 2017-03-17 NOTE — Assessment & Plan Note (Signed)
Very strong family history with his mom having very early premature CAD. Given that he is having some atypical chest discomfort symptoms such as fluttering associated with dyspnea with some exertion, we will screen for coronary disease with a coronary calcium score. Pending results this study would consider either a GXT versus more complex stress test/CTA

## 2017-03-17 NOTE — Assessment & Plan Note (Signed)
Interestingly, he does have exertional dyspnea associated with these flushing diaphoretic spells, but it doesn't seem to be with his exercise related exertion.  Screening for coronary disease with coronary Calcium Score

## 2017-03-17 NOTE — Assessment & Plan Note (Signed)
Borderline pressures today, but being his first visit with a cardiologist, I suspect there may be some angst. We can reassess and follow-up. He is on an ARB and usually well controlled.

## 2017-03-22 ENCOUNTER — Inpatient Hospital Stay: Admission: RE | Admit: 2017-03-22 | Payer: 59 | Source: Ambulatory Visit

## 2017-03-26 ENCOUNTER — Ambulatory Visit (INDEPENDENT_AMBULATORY_CARE_PROVIDER_SITE_OTHER)
Admission: RE | Admit: 2017-03-26 | Discharge: 2017-03-26 | Disposition: A | Discharge status: Home / Self Care | Payer: Self-pay | Source: Ambulatory Visit | Attending: Cardiology | Admitting: Cardiology

## 2017-03-26 DIAGNOSIS — I10 Essential (primary) hypertension: Secondary | ICD-10-CM

## 2017-03-26 DIAGNOSIS — R002 Palpitations: Secondary | ICD-10-CM

## 2017-03-26 DIAGNOSIS — R0609 Other forms of dyspnea: Secondary | ICD-10-CM

## 2017-03-26 DIAGNOSIS — Z8249 Family history of ischemic heart disease and other diseases of the circulatory system: Secondary | ICD-10-CM

## 2017-03-28 ENCOUNTER — Encounter: Payer: Self-pay | Admitting: Family Medicine

## 2017-04-16 ENCOUNTER — Encounter: Payer: Self-pay | Admitting: Cardiology

## 2017-04-16 ENCOUNTER — Ambulatory Visit (INDEPENDENT_AMBULATORY_CARE_PROVIDER_SITE_OTHER): Payer: 59 | Admitting: Cardiology

## 2017-04-16 VITALS — BP 134/85 | HR 62 | Ht 65.0 in | Wt 163.6 lb

## 2017-04-16 DIAGNOSIS — R0609 Other forms of dyspnea: Secondary | ICD-10-CM | POA: Diagnosis not present

## 2017-04-16 DIAGNOSIS — Z8249 Family history of ischemic heart disease and other diseases of the circulatory system: Secondary | ICD-10-CM | POA: Diagnosis not present

## 2017-04-16 DIAGNOSIS — I209 Angina pectoris, unspecified: Secondary | ICD-10-CM | POA: Diagnosis not present

## 2017-04-16 DIAGNOSIS — Z8679 Personal history of other diseases of the circulatory system: Secondary | ICD-10-CM | POA: Insufficient documentation

## 2017-04-16 DIAGNOSIS — Z01818 Encounter for other preprocedural examination: Secondary | ICD-10-CM

## 2017-04-16 DIAGNOSIS — I10 Essential (primary) hypertension: Secondary | ICD-10-CM | POA: Diagnosis not present

## 2017-04-16 DIAGNOSIS — E785 Hyperlipidemia, unspecified: Secondary | ICD-10-CM | POA: Diagnosis not present

## 2017-04-16 DIAGNOSIS — R002 Palpitations: Secondary | ICD-10-CM | POA: Diagnosis not present

## 2017-04-16 DIAGNOSIS — D689 Coagulation defect, unspecified: Secondary | ICD-10-CM | POA: Diagnosis not present

## 2017-04-16 DIAGNOSIS — R931 Abnormal findings on diagnostic imaging of heart and coronary circulation: Secondary | ICD-10-CM | POA: Diagnosis not present

## 2017-04-16 MED ORDER — ISOSORBIDE MONONITRATE ER 30 MG PO TB24: 30.0000 mg | ORAL_TABLET | Freq: Every day | ORAL | 3 refills | Status: DC

## 2017-04-16 MED ORDER — NITROGLYCERIN 0.4 MG SL SUBL: 0.4000 mg | SUBLINGUAL_TABLET | SUBLINGUAL | 3 refills | Status: DC | PRN

## 2017-04-16 MED ORDER — ROSUVASTATIN CALCIUM 20 MG PO TABS: 20.0000 mg | ORAL_TABLET | Freq: Every day | ORAL | 3 refills | Status: DC

## 2017-04-16 NOTE — Patient Instructions (Signed)
[facility] [address] [place] [address] Dept: [phone] Loc: [phone]  Thomas Spencer  04/16/2017  You are scheduled for a Cardiac Catheterization on Tuesday, August 21 with Dr. Glenetta Hew.  1. Please arrive at the [facility] (Main Entrance A) at [facility]: [address] [place], [place] [address] at 5:30 AM (two hours before your procedure to ensure your preparation). Free valet parking service is available.   Special note: Every effort is made to have your procedure done on time. Please understand that emergencies sometimes delay scheduled procedures.  2. Diet: Do not eat or drink anything after midnight prior to your procedure except sips of water to take medications.  3. Labs: You will need to have blood drawn on Wednesday, August 8 at [address], [place]  Open: 8am - 4:30pm (Lunch 12:30 - 1:30). You do not need to be fasting.  4. Medication instructions in preparation for your procedure: N/A   On the morning of your procedure, take your Aspirin 81 MG and any morning medicines NOT listed above.  You may use sips of water.  5. Plan for one night stay--bring personal belongings. 6. Bring a current list of your medications and current insurance cards. 7. You MUST have a responsible person to drive you home. 8. Someone MUST be with you the first 24 hours after you arrive home or your discharge will be delayed. 9. Please wear clothes that are easy to get on and off and wear slip-on shoes.  Thank you for allowing [place] to care for you!   -- [facility]  Medication addition and instructions  START ISOSORBIDE MN 30 MG -- ONE TABLET DAILY   MAY USE NITROGLYCERIN SUBLINGUAL TABLETS IF NEEDED   DO NOT USE VIAGRA AT ALL UNTIL AFTER PROCEURES AND DOCTOR GIVE THE OKAY,  INCREASE CRESTOR( GENERIC) 20 MG  DAILY    Your physician recommends that you schedule a follow-up appointment in [place].      Nitroglycerin sublingual tablets What is this medicine? NITROGLYCERIN (nye troe GLI ser in) is a type of vasodilator. It relaxes blood vessels, increasing the blood and oxygen supply to your heart. This medicine is used to relieve chest pain caused by angina. It is also used to prevent chest pain before activities like climbing stairs, going outdoors in cold weather, or sexual activity. This medicine may be used for other purposes; ask your health care provider or pharmacist if you have questions. COMMON BRAND NAME(S): Nitroquick, Nitrostat, Nitrotab What should I tell my health care provider before I take this medicine? They need to know if you have any of these conditions: -anemia -head injury, recent stroke, or bleeding in the brain -liver disease -previous heart attack -an unusual or allergic reaction to nitroglycerin, other medicines, foods, dyes, or preservatives -pregnant or trying to get pregnant -breast-feeding How should I use this medicine? Take this medicine by mouth as needed. At the first sign of an angina attack (chest pain or tightness) place one tablet under your tongue. You can also take this medicine 5 to 10 minutes before an event likely to produce chest pain. Follow the directions on the prescription label. Let the tablet dissolve under the tongue. Do not swallow whole. Replace the dose if you accidentally swallow it. It will help if your mouth is not dry. Saliva around the  tablet will help it to dissolve more quickly. Do not eat or drink, smoke or chew tobacco while a tablet is dissolving. If you are not better within 5 minutes after taking ONE dose of nitroglycerin, call 9-1-1 immediately to seek emergency medical care. Do not take more than 3 nitroglycerin tablets over 15 minutes. If you take this medicine often to relieve symptoms of angina,  your doctor or health care professional may provide you with different instructions to manage your symptoms. If symptoms do not go away after following these instructions, it is important to call 9-1-1 immediately. Do not take more than 3 nitroglycerin tablets over 15 minutes. Talk to your pediatrician regarding the use of this medicine in children. Special care may be needed. Overdosage: If you think you have taken too much of this medicine contact a poison control center or emergency room at once. NOTE: This medicine is only for you. Do not share this medicine with others. What if I miss a dose? This does not apply. This medicine is only used as needed. What may interact with this medicine? Do not take this medicine with any of the following medications: -certain migraine medicines like ergotamine and dihydroergotamine (DHE) -medicines used to treat erectile dysfunction like sildenafil, tadalafil, and vardenafil -riociguat This medicine may also interact with the following medications: -alteplase -aspirin -heparin -medicines for high blood pressure -medicines for mental depression -other medicines used to treat angina -phenothiazines like chlorpromazine, mesoridazine, prochlorperazine, thioridazine This list may not describe all possible interactions. Give your health care provider a list of all the medicines, herbs, non-prescription drugs, or dietary supplements you use. Also tell them if you smoke, drink alcohol, or use illegal drugs. Some items may interact with your medicine. What should I watch for while using this medicine? Tell your doctor or health care professional if you feel your medicine is no longer working. Keep this medicine with you at all times. Sit or lie down when you take your medicine to prevent falling if you feel dizzy or faint after using it. Try to remain calm. This will help you to feel better faster. If you feel dizzy, take several deep breaths and lie down with  your feet propped up, or bend forward with your head resting between your knees. You may get drowsy or dizzy. Do not drive, use machinery, or do anything that needs mental alertness until you know how this drug affects you. Do not stand or sit up quickly, especially if you are an older patient. This reduces the risk of dizzy or fainting spells. Alcohol can make you more drowsy and dizzy. Avoid alcoholic drinks. Do not treat yourself for coughs, colds, or pain while you are taking this medicine without asking your doctor or health care professional for advice. Some ingredients may increase your blood pressure. What side effects may I notice from receiving this medicine? Side effects that you should report to your doctor or health care professional as soon as possible: -blurred vision -dry mouth -skin rash -sweating -the feeling of extreme pressure in the head -unusually weak or tired Side effects that usually do not require medical attention (report to your doctor or health care professional if they continue or are bothersome): -flushing of the face or neck -headache -irregular heartbeat, palpitations -nausea, vomiting This list may not describe all possible side effects. Call your doctor for medical advice about side effects. You may report side effects to FDA at 1-800-FDA-1088. Where should I keep my medicine? Keep out of the  reach of children. Store at room temperature between 20 and 25 degrees C (68 and 77 degrees F). Store in [employer]. Protect from light and moisture. Keep tightly closed. Throw away any unused medicine after the expiration date. NOTE: This sheet is a summary. It may not cover all possible information. If you have questions about this medicine, talk to your doctor, pharmacist, or health care provider.  2018 [employer] Standard (2013-06-29 17:57:36)

## 2017-04-16 NOTE — Progress Notes (Signed)
 PCP: Spencer, Thomas H, MD  Clinic Note: Chief Complaint  Patient presents with  . Follow-up    DOE & chest discomfort --> Coronary CTA results    HPI: Thomas Spencer is a 51 y.o. male who is being seen today for follow-up evaluation of shortness of breath and sweating at the request of Spencer, Thomas H, MD.  Thomas Spencer was originally seen on 03/15/2017. He had noted shortness of breath and diaphoresis, climbing stair , but did not have any symptoms on his treadmill. He also noted some heart fluttering. We decided evaluated with CT coronary calcium scoring - this was read as abnormal and he therefore returned to discuss the results for possible progression to stress test versus catheterization.  Recent Hospitalizations: None  Studies Personally Reviewed - (if available, images/films reviewed: From Epic Chart or Care Everywhere)  Coronary calcium score of 343 suggesting 3 vessel coronary disease in the proximal vessels.  Interval History: Thomas Spencer returns today for f/u & to discuss the results of his Coronary Calcium Score.  He still notes having exertional dyspnea & chest tightness.  He notes 3 specific examples of prolonged dyspnea & chest discomfort - 2 of which were while walking upstairs & the 3rd was when he was rushing around to get some odd jobs done.  He has not been doing his routine workout regimen at the gym since his symptoms began, but is still doing walking exercises - just not pushing it much. He denies any dyspnea or chest pain at rest.  No PND, orthopnea or edema. No palpitations, lightheadedness, dizziness, weakness or syncope/near syncope. No TIA/amaurosis fugax symptoms. No claudication.  ROS: A comprehensive was performed. Review of Systems  Constitutional: Negative for malaise/fatigue.  HENT: Negative for congestion and nosebleeds.   Respiratory: Negative for cough, shortness of breath (only as noted in HPI.) and wheezing.   Gastrointestinal: Negative for  blood in stool and melena.  Genitourinary: Negative for hematuria.  Musculoskeletal: Negative for joint pain and myalgias.  Psychiatric/Behavioral: The patient is nervous/anxious (since these episodes started to happen).   All other systems reviewed and are negative.  I have reviewed and (if needed) personally updated the patient's problem list, medications, allergies, past medical and surgical history, social and family history.   Past Medical History:  Diagnosis Date  . Acute epiglottitis 01/2014  . BPH with obstruction/lower urinary tract symptoms    [facility] referral done 01/2011 (Dr. Grapey); ultimately a prostate biopsy was done for increased PSA velocity--April 2013--and it  was completely normal.  . Elevated liver function tests    mild  . Erectile dysfunction   . Family history of colon cancer    father  . Family history of hypertension    strong family history  . Family history of premature coronary heart disease    strong family history; Mother (MI in 40s, then 50s, s/p Valve Sgx in 70s) & oldest Brother (CABG x 4 in 60s)  . History of sciatica 11/2013   responded well to prednisone burst 11/2013  . Hx of colonic polyp 2007; 2014   Recall 07/2018 (Dr. Johnson at [facility])  . Hyperlipidemia    On 10 mg Crestor  . Hypertension    On telmisartan  . Prediabetes 11/2016   HbA1c 6.1%     Past Surgical History:  Procedure Laterality Date  . COLONOSCOPY W/ POLYPECTOMY  09/02/06; 07/2013   [facility]: adenomatous polyp w/out high grade dysplasia or malignancy.  Recall 07/2018  . coronary   calcium score     Very high risk: as of 03/2017 pt is to return to discuss with Dr. Anh Bigos: stress test vs cath as next step.  . dental implants    . PROSTATE BIOPSY  01/2012   Normal    Current Meds  Medication Sig  . aspirin 81 MG tablet Take 81 mg by mouth daily.    . Multiple Vitamin (MULTIVITAMIN) tablet Take 1 tablet by mouth daily.    . sildenafil (VIAGRA) 100 MG tablet Take  1 tablet (100 mg total) by mouth daily as needed for erectile dysfunction.  . telmisartan (MICARDIS) 40 MG tablet TAKE 1 TABLET BY MOUTH  DAILY  . [DISCONTINUED] rosuvastatin (CRESTOR) 10 MG tablet TAKE 1 TABLET BY MOUTH AT  BEDTIME    Allergies  Allergen Reactions  . Ace Inhibitors Cough    Social History   Social History  . Marital status: Married    Spouse name: N/A  . Number of children: 2  . Years of education: N/A   Occupational History  . Patent Lawyer     [employer]   Social History Main Topics  . Smoking status: Never Smoker  . Smokeless tobacco: Never Used  . Alcohol use Yes     Comment: wine- socially  . Drug use: No  . Sexual activity: Yes    Partners: Female   Other Topics Concern  . None   Social History Narrative   SH: Married, two teenagers (1 boy and 1 girl) as of 10/2015.   He is a patent lawyer, lives in [place], [place].   He has a PhD in a vertical engineering any [employer] in intellectual property   No Tobacco, rare ETOH, no drug use.   Exercise occasionally - most days the week he goes to the gym.             family history includes Cancer in his father and sister; Coronary artery disease (age of onset: 60) in his brother; Diabetes in his sister and sister; Heart attack (age of onset: 40) in his mother; Heart disease (age of onset: 50) in his mother; Hyperlipidemia in his brother and mother; Hypertension in his brother, mother, sister, and sister; Valvular heart disease (age of onset: 70) in his mother.  Wt Readings from Last 3 Encounters:  04/16/17 163 lb 9.6 oz (74.2 kg)  03/15/17 167 lb 12.8 oz (76.1 kg)  12/08/16 168 lb 8 oz (76.4 kg)    PHYSICAL EXAM BP 134/85   Pulse 62   Ht 5' 5" (1.651 m)   Wt 163 lb 9.6 oz (74.2 kg)   BMI 27.22 kg/m  Physical Exam  Constitutional: He is oriented to person, place, and time. He appears well-developed and well-nourished. No distress.  HENT:  Head: Normocephalic and atraumatic.  Nose: Nose normal.    Mouth/Throat: Oropharynx is clear and moist. No oropharyngeal exudate.  Eyes: Pupils are equal, round, and reactive to light. Conjunctivae and EOM are normal. Right eye exhibits no discharge. Left eye exhibits no discharge. No scleral icterus.  Cardiovascular: Normal rate, regular rhythm, normal heart sounds and intact distal pulses.  Exam reveals no gallop and no friction rub.   No murmur heard. Non-displaced PMI. No HJR or JVD  Pulmonary/Chest: Effort normal and breath sounds normal. No respiratory distress. He has no wheezes. He has no rales. He exhibits no tenderness.  Abdominal: Soft. Bowel sounds are normal. He exhibits no distension. There is no tenderness.  Musculoskeletal: Normal range of   motion. He exhibits no edema or deformity.  Neurological: He is alert and oriented to person, place, and time. No cranial nerve deficit.  Skin: Skin is warm and dry. No rash noted. No erythema.  Psychiatric: He has a normal mood and affect. His behavior is normal. Judgment and thought content normal.  Nursing note and vitals reviewed.   Adult ECG Report n/a  Other studies Reviewed: Additional studies/ records that were reviewed today include:  Recent Labs:   Lab Results  Component Value Date   CREATININE 1.35 12/08/2016   BUN 17 12/08/2016   NA 136 12/08/2016   K 4.6 12/08/2016   CL 101 12/08/2016   CO2 31 12/08/2016    ASSESSMENT / PLAN: Problem List Items Addressed This Visit    Agatston coronary artery calcium score between 200 and 399    High coronary calcium score in a patient with symptoms. The calcium score suggests three-vessel disease. Basilar fact that he still has his concerning symptoms for possible angina, I think the best course of action is to proceed with cardiac catheterization.      Relevant Orders   LEFT HEART CATHETERIZATION WITH CORONARY ANGIOGRAM   Angina, class III (HCC) - Primary (Chronic)    With the results of his coronary calcium score, and persistent  symptoms that are somewhat concerning for possible angina, I would say these are probably close to class III anginal symptoms and I think probably the best course of action would be to proceed with diagnostic evaluation using cardiac catheterization as opposed to a noninvasive test. He has a significant family history with his brother just recently having bypass surgery. He himself has hypertension and dyslipidemia.  Plan: Left Heart Catheterization with Coronary Angiography and Possible Percutaneous Coronary Intervention. He has plans to be out of town the end of next week for his son's completion of [employer] @ [employer]. -- Will schedule cath for the following Tuesday (after I return from vacation) -- he stands that he should limit his activity and we will prescribe nitroglycerin glycerin as well as Imdur.      Relevant Medications   isosorbide mononitrate (IMDUR) 30 MG 24 hr tablet   nitroGLYCERIN (NITROSTAT) 0.4 MG SL tablet   rosuvastatin (CRESTOR) 20 MG tablet   Other Relevant Orders   LEFT HEART CATHETERIZATION WITH CORONARY ANGIOGRAM   Dyspnea on exertion    Associated with diaphoresis and some chest discomfort now. In the setting of abnormal calcium score, plan is to proceed with cardiac catheterization to exclude ischemic CAD.      Relevant Orders   Basic metabolic panel   CBC   LEFT HEART CATHETERIZATION WITH CORONARY ANGIOGRAM   Essential hypertension (Chronic)    Well-controlled today on ARB. Depending on what we see in the Cath Lab, may need to consider beta blocker.      Relevant Medications   isosorbide mononitrate (IMDUR) 30 MG 24 hr tablet   nitroGLYCERIN (NITROSTAT) 0.4 MG SL tablet   rosuvastatin (CRESTOR) 20 MG tablet   Family history of premature coronary heart disease   Relevant Orders   LEFT HEART CATHETERIZATION WITH CORONARY ANGIOGRAM   Fluttering sensation of heart    Continue to monitor these symptoms. I don't think they're occurring at a frequency  to capture. He is not had any more episodes since I last saw him. He will see the follow on his Smart watch.      Hyperlipidemia with target low density lipoprotein (LDL) cholesterol less than 70   mg/dL (Chronic)    He didn't do well with atorvastatin or simvastatin. He is now on Crestor 20 mg daily. Tolerating that well. That followed by PCP. Target now is more vigorous given his cardiac calcium score and symptoms.      Relevant Medications   isosorbide mononitrate (IMDUR) 30 MG 24 hr tablet   nitroGLYCERIN (NITROSTAT) 0.4 MG SL tablet   rosuvastatin (CRESTOR) 20 MG tablet    Other Visit Diagnoses    Pre-op testing       Relevant Orders   Basic metabolic panel   Protime-INR   CBC   Clotting disorder (HCC)       Relevant Orders   Protime-INR     Performing MD:  Teasia Zapf, MD, M.S.  Procedure:  LEFT HEART CATHETERIZATION WITH CORONARY ANGIOGRAPHY & POSSIBLE PERCUTANEOUS CORONARY INTERVENTION.  The procedure with Risks/Benefits/Alternatives and Indications was reviewed with the patient & his wife.  All questions were answered.    Risks / Complications include, but not limited to: Death, MI, CVA/TIA, VF/VT (with defibrillation), Bradycardia (need for temporary pacer placement), contrast induced nephropathy, bleeding / bruising / hematoma / pseudoaneurysm, vascular or coronary injury (with possible emergent CT or Vascular Surgery), adverse medication reactions, infection.  Additional risks involving the use of radiation with the possibility of radiation burns and cancer were explained in detail.  The patient and wife voice understanding and agree to proceed.     Current medicines are reviewed at length with the patient today. (+/- concerns) n/a The following changes have been made: n/a  Patient Instructions    [facility] [address] [place] [address] Dept: [phone] Loc:  [phone]  Amaziah Biasi  04/16/2017  You are scheduled for a Cardiac Catheterization on Tuesday, August 21 with Dr. Rahima Fleishman.  1. Please arrive at the North Tower (Main Entrance A) at [facility]: [address] [place], [place] [address] at 5:30 AM (two hours before your procedure to ensure your preparation). Free valet parking service is available.   Special note: Every effort is made to have your procedure done on time. Please understand that emergencies sometimes delay scheduled procedures.  2. Diet: Do not eat or drink anything after midnight prior to your procedure except sips of water to take medications.  3. Labs: You will need to have blood drawn on Wednesday, August 8 at [facility] [address], [place]  Open: 8am - 4:30pm (Lunch 12:30 - 1:30). You do not need to be fasting.  4. Medication instructions in preparation for your procedure: N/A   On the morning of your procedure, take your Aspirin 81 MG and any morning medicines NOT listed above.  You may use sips of water.  5. Plan for one night stay--bring personal belongings. 6. Bring a current list of your medications and current insurance cards. 7. You MUST have a responsible person to drive you home. 8. Someone MUST be with you the first 24 hours after you arrive home or your discharge will be delayed. 9. Please wear clothes that are easy to get on and off and wear slip-on shoes.  Thank you for allowing us to care for you!   -- [facility]  Medication addition and instructions  START ISOSORBIDE MN 30 MG -- ONE TABLET DAILY   MAY USE NITROGLYCERIN SUBLINGUAL TABLETS IF NEEDED   DO NOT USE VIAGRA AT ALL UNTIL AFTER PROCEURES AND   DOCTOR GIVE THE OKAY,  INCREASE CRESTOR( GENERIC) 20 MG DAILY    Your physician recommends that you schedule a follow-up appointment in THE FIRST WEEK OF SEPT WITH DR Linnie Delgrande.      Nitroglycerin sublingual  tablets What is this medicine? NITROGLYCERIN (nye troe GLI ser in) is a type of vasodilator. It relaxes blood vessels, increasing the blood and oxygen supply to your heart. This medicine is used to relieve chest pain caused by angina. It is also used to prevent chest pain before activities like climbing stairs, going outdoors in cold weather, or sexual activity. This medicine may be used for other purposes; ask your health care provider or pharmacist if you have questions. COMMON BRAND NAME(S): Nitroquick, Nitrostat, Nitrotab What should I tell my health care provider before I take this medicine? They need to know if you have any of these conditions: -anemia -head injury, recent stroke, or bleeding in the brain -liver disease -previous heart attack -an unusual or allergic reaction to nitroglycerin, other medicines, foods, dyes, or preservatives -pregnant or trying to get pregnant -breast-feeding How should I use this medicine? Take this medicine by mouth as needed. At the first sign of an angina attack (chest pain or tightness) place one tablet under your tongue. You can also take this medicine 5 to 10 minutes before an event likely to produce chest pain. Follow the directions on the prescription label. Let the tablet dissolve under the tongue. Do not swallow whole. Replace the dose if you accidentally swallow it. It will help if your mouth is not dry. Saliva around the tablet will help it to dissolve more quickly. Do not eat or drink, smoke or chew tobacco while a tablet is dissolving. If you are not better within 5 minutes after taking ONE dose of nitroglycerin, call 9-1-1 immediately to seek emergency medical care. Do not take more than 3 nitroglycerin tablets over 15 minutes. If you take this medicine often to relieve symptoms of angina, your doctor or health care professional may provide you with different instructions to manage your symptoms. If symptoms do not go away after following these  instructions, it is important to call 9-1-1 immediately. Do not take more than 3 nitroglycerin tablets over 15 minutes. Talk to your pediatrician regarding the use of this medicine in children. Special care may be needed. Overdosage: If you think you have taken too much of this medicine contact a poison control center or emergency room at once. NOTE: This medicine is only for you. Do not share this medicine with others. What if I miss a dose? This does not apply. This medicine is only used as needed. What may interact with this medicine? Do not take this medicine with any of the following medications: -certain migraine medicines like ergotamine and dihydroergotamine (DHE) -medicines used to treat erectile dysfunction like sildenafil, tadalafil, and vardenafil -riociguat This medicine may also interact with the following medications: -alteplase -aspirin -heparin -medicines for high blood pressure -medicines for mental depression -other medicines used to treat angina -phenothiazines like chlorpromazine, mesoridazine, prochlorperazine, thioridazine This list may not describe all possible interactions. Give your health care provider a list of all the medicines, herbs, non-prescription drugs, or dietary supplements you use. Also tell them if you smoke, drink alcohol, or use illegal drugs. Some items may interact with your medicine. What should I watch for while using this medicine? Tell your doctor or health care professional if you feel your medicine is no longer working. Keep this medicine with you at   all times. Sit or lie down when you take your medicine to prevent falling if you feel dizzy or faint after using it. Try to remain calm. This will help you to feel better faster. If you feel dizzy, take several deep breaths and lie down with your feet propped up, or bend forward with your head resting between your knees. You may get drowsy or dizzy. Do not drive, use machinery, or do anything that  needs mental alertness until you know how this drug affects you. Do not stand or sit up quickly, especially if you are an older patient. This reduces the risk of dizzy or fainting spells. Alcohol can make you more drowsy and dizzy. Avoid alcoholic drinks. Do not treat yourself for coughs, colds, or pain while you are taking this medicine without asking your doctor or health care professional for advice. Some ingredients may increase your blood pressure. What side effects may I notice from receiving this medicine? Side effects that you should report to your doctor or health care professional as soon as possible: -blurred vision -dry mouth -skin rash -sweating -the feeling of extreme pressure in the head -unusually weak or tired Side effects that usually do not require medical attention (report to your doctor or health care professional if they continue or are bothersome): -flushing of the face or neck -headache -irregular heartbeat, palpitations -nausea, vomiting This list may not describe all possible side effects. Call your doctor for medical advice about side effects. You may report side effects to FDA at 1-800-FDA-1088. Where should I keep my medicine? Keep out of the reach of children. Store at room temperature between 20 and 25 degrees C (68 and 77 degrees F). Store in original container. Protect from light and moisture. Keep tightly closed. Throw away any unused medicine after the expiration date. NOTE: This sheet is a summary. It may not cover all possible information. If you have questions about this medicine, talk to your doctor, pharmacist, or health care provider.  2018 Elsevier/Gold Standard (2013-06-29 17:57:36)     Studies Ordered:   Orders Placed This Encounter  Procedures  . Basic metabolic panel  . Protime-INR  . CBC  . LEFT HEART CATHETERIZATION WITH CORONARY ANGIOGRAM      Gael Delude, M.D., M.S. Interventional Cardiologist   Pager # [phone] Phone #  [phone] [address] [place], [place] [address] 

## 2017-04-18 ENCOUNTER — Encounter: Payer: Self-pay | Admitting: Cardiology

## 2017-04-18 NOTE — Assessment & Plan Note (Signed)
He didn't do well with atorvastatin or simvastatin. He is now on Crestor 20 mg daily. Tolerating that well. That followed by PCP. Target now is more vigorous given his cardiac calcium score and symptoms.

## 2017-04-18 NOTE — Assessment & Plan Note (Signed)
Continue to monitor these symptoms. I don't think they're occurring at a frequency to capture. He is not had any more episodes since I last saw him. He will see the follow on his Smart watch.

## 2017-04-18 NOTE — Assessment & Plan Note (Addendum)
With the results of his coronary calcium score, and persistent symptoms that are somewhat concerning for possible angina, I would say these are probably close to class III anginal symptoms and I think probably the best course of action would be to proceed with diagnostic evaluation using cardiac catheterization as opposed to a noninvasive test. He has a significant family history with his brother just recently having bypass surgery. He himself has hypertension and dyslipidemia.  Plan: Left Heart Catheterization with Coronary Angiography and Possible Percutaneous Coronary Intervention. He has plans to be out of town the end of next week for his son's completion of [employer] @ [employer]. -- Will schedule cath for the following Tuesday (after I return from vacation) -- he stands that he should limit his activity and we will prescribe nitroglycerin glycerin as well as Imdur.

## 2017-04-18 NOTE — Assessment & Plan Note (Signed)
Associated with diaphoresis and some chest discomfort now. In the setting of abnormal calcium score, plan is to proceed with cardiac catheterization to exclude ischemic CAD.

## 2017-04-18 NOTE — Assessment & Plan Note (Signed)
High coronary calcium score in a patient with symptoms. The calcium score suggests three-vessel disease. Basilar fact that he still has his concerning symptoms for possible angina, I think the best course of action is to proceed with cardiac catheterization.

## 2017-04-18 NOTE — Assessment & Plan Note (Signed)
Well-controlled today on ARB. Depending on what we see in the Cath Lab, may need to consider beta blocker.

## 2017-04-19 ENCOUNTER — Other Ambulatory Visit: Payer: Self-pay | Admitting: *Deleted

## 2017-04-19 DIAGNOSIS — R9430 Abnormal result of cardiovascular function study, unspecified: Secondary | ICD-10-CM

## 2017-04-19 DIAGNOSIS — Z01818 Encounter for other preprocedural examination: Secondary | ICD-10-CM

## 2017-04-20 ENCOUNTER — Telehealth: Payer: Self-pay | Admitting: Cardiology

## 2017-04-20 DIAGNOSIS — D689 Coagulation defect, unspecified: Secondary | ICD-10-CM | POA: Diagnosis not present

## 2017-04-20 DIAGNOSIS — R0609 Other forms of dyspnea: Secondary | ICD-10-CM | POA: Diagnosis not present

## 2017-04-20 DIAGNOSIS — Z01818 Encounter for other preprocedural examination: Secondary | ICD-10-CM | POA: Diagnosis not present

## 2017-04-20 LAB — CBC
HEMATOCRIT: 47.1 % (ref 37.5–51.0)
Hemoglobin: 15.9 g/dL (ref 13.0–17.7)
MCH: 31.5 pg (ref 26.6–33.0)
MCHC: 33.8 g/dL (ref 31.5–35.7)
MCV: 94 fL (ref 79–97)
Platelets: 158 10*3/uL (ref 150–379)
RBC: 5.04 x10E6/uL (ref 4.14–5.80)
RDW: 13 % (ref 12.3–15.4)
WBC: 4.5 10*3/uL (ref 3.4–10.8)

## 2017-04-20 LAB — BASIC METABOLIC PANEL
BUN/Creatinine Ratio: 14 (ref 9–20)
BUN: 19 mg/dL (ref 6–24)
CALCIUM: 9.8 mg/dL (ref 8.7–10.2)
CO2: 25 mmol/L (ref 20–29)
Chloride: 102 mmol/L (ref 96–106)
Creatinine, Ser: 1.36 mg/dL — ABNORMAL HIGH (ref 0.76–1.27)
GFR, EST AFRICAN AMERICAN: 68 mL/min/{1.73_m2} (ref >59)
GFR, EST NON AFRICAN AMERICAN: 59 mL/min/{1.73_m2} — AB (ref >59)
Glucose: 94 mg/dL (ref 65–99)
POTASSIUM: 5 mmol/L (ref 3.5–5.2)
Sodium: 138 mmol/L (ref 134–144)

## 2017-04-20 LAB — PROTIME-INR
INR: 1 (ref 0.8–1.2)
Prothrombin Time: 10.5 s (ref 9.1–12.0)

## 2017-04-20 NOTE — Telephone Encounter (Signed)
Thomas Spencer is calling about his Medication Long Acting Nitroglycerin tablets . He wants to know if he can break the pill in half . He has tried one and it didn't work out well . Please call

## 2017-04-20 NOTE — Telephone Encounter (Signed)
OKAY TO TAKE 15 MG ISOSORBIDE FOR 3- 4 NIGHTS AND THEN RETRY 30 MG  AT BEDTIME. TAKE DAILY ASPIRIN 30- 60 MIN PRIOR TO DOSAGE. MAY USE TYLENOL( GENERIC IF NEEDED) PATIENT VERBALIZED UNDERSTANDING.

## 2017-04-20 NOTE — Telephone Encounter (Signed)
Returned the call back to the patient. He stated that he has been having headaches and the inability to sleep since starting the Imdur. He would like to know if he may take half a tablet (15 mg) instead of the 30 mg. Will route to the provider for his recommendation.

## 2017-04-21 NOTE — Telephone Encounter (Signed)
Agree with this plan.  Glenetta Hew, MD

## 2017-04-22 ENCOUNTER — Telehealth: Payer: Self-pay | Admitting: *Deleted

## 2017-04-22 ENCOUNTER — Other Ambulatory Visit: Payer: Self-pay | Admitting: *Deleted

## 2017-04-22 DIAGNOSIS — R7989 Other specified abnormal findings of blood chemistry: Secondary | ICD-10-CM

## 2017-04-22 DIAGNOSIS — Z01818 Encounter for other preprocedural examination: Secondary | ICD-10-CM

## 2017-04-22 MED ORDER — SODIUM CHLORIDE 0.9 % IV SOLN: INTRAVENOUS | Status: DC

## 2017-04-22 NOTE — Telephone Encounter (Signed)
Spoke to patient. Result given . Verbalized understanding Patient aware will received extra iv fluid day of procedure. He will try increase hydration until then. Notified cath lab of the change needed for hydration.

## 2017-04-28 ENCOUNTER — Encounter: Payer: Self-pay | Admitting: Family Medicine

## 2017-05-03 ENCOUNTER — Telehealth: Payer: Self-pay | Admitting: Cardiology

## 2017-05-03 ENCOUNTER — Telehealth: Payer: Self-pay

## 2017-05-03 NOTE — Telephone Encounter (Signed)
Patient calling, states that he would like to verify if he should take his regular medication before he goes for the cardiac catheterization  tomorrow morning. Please call.

## 2017-05-03 NOTE — Telephone Encounter (Signed)
Spoke to patient . Per Dr Candie Echevaria to continue with cardiac cath.  Will change day to 05/05/17  At 11:30 am patient to arrive  At 6:30 am for hydration.  Patient is in agreement with change and left message with instruction per patient request.

## 2017-05-03 NOTE — Telephone Encounter (Signed)
Spoke with Pt's wife (DPR) Patient contacted pre-catheterization at [facility] scheduled for:  05/04/2017 @ 1030 Verified arrival time and place:  NT @ 0530-gave addl direction Confirmed AM meds to be taken pre-cath with sip of water: Take ASA Confirmed patient has responsible person to drive home post procedure and observe patient for 24 hours: yes-wife Addl concerns:  Concerns about billing-gave number to [employer] in [employer]

## 2017-05-03 NOTE — Telephone Encounter (Signed)
Per cath letter ok to meds as ordered, he states that he had a cold last week and just has a little cough the rest is gone.

## 2017-05-04 ENCOUNTER — Telehealth: Payer: Self-pay

## 2017-05-04 NOTE — Telephone Encounter (Signed)
Received message from wife asking if we knew Pt has allergy to shellfish.  She has a friend that is an Therapist, sports who said we should treat him prior to his cath for iodine allergy because of the shellfish allergy.  Left message notifying that we are aware of allergy, and that the contrast material used for this procedure is not equivalent to a shellfish allergy.  Notified that no pre cath allergy treatment is necessary.  Notified to call Dr. Allison Quarry office if they have any further concerns.

## 2017-05-05 ENCOUNTER — Ambulatory Visit (HOSPITAL_COMMUNITY)
Admission: RE | Admit: 2017-05-05 | Discharge: 2017-05-06 | Disposition: A | Discharge status: Home / Self Care | Payer: 59 | Source: Ambulatory Visit | Attending: Cardiology | Admitting: Cardiology

## 2017-05-05 ENCOUNTER — Encounter (HOSPITAL_COMMUNITY)
Admission: RE | Disposition: A | Discharge status: Home / Self Care | Payer: Self-pay | Source: Ambulatory Visit | Attending: Cardiology

## 2017-05-05 ENCOUNTER — Encounter (HOSPITAL_COMMUNITY): Payer: Self-pay | Admitting: Cardiology

## 2017-05-05 DIAGNOSIS — Z955 Presence of coronary angioplasty implant and graft: Secondary | ICD-10-CM

## 2017-05-05 DIAGNOSIS — Z9861 Coronary angioplasty status: Secondary | ICD-10-CM

## 2017-05-05 DIAGNOSIS — I2510 Atherosclerotic heart disease of native coronary artery without angina pectoris: Secondary | ICD-10-CM

## 2017-05-05 DIAGNOSIS — Z7982 Long term (current) use of aspirin: Secondary | ICD-10-CM | POA: Diagnosis not present

## 2017-05-05 DIAGNOSIS — R931 Abnormal findings on diagnostic imaging of heart and coronary circulation: Secondary | ICD-10-CM | POA: Diagnosis present

## 2017-05-05 DIAGNOSIS — Z8679 Personal history of other diseases of the circulatory system: Secondary | ICD-10-CM | POA: Diagnosis present

## 2017-05-05 DIAGNOSIS — E785 Hyperlipidemia, unspecified: Secondary | ICD-10-CM | POA: Insufficient documentation

## 2017-05-05 DIAGNOSIS — Z79899 Other long term (current) drug therapy: Secondary | ICD-10-CM | POA: Insufficient documentation

## 2017-05-05 DIAGNOSIS — Z91013 Allergy to seafood: Secondary | ICD-10-CM | POA: Diagnosis not present

## 2017-05-05 DIAGNOSIS — I25119 Atherosclerotic heart disease of native coronary artery with unspecified angina pectoris: Secondary | ICD-10-CM

## 2017-05-05 DIAGNOSIS — I10 Essential (primary) hypertension: Secondary | ICD-10-CM | POA: Insufficient documentation

## 2017-05-05 DIAGNOSIS — R7303 Prediabetes: Secondary | ICD-10-CM | POA: Diagnosis not present

## 2017-05-05 DIAGNOSIS — N400 Enlarged prostate without lower urinary tract symptoms: Secondary | ICD-10-CM | POA: Insufficient documentation

## 2017-05-05 DIAGNOSIS — I209 Angina pectoris, unspecified: Secondary | ICD-10-CM | POA: Diagnosis present

## 2017-05-05 DIAGNOSIS — Z01818 Encounter for other preprocedural examination: Secondary | ICD-10-CM

## 2017-05-05 DIAGNOSIS — R9430 Abnormal result of cardiovascular function study, unspecified: Secondary | ICD-10-CM

## 2017-05-05 HISTORY — DX: Coronary angioplasty status: Z98.61

## 2017-05-05 HISTORY — PX: CORONARY STENT INTERVENTION: CATH118234

## 2017-05-05 HISTORY — PX: LEFT HEART CATH AND CORONARY ANGIOGRAPHY: CATH118249

## 2017-05-05 HISTORY — DX: Atherosclerotic heart disease of native coronary artery without angina pectoris: I25.10

## 2017-05-05 LAB — GLUCOSE, CAPILLARY: GLUCOSE-CAPILLARY: 94 mg/dL (ref 65–99)

## 2017-05-05 LAB — POCT ACTIVATED CLOTTING TIME
ACTIVATED CLOTTING TIME: 340 s
Activated Clotting Time: 346 seconds

## 2017-05-05 SURGERY — LEFT HEART CATH AND CORONARY ANGIOGRAPHY
Anesthesia: LOCAL

## 2017-05-05 MED ORDER — SODIUM CHLORIDE 0.9% FLUSH: 3.0000 mL | INTRAVENOUS | Status: DC | PRN

## 2017-05-05 MED ORDER — CLOPIDOGREL BISULFATE 75 MG PO TABS
75.0000 mg | ORAL_TABLET | Freq: Every day | ORAL | Status: DC
  Administered 2017-05-06: 75 mg via ORAL
  Filled 2017-05-05: qty 1

## 2017-05-05 MED ORDER — ASPIRIN EC 81 MG PO TBEC: 81.0000 mg | DELAYED_RELEASE_TABLET | Freq: Every day | ORAL | Status: DC

## 2017-05-05 MED ORDER — HEPARIN SODIUM (PORCINE) 1000 UNIT/ML IJ SOLN
INTRAMUSCULAR | Status: DC | PRN
  Administered 2017-05-05 (×2): 4000 [IU] via INTRAVENOUS

## 2017-05-05 MED ORDER — CLOPIDOGREL BISULFATE 300 MG PO TABS
ORAL_TABLET | ORAL | Status: DC | PRN
  Administered 2017-05-05: 600 mg via ORAL

## 2017-05-05 MED ORDER — ANGIOPLASTY BOOK
Freq: Once | Status: DC
  Filled 2017-05-05: qty 1

## 2017-05-05 MED ORDER — FENTANYL CITRATE (PF) 100 MCG/2ML IJ SOLN
INTRAMUSCULAR | Status: AC
  Filled 2017-05-05: qty 2

## 2017-05-05 MED ORDER — SODIUM CHLORIDE 0.9% FLUSH
3.0000 mL | Freq: Two times a day (BID) | INTRAVENOUS | Status: DC
  Administered 2017-05-05: 3 mL via INTRAVENOUS

## 2017-05-05 MED ORDER — NITROGLYCERIN 1 MG/10 ML FOR IR/CATH LAB
INTRA_ARTERIAL | Status: DC | PRN
  Administered 2017-05-05 (×2): 200 ug via INTRACORONARY

## 2017-05-05 MED ORDER — FAMOTIDINE IN NACL 20-0.9 MG/50ML-% IV SOLN
20.0000 mg | Freq: Once | INTRAVENOUS | Status: AC
  Administered 2017-05-05: 15:00:00 20 mg via INTRAVENOUS
  Filled 2017-05-05: qty 50

## 2017-05-05 MED ORDER — IOPAMIDOL (ISOVUE-370) INJECTION 76%
INTRAVENOUS | Status: AC
  Filled 2017-05-05: qty 100

## 2017-05-05 MED ORDER — HYDRALAZINE HCL 20 MG/ML IJ SOLN: 5.0000 mg | INTRAMUSCULAR | Status: AC | PRN

## 2017-05-05 MED ORDER — LIDOCAINE HCL (PF) 1 % IJ SOLN
INTRAMUSCULAR | Status: DC | PRN
Start: 2017-05-05 — End: 2017-05-05
  Administered 2017-05-05: 2 mL

## 2017-05-05 MED ORDER — ACETAMINOPHEN 325 MG PO TABS
650.0000 mg | ORAL_TABLET | ORAL | Status: DC | PRN
  Administered 2017-05-05: 650 mg via ORAL
  Filled 2017-05-05: qty 2

## 2017-05-05 MED ORDER — LABETALOL HCL 5 MG/ML IV SOLN: 10.0000 mg | INTRAVENOUS | Status: AC | PRN

## 2017-05-05 MED ORDER — HEPARIN SODIUM (PORCINE) 1000 UNIT/ML IJ SOLN
INTRAMUSCULAR | Status: AC
  Filled 2017-05-05: qty 1

## 2017-05-05 MED ORDER — FENTANYL CITRATE (PF) 100 MCG/2ML IJ SOLN
INTRAMUSCULAR | Status: DC | PRN
  Administered 2017-05-05 (×2): 25 ug via INTRAVENOUS

## 2017-05-05 MED ORDER — LIDOCAINE HCL (PF) 1 % IJ SOLN
INTRAMUSCULAR | Status: AC
  Filled 2017-05-05: qty 30

## 2017-05-05 MED ORDER — VERAPAMIL HCL 2.5 MG/ML IV SOLN
INTRAVENOUS | Status: DC | PRN
  Administered 2017-05-05: 10 mL via INTRA_ARTERIAL

## 2017-05-05 MED ORDER — VERAPAMIL HCL 2.5 MG/ML IV SOLN
INTRAVENOUS | Status: AC
  Filled 2017-05-05: qty 2

## 2017-05-05 MED ORDER — SODIUM CHLORIDE 0.9 % IV SOLN: INTRAVENOUS | Status: DC

## 2017-05-05 MED ORDER — HEPARIN (PORCINE) IN NACL 2-0.9 UNIT/ML-% IJ SOLN
INTRAMUSCULAR | Status: AC
  Filled 2017-05-05: qty 1000

## 2017-05-05 MED ORDER — ROSUVASTATIN CALCIUM 40 MG PO TABS
40.0000 mg | ORAL_TABLET | Freq: Every day | ORAL | Status: DC
  Filled 2017-05-05: qty 1

## 2017-05-05 MED ORDER — CLOPIDOGREL BISULFATE 300 MG PO TABS
ORAL_TABLET | ORAL | Status: AC
  Filled 2017-05-05: qty 1

## 2017-05-05 MED ORDER — NITROGLYCERIN 0.4 MG SL SUBL: 0.4000 mg | SUBLINGUAL_TABLET | SUBLINGUAL | Status: DC | PRN

## 2017-05-05 MED ORDER — SODIUM CHLORIDE 0.9 % IV SOLN
INTRAVENOUS | Status: AC
  Administered 2017-05-05: 08:00:00 via INTRAVENOUS

## 2017-05-05 MED ORDER — SODIUM CHLORIDE 0.9 % IV SOLN: 250.0000 mL | INTRAVENOUS | Status: DC | PRN

## 2017-05-05 MED ORDER — ONDANSETRON HCL 4 MG/2ML IJ SOLN: 4.0000 mg | Freq: Four times a day (QID) | INTRAMUSCULAR | Status: DC | PRN

## 2017-05-05 MED ORDER — IOPAMIDOL (ISOVUE-370) INJECTION 76%
INTRAVENOUS | Status: DC | PRN
  Administered 2017-05-05: 200 mL

## 2017-05-05 MED ORDER — SODIUM CHLORIDE 0.9 % IV SOLN
125.0000 mL/h | INTRAVENOUS | Status: AC
  Administered 2017-05-05 (×2): 125 mL/h via INTRAVENOUS

## 2017-05-05 MED ORDER — MIDAZOLAM HCL 2 MG/2ML IJ SOLN
INTRAMUSCULAR | Status: AC
  Filled 2017-05-05: qty 2

## 2017-05-05 MED ORDER — HEPARIN (PORCINE) IN NACL 2-0.9 UNIT/ML-% IJ SOLN
INTRAMUSCULAR | Status: AC | PRN
  Administered 2017-05-05: 1000 mL

## 2017-05-05 MED ORDER — MIDAZOLAM HCL 2 MG/2ML IJ SOLN
INTRAMUSCULAR | Status: DC | PRN
  Administered 2017-05-05: 1 mg via INTRAVENOUS
  Administered 2017-05-05: 2 mg via INTRAVENOUS

## 2017-05-05 MED ORDER — IRBESARTAN 150 MG PO TABS
150.0000 mg | ORAL_TABLET | Freq: Every day | ORAL | Status: DC
  Filled 2017-05-05: qty 1

## 2017-05-05 SURGICAL SUPPLY — 20 items
BALLN SAPPHIRE 2.5X12 (BALLOONS) ×2
BALLN SAPPHIRE ~~LOC~~ 3.0X12 (BALLOONS) ×2 IMPLANT
BALLOON SAPPHIRE 2.5X12 (BALLOONS) ×1 IMPLANT
CATH INFINITI 5FR ANG PIGTAIL (CATHETERS) ×2 IMPLANT
CATH OPTITORQUE TIG 4.0 5F (CATHETERS) ×2 IMPLANT
CATH VISTA GUIDE 6FR XBLAD3.5 (CATHETERS) ×2 IMPLANT
DEVICE RAD COMP TR BAND LRG (VASCULAR PRODUCTS) ×2 IMPLANT
GLIDESHEATH SLEND A-KIT 6F 22G (SHEATH) ×2 IMPLANT
GUIDEWIRE INQWIRE 1.5J.035X260 (WIRE) ×1 IMPLANT
INQWIRE 1.5J .035X260CM (WIRE) ×2
KIT ENCORE 26 ADVANTAGE (KITS) ×2 IMPLANT
KIT HEART LEFT (KITS) ×2 IMPLANT
PACK CARDIAC CATHETERIZATION (CUSTOM PROCEDURE TRAY) ×2 IMPLANT
STENT SYNERGY DES 2.75X16 (Permanent Stent) ×2 IMPLANT
STENT SYNERGY DES 3X12 (Permanent Stent) ×2 IMPLANT
SYR MEDRAD MARK V 150ML (SYRINGE) ×2 IMPLANT
TRANSDUCER W/STOPCOCK (MISCELLANEOUS) ×2 IMPLANT
TUBING CIL FLEX 10 FLL-RA (TUBING) ×2 IMPLANT
VALVE GUARDIAN II ~~LOC~~ HEMO (MISCELLANEOUS) ×2 IMPLANT
WIRE MARVEL STR TIP 190CM (WIRE) ×2 IMPLANT

## 2017-05-05 NOTE — Discharge Summary (Signed)
Discharge Summary    Patient ID: Thomas Spencer,  MRN: [id number], DOB/AGE: Apr 12, 1966 51 y.o.  Admit date: 05/05/2017 Discharge date: 05/06/2017   Primary Care Provider: Tammi Sou  Primary Cardiologist: Ellyn Hack  Discharge Diagnoses    Principal Problem:   Angina, class III ([place]) Active Problems:   Hyperlipidemia with target low density lipoprotein (LDL) cholesterol less than 70 mg/dL   Essential hypertension   Agatston coronary artery calcium score between 200 and 399   Abnormal cardiac CT angiography   CAD S/P percutaneous coronary angioplasty   Allergies Allergies  Allergen Reactions  . Ace Inhibitors Cough  . Shrimp [Shellfish Allergy] Other (See Comments)    Swelling around eyes/lips---can eat IF cooked WELL    Diagnostic Studies/Procedures    LHC: 05/05/17  Conclusion     LESION #1: Prox LAD to Mid LAD lesion, 80 %stenosed.  A STENT SYNERGY DES [id number] drug eluting stent was successfully placed. - Postdilated and tapered fashion to 3.1-2.9 mm  Post intervention, there is a 0% residual stenosis.  LESION #2: Ramus lesion, 85 %stenosed.  A STENT SYNERGY DES [id number] drug eluting stent was successfully placed. Postdilated to 3.3 mm  Post intervention, there is a 0% residual stenosis.  There is hyperdynamic left ventricular systolic function. The left ventricular ejection fraction is greater than 65% by visual estimate.  LV end diastolic pressure is normal.   Severe 2 vessel disease involving the mid LAD and proximal ramus intermedius treated with 2 DES stents. Otherwise mild to moderate disease elsewhere.  Hyperdynamic left ventricle with normal LVEDP.  Plan:  Overnight monitoring and post procedure unit with TR band removal per protocol. - With 2 vessel PCI, not appropriate for same-day discharge  Dual and platelet therapy for minimum one year with aspirin and Plavix  Increase simvastatin to 40 mm daily  Continue ARB, but no beta blocker  due to bradycardia  Likely discharge tomorrow.  Follow-up with me OR APP in roughly 2 weeks  _____________   History of Present Illness     Thomas Spencer is a 51 y.o. male with PMH of HL, HTN, prediabetes, and BPH. He was originally seen on 03/15/2017. He had noted shortness of breath and diaphoresis, climbing stair, but did not have any symptoms on his treadmill. He also noted some heart fluttering. We decided evaluated with CT coronary calcium scoring - this was read as abnormal and he therefore returned to discuss the results for possible progression to stress test versus catheterization.   Coronary calcium score of 343 suggesting 3 vessel coronary disease in the proximal vessels.  He returned on 04/18/17 for f/u & to discuss the results of his Coronary Calcium Score. He was still having exertional dyspnea & chest tightness.  He noted 3 specific examples of prolonged dyspnea & chest discomfort - 2 of which were while walking upstairs & the 3rd was when he was rushing around to get some odd jobs done.  He had not been doing his routine workout regimen at the gym since his symptoms began, but is still doing walking exercises - just not pushing it much. He denied any dyspnea or chest pain at rest.  No PND, orthopnea or edema. No palpitations, lightheadedness, dizziness, weakness or syncope/near syncope. Given these findings he was referred for outpatient cardiac catheterization.   Hospital Course     Underwent LHC with Dr. Ellyn Hack noted above with PCI/ DES to mLAD and ramus intermediate. EF noted at 65% on cath. Plan for  DAPT with ASA/Plavix for at least one year. Crestor was increased to 40mg  daily. He was continued on ARB, but no BB due to bradycardia. Post cath labs showed stable Cr 1.31 and Hgb 15.5. No complications noted post cath. Worked with cardiac rehab without any complaints.   He was seen by Dr. Burt Knack and determined stable for discharge home. Follow up in the office has been  arranged. Medications are listed below.   General: Well developed, well nourished, male appearing in no acute distress. Head: Normocephalic, atraumatic.  Neck: Supple without bruits, JVD. Lungs:  Resp regular and unlabored, CTA. Heart: RRR, S1, S2, no S3, S4, or murmur; no rub. Abdomen: Soft, non-tender, non-distended with normoactive bowel sounds. No hepatomegaly. No rebound/guarding. No obvious abdominal masses. Extremities: No clubbing, cyanosis, edema. Distal pedal pulses are 2+ bilaterally. R radial cath site stable.  Neuro: Alert and oriented X 3. Moves all extremities spontaneously. Psych: Normal affect.  _____________  Discharge Vitals Blood pressure 113/76, pulse 68, temperature 98.6 F (37 C), temperature source Oral, resp. rate (!) 23, height 5\' 6"  (1.676 m), weight 169 lb 12.1 oz (77 kg), SpO2 98 %.  Filed Weights   05/05/17 0635 05/06/17 0453  Weight: 170 lb (77.1 kg) 169 lb 12.1 oz (77 kg)    Labs & Radiologic Studies    CBC  Recent Labs  05/06/17 0413  WBC 5.1  HGB 15.5  HCT 44.5  MCV 88.8  PLT 625   Basic Metabolic Panel  Recent Labs  05/06/17 0413  NA 136  K 3.9  CL 106  CO2 23  GLUCOSE 121*  BUN 16  CREATININE 1.31*  CALCIUM 9.1   Liver Function Tests No results for input(s): AST, ALT, ALKPHOS, BILITOT, PROT, ALBUMIN in the last 72 hours. No results for input(s): LIPASE, AMYLASE in the last 72 hours. Cardiac Enzymes No results for input(s): CKTOTAL, CKMB, CKMBINDEX, TROPONINI in the last 72 hours. BNP Invalid input(s): POCBNP D-Dimer No results for input(s): DDIMER in the last 72 hours. Hemoglobin A1C No results for input(s): HGBA1C in the last 72 hours. Fasting Lipid Panel No results for input(s): CHOL, HDL, LDLCALC, TRIG, CHOLHDL, LDLDIRECT in the last 72 hours. Thyroid Function Tests No results for input(s): TSH, T4TOTAL, T3FREE, THYROIDAB in the last 72 hours.  Invalid input(s): FREET3 _____________  No results  found. Disposition   Pt is being discharged home today in good condition.  Follow-up Plans & Appointments    Follow-up Information    Leonie Man, MD Follow up on 05/20/2017.   Specialty:  Cardiology Why:  at 1:20pm for your follow up appt.  Contact information: [address] [place] [address] [phone]          Discharge Instructions    AMB Referral to Cardiac Rehabilitation - Phase II    Complete by:  As directed    Diagnosis:   Stable Angina Coronary Stents        Discharge Medications   Current Discharge Medication List    START taking these medications   Details  clopidogrel (PLAVIX) 75 MG tablet Take 1 tablet (75 mg total) by mouth daily with breakfast. Qty: 90 tablet, Refills: 1      CONTINUE these medications which have CHANGED   Details  rosuvastatin (CRESTOR) 20 MG tablet Take 2 tablets (40 mg total) by mouth daily. Qty: 90 tablet, Refills: 3      CONTINUE these medications which have NOT CHANGED   Details  aspirin EC  81 MG tablet Take 81 mg by mouth daily.    Multiple Vitamin (MULTIVITAMIN WITH MINERALS) TABS tablet Take 1 tablet by mouth daily.    nitroGLYCERIN (NITROSTAT) 0.4 MG SL tablet Place 1 tablet (0.4 mg total) under the tongue every 5 (five) minutes as needed for chest pain. Qty: 90 tablet, Refills: 3    sildenafil (VIAGRA) 100 MG tablet Take 1 tablet (100 mg total) by mouth daily as needed for erectile dysfunction. Qty: 6 tablet, Refills: 5    telmisartan (MICARDIS) 40 MG tablet TAKE 1 TABLET BY MOUTH  DAILY Qty: 90 tablet, Refills: 1      STOP taking these medications     ibuprofen (ADVIL,MOTRIN) 200 MG tablet      isosorbide mononitrate (IMDUR) 30 MG 24 hr tablet          Aspirin prescribed at discharge?  Yes High Intensity Statin Prescribed? (Lipitor 40-80mg  or Crestor 20-40mg ): Yes Beta Blocker Prescribed? No: bradycardia For EF <40%, was ACEI/ARB Prescribed? Yes ADP Receptor Inhibitor  Prescribed? (i.e. Plavix etc.-Includes Medically Managed Patients): Yes For EF <40%, Aldosterone Inhibitor Prescribed? No: EF ok Was EF assessed during THIS hospitalization? Yes Was Cardiac Rehab II ordered? (Included Medically managed Patients): Yes   Outstanding Labs/Studies   FLP/LFTs in 6 weeks if tolerating statin increase.   Duration of Discharge Encounter   Greater than 30 minutes including physician time.  Signed, Reino Bellis NP-C 05/06/2017, 8:44 AM   Patient seen, examined. Available data reviewed. Agree with findings, assessment, and plan as outlined by Reino Bellis, NP-C. exam reveals an alert, oriented male in no distress. Lungs are clear. JVP is normal. Heart is regular rate and rhythm with no murmur or gallop. Right radial site is clear. Abdomen is soft and nontender. There is no peripheral edema. The patient underwent uncomplicated 2 vessel PCI yesterday. We discussed the importance of medical therapy, adherence to dual antiplatelet therapy with aspirin and clopidogrel, and ongoing risk reduction measures. The patient will return to work Monday. We discussed post PCI restrictions.  Sherren Mocha, M.D. 05/06/2017 8:44 AM

## 2017-05-05 NOTE — Progress Notes (Signed)
Patient complained of heartburn after lunch. Stated he had slight heartburn since the cath and was alittle worse after he ate. Notified Dr. Ellyn Hack and pepcid 20 mg IV ordered for heartburn.

## 2017-05-05 NOTE — Interval H&P Note (Signed)
History and Physical Interval Note:  05/05/2017 9:56 AM  Thomas Spencer  has presented today for surgery, with the diagnosis of angina (class 3) with abnormal Coronary Calcium Score (suggesting 3 Vessel CAD).  The various methods of treatment have been discussed with the patient and family. After consideration of risks, benefits and other options for treatment, the patient has consented to  Procedure(s): LEFT HEART CATH AND CORONARY ANGIOGRAPHY (N/A) with possible Percutaneous Coronary Intervention as a surgical intervention .  The patient's history has been reviewed, patient examined, no change in status, stable for surgery.  I have reviewed the patient's chart and labs.  Questions were answered to the patient's satisfaction.   Cath Lab Visit (complete for each Cath Lab visit)  Clinical Evaluation Leading to the Procedure:   ACS: No.  Non-ACS:    Anginal Classification: CCS III  Anti-ischemic medical therapy: Minimal Therapy (1 class of medications)  Non-Invasive Test Results: Intermediate-risk stress test findings: cardiac mortality 1-3%/year  Prior CABG: No previous CABG    Glenetta Hew

## 2017-05-05 NOTE — H&P (View-Only) (Signed)
PCP: Tammi Sou, MD  Clinic Note: Chief Complaint  Patient presents with  . Follow-up    DOE & chest discomfort --> Coronary CTA results    HPI: Thomas Spencer is a 51 y.o. male who is being seen today for follow-up evaluation of shortness of breath and sweating at the request of McGowen, Adrian Blackwater, MD.  Frankey Botting was originally seen on 03/15/2017. He had noted shortness of breath and diaphoresis, climbing stair , but did not have any symptoms on his treadmill. He also noted some heart fluttering. We decided evaluated with CT coronary calcium scoring - this was read as abnormal and he therefore returned to discuss the results for possible progression to stress test versus catheterization.  Recent Hospitalizations: None  Studies Personally Reviewed - (if available, images/films reviewed: From Epic Chart or Care Everywhere)  Coronary calcium score of 343 suggesting 3 vessel coronary disease in the proximal vessels.  Interval History: Zimir returns today for f/u & to discuss the results of his Coronary Calcium Score.  He still notes having exertional dyspnea & chest tightness.  He notes 3 specific examples of prolonged dyspnea & chest discomfort - 2 of which were while walking upstairs & the 3rd was when he was rushing around to get some odd jobs done.  He has not been doing his routine workout regimen at the gym since his symptoms began, but is still doing walking exercises - just not pushing it much. He denies any dyspnea or chest pain at rest.  No PND, orthopnea or edema. No palpitations, lightheadedness, dizziness, weakness or syncope/near syncope. No TIA/amaurosis fugax symptoms. No claudication.  ROS: A comprehensive was performed. Review of Systems  Constitutional: Negative for malaise/fatigue.  HENT: Negative for congestion and nosebleeds.   Respiratory: Negative for cough, shortness of breath (only as noted in HPI.) and wheezing.   Gastrointestinal: Negative for  blood in stool and melena.  Genitourinary: Negative for hematuria.  Musculoskeletal: Negative for joint pain and myalgias.  Psychiatric/Behavioral: The patient is nervous/anxious (since these episodes started to happen).   All other systems reviewed and are negative.  I have reviewed and (if needed) personally updated the patient's problem list, medications, allergies, past medical and surgical history, social and family history.   Past Medical History:  Diagnosis Date  . Acute epiglottitis 01/2014  . BPH with obstruction/lower urinary tract symptoms    [facility] referral done 01/2011 (Dr. Risa Grill); ultimately a prostate biopsy was done for increased PSA velocity--April 2013--and it  was completely normal.  . Elevated liver function tests    mild  . Erectile dysfunction   . Family history of colon cancer    father  . Family history of hypertension    strong family history  . Family history of premature coronary heart disease    strong family history; Mother (MI in 12s, then 44s, s/p Valve Sgx in 80s) & oldest Brother (CABG x 4 in 27s)  . History of sciatica 11/2013   responded well to prednisone burst 11/2013  . Hx of colonic polyp 2007; 2014   Recall 07/2018 (Dr. Wynetta Emery at [facility])  . Hyperlipidemia    On 10 mg Crestor  . Hypertension    On telmisartan  . Prediabetes 11/2016   HbA1c 6.1%     Past Surgical History:  Procedure Laterality Date  . COLONOSCOPY W/ POLYPECTOMY  09/02/06; 07/2013   [facility]: adenomatous polyp w/out high grade dysplasia or malignancy.  Recall 07/2018  . coronary  calcium score     Very high risk: as of 03/2017 pt is to return to discuss with Dr. Ellyn Hack: stress test vs cath as next step.  Marland Kitchen dental implants    . PROSTATE BIOPSY  01/2012   Normal    Current Meds  Medication Sig  . aspirin 81 MG tablet Take 81 mg by mouth daily.    . Multiple Vitamin (MULTIVITAMIN) tablet Take 1 tablet by mouth daily.    . sildenafil (VIAGRA) 100 MG tablet Take  1 tablet (100 mg total) by mouth daily as needed for erectile dysfunction.  Marland Kitchen telmisartan (MICARDIS) 40 MG tablet TAKE 1 TABLET BY MOUTH  DAILY  . [DISCONTINUED] rosuvastatin (CRESTOR) 10 MG tablet TAKE 1 TABLET BY MOUTH AT  BEDTIME    Allergies  Allergen Reactions  . Ace Inhibitors Cough    Social History   Social History  . Marital status: Married    Spouse name: N/A  . Number of children: 2  . Years of education: N/A   Occupational History  . [employer] History Main Topics  . Smoking status: Never Smoker  . Smokeless tobacco: Never Used  . Alcohol use Yes     Comment: wine- socially  . Drug use: No  . Sexual activity: Yes    Partners: Female   Other Topics Concern  . None   Social History Narrative   SH: Married, two teenagers (1 boy and 1 girl) as of 10/2015.   He is a Personal assistant, lives in [place], [place].   He has a PhD in a vertical engineering any [employer] in intellectual property   No Tobacco, rare ETOH, no drug use.   Exercise occasionally - most days the week he goes to the gym.             family history includes Cancer in his father and sister; Coronary artery disease (age of onset: 68) in his brother; Diabetes in his sister and sister; Heart attack (age of onset: 55) in his mother; Heart disease (age of onset: 43) in his mother; Hyperlipidemia in his brother and mother; Hypertension in his brother, mother, sister, and sister; Valvular heart disease (age of onset: 86) in his mother.  Wt Readings from Last 3 Encounters:  04/16/17 163 lb 9.6 oz (74.2 kg)  03/15/17 167 lb 12.8 oz (76.1 kg)  12/08/16 168 lb 8 oz (76.4 kg)    PHYSICAL EXAM BP 134/85   Pulse 62   Ht 5\' 5"  (1.651 m)   Wt 163 lb 9.6 oz (74.2 kg)   BMI 27.22 kg/m  Physical Exam  Constitutional: He is oriented to person, place, and time. He appears well-developed and well-nourished. No distress.  HENT:  Head: Normocephalic and atraumatic.  Nose: Nose normal.    Mouth/Throat: Oropharynx is clear and moist. No oropharyngeal exudate.  Eyes: Pupils are equal, round, and reactive to light. Conjunctivae and EOM are normal. Right eye exhibits no discharge. Left eye exhibits no discharge. No scleral icterus.  Cardiovascular: Normal rate, regular rhythm, normal heart sounds and intact distal pulses.  Exam reveals no gallop and no friction rub.   No murmur heard. Non-displaced PMI. No HJR or JVD  Pulmonary/Chest: Effort normal and breath sounds normal. No respiratory distress. He has no wheezes. He has no rales. He exhibits no tenderness.  Abdominal: Soft. Bowel sounds are normal. He exhibits no distension. There is no tenderness.  Musculoskeletal: Normal range of  motion. He exhibits no edema or deformity.  Neurological: He is alert and oriented to person, place, and time. No cranial nerve deficit.  Skin: Skin is warm and dry. No rash noted. No erythema.  Psychiatric: He has a normal mood and affect. His behavior is normal. Judgment and thought content normal.  Nursing note and vitals reviewed.   Adult ECG Report n/a  Other studies Reviewed: Additional studies/ records that were reviewed today include:  Recent Labs:   Lab Results  Component Value Date   CREATININE 1.35 12/08/2016   BUN 17 12/08/2016   NA 136 12/08/2016   K 4.6 12/08/2016   CL 101 12/08/2016   CO2 31 12/08/2016    ASSESSMENT / PLAN: Problem List Items Addressed This Visit    [place]atston coronary artery calcium score between 200 and 399    High coronary calcium score in a patient with symptoms. The calcium score suggests three-vessel disease. Basilar fact that he still has his concerning symptoms for possible angina, I think the best course of action is to proceed with cardiac catheterization.      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 He has a significant family history with his brother just recently having bypass surgery. He himself has hypertension and dyslipidemia.  Plan: Left Heart Catheterization with Coronary Angiography and Possible Percutaneous Coronary Intervention. He has plans to be out of town the end of next week for his son's completion of [employer] @ [employer] [place]. -- Will schedule cath for the following Tuesday (after I return from vacation) -- he stands that he should limit his activity and we will prescribe nitroglycerin glycerin as well as Imdur.      Relevant Medications   isosorbide mononitrate (IMDUR) 30 MG 24 hr tablet   nitroGLYCERIN (NITROSTAT) 0.4 MG SL tablet   rosuvastatin (CRESTOR) 20 MG tablet   Other Relevant Orders   LEFT HEART CATHETERIZATION WITH CORONARY ANGIOGRAM   Dyspnea on exertion    Associated with diaphoresis and some chest discomfort now. In the setting of abnormal calcium score, plan is to proceed with cardiac catheterization to exclude ischemic CAD.      Relevant Orders   Basic metabolic panel   CBC   LEFT HEART CATHETERIZATION WITH CORONARY ANGIOGRAM   Essential hypertension (Chronic)    Well-controlled today on ARB. Depending on what we see in the Cath Lab, may need to consider beta blocker.      Relevant Medications   isosorbide mononitrate (IMDUR) 30 MG 24 hr tablet   nitroGLYCERIN (NITROSTAT) 0.4 MG SL tablet   rosuvastatin (CRESTOR) 20 MG tablet   Family history of premature coronary heart disease   Relevant Orders   LEFT HEART CATHETERIZATION WITH CORONARY ANGIOGRAM   Fluttering sensation of heart    Continue to monitor these symptoms. I don't think they're occurring at a frequency  to capture. He is not had any more episodes since I last saw him. He will see the follow on his Smart watch.      Hyperlipidemia with target low density lipoprotein (LDL) cholesterol less than 70  mg/dL (Chronic)    He didn't do well with atorvastatin or simvastatin. He is now on Crestor 20 mg daily. Tolerating that well. That followed by PCP. Target now is more vigorous given his cardiac calcium score and symptoms.      Relevant Medications   isosorbide mononitrate (IMDUR) 30 MG 24 hr tablet   nitroGLYCERIN (NITROSTAT) 0.4 MG SL tablet   rosuvastatin (CRESTOR) 20 MG tablet    Other Visit Diagnoses    Pre-op testing       Relevant Orders   Basic metabolic panel   Protime-INR   CBC   Clotting disorder [facility])       Relevant Orders   Protime-INR     Performing MD:  Glenetta Hew, MD, M.S.  Procedure:  LEFT HEART CATHETERIZATION WITH CORONARY ANGIOGRAPHY & POSSIBLE PERCUTANEOUS CORONARY INTERVENTION.  The procedure with Risks/Benefits/Alternatives and Indications was reviewed with the patient & his wife.  All questions were answered.    Risks / Complications include, but not limited to: Death, MI, CVA/TIA, VF/VT (with defibrillation), Bradycardia (need for temporary pacer placement), contrast induced nephropathy, bleeding / bruising / hematoma / pseudoaneurysm, vascular or coronary injury (with possible emergent CT or Vascular Surgery), adverse medication reactions, infection.  Additional risks involving the use of radiation with the possibility of radiation burns and cancer were explained in detail.  The patient and wife voice understanding and agree to proceed.     Current medicines are reviewed at length with the patient today. (+/- concerns) n/a The following changes have been made: n/a  Patient Instructions    [facility] [address] [place] [address] Dept: [phone] Loc:  [phone]  Hudson Majkowski  04/16/2017  You are scheduled for a Cardiac Catheterization on Tuesday, August 21 with Dr. Glenetta Hew.  1. Please arrive at the [facility] (Main Entrance A) at [facility]: [address] [place], [place] [address] at 5:30 AM (two hours before your procedure to ensure your preparation). Free valet parking service is available.   Special note: Every effort is made to have your procedure done on time. Please understand that emergencies sometimes delay scheduled procedures.  2. Diet: Do not eat or drink anything after midnight prior to your procedure except sips of water to take medications.  3. Labs: You will need to have blood drawn on Wednesday, August 8 at [facility] [address], [place]  Open: 8am - 4:30pm (Lunch 12:30 - 1:30). You do not need to be fasting.  4. Medication instructions in preparation for your procedure: N/A   On the morning of your procedure, take your Aspirin 81 MG and any morning medicines NOT listed above.  You may use sips of water.  5. Plan for one night stay--bring personal belongings. 6. Bring a current list of your medications and current insurance cards. 7. You MUST have a responsible person to drive you home. 8. Someone MUST be with you the first 24 hours after you arrive home or your discharge will be delayed. 9. Please wear clothes that are easy to get on and off and wear slip-on shoes.  Thank you for allowing [place] to care for you!   -- [facility]  Medication addition and instructions  START ISOSORBIDE MN 30 MG -- ONE TABLET DAILY   MAY USE NITROGLYCERIN SUBLINGUAL TABLETS IF NEEDED   DO NOT USE VIAGRA AT ALL UNTIL AFTER PROCEURES AND  DOCTOR GIVE THE OKAY,  INCREASE CRESTOR( GENERIC) 20 MG DAILY    Your physician recommends that you schedule a follow-up appointment in [place].      Nitroglycerin sublingual  tablets What is this medicine? NITROGLYCERIN (nye troe GLI ser in) is a type of vasodilator. It relaxes blood vessels, increasing the blood and oxygen supply to your heart. This medicine is used to relieve chest pain caused by angina. It is also used to prevent chest pain before activities like climbing stairs, going outdoors in cold weather, or sexual activity. This medicine may be used for other purposes; ask your health care provider or pharmacist if you have questions. COMMON BRAND NAME(S): Nitroquick, Nitrostat, Nitrotab What should I tell my health care provider before I take this medicine? They need to know if you have any of these conditions: -anemia -head injury, recent stroke, or bleeding in the brain -liver disease -previous heart attack -an unusual or allergic reaction to nitroglycerin, other medicines, foods, dyes, or preservatives -pregnant or trying to get pregnant -breast-feeding How should I use this medicine? Take this medicine by mouth as needed. At the first sign of an angina attack (chest pain or tightness) place one tablet under your tongue. You can also take this medicine 5 to 10 minutes before an event likely to produce chest pain. Follow the directions on the prescription label. Let the tablet dissolve under the tongue. Do not swallow whole. Replace the dose if you accidentally swallow it. It will help if your mouth is not dry. Saliva around the tablet will help it to dissolve more quickly. Do not eat or drink, smoke or chew tobacco while a tablet is dissolving. If you are not better within 5 minutes after taking ONE dose of nitroglycerin, call 9-1-1 immediately to seek emergency medical care. Do not take more than 3 nitroglycerin tablets over 15 minutes. If you take this medicine often to relieve symptoms of angina, your doctor or health care professional may provide you with different instructions to manage your symptoms. If symptoms do not go away after following these  instructions, it is important to call 9-1-1 immediately. Do not take more than 3 nitroglycerin tablets over 15 minutes. Talk to your pediatrician regarding the use of this medicine in children. Special care may be needed. Overdosage: If you think you have taken too much of this medicine contact a poison control center or emergency room at once. NOTE: This medicine is only for you. Do not share this medicine with others. What if I miss a dose? This does not apply. This medicine is only used as needed. What may interact with this medicine? Do not take this medicine with any of the following medications: -certain migraine medicines like ergotamine and dihydroergotamine (DHE) -medicines used to treat erectile dysfunction like sildenafil, tadalafil, and vardenafil -riociguat This medicine may also interact with the following medications: -alteplase -aspirin -heparin -medicines for high blood pressure -medicines for mental depression -other medicines used to treat angina -phenothiazines like chlorpromazine, mesoridazine, prochlorperazine, thioridazine This list may not describe all possible interactions. Give your health care provider a list of all the medicines, herbs, non-prescription drugs, or dietary supplements you use. Also tell them if you smoke, drink alcohol, or use illegal drugs. Some items may interact with your medicine. What should I watch for while using this medicine? Tell your doctor or health care professional if you feel your medicine is no longer working. Keep this medicine with you at  all times. Sit or lie down when you take your medicine to prevent falling if you feel dizzy or faint after using it. Try to remain calm. This will help you to feel better faster. If you feel dizzy, take several deep breaths and lie down with your feet propped up, or bend forward with your head resting between your knees. You may get drowsy or dizzy. Do not drive, use machinery, or do anything that  needs mental alertness until you know how this drug affects you. Do not stand or sit up quickly, especially if you are an older patient. This reduces the risk of dizzy or fainting spells. Alcohol can make you more drowsy and dizzy. Avoid alcoholic drinks. Do not treat yourself for coughs, colds, or pain while you are taking this medicine without asking your doctor or health care professional for advice. Some ingredients may increase your blood pressure. What side effects may I notice from receiving this medicine? Side effects that you should report to your doctor or health care professional as soon as possible: -blurred vision -dry mouth -skin rash -sweating -the feeling of extreme pressure in the head -unusually weak or tired Side effects that usually do not require medical attention (report to your doctor or health care professional if they continue or are bothersome): -flushing of the face or neck -headache -irregular heartbeat, palpitations -nausea, vomiting This list may not describe all possible side effects. Call your doctor for medical advice about side effects. You may report side effects to FDA at 1-800-FDA-1088. Where should I keep my medicine? Keep out of the reach of children. Store at room temperature between 20 and 25 degrees C (68 and 77 degrees F). Store in [employer] of [employer]. Protect from light and moisture. Keep tightly closed. Throw away any unused medicine after the expiration date. NOTE: This sheet is a summary. It may not cover all possible information. If you have questions about this medicine, talk to your doctor, pharmacist, or health care provider.  2018 Elsevier/Gold Standard (2013-06-29 17:57:36)     Studies Ordered:   Orders Placed This Encounter  Procedures  . Basic metabolic panel  . Protime-INR  . CBC  . LEFT HEART CATHETERIZATION WITH CORONARY Illene Silver, M.D., M.S. Interventional Cardiologist   Pager # [phone] Phone #  [phone] [address] [place], [place] [address]

## 2017-05-06 DIAGNOSIS — N400 Enlarged prostate without lower urinary tract symptoms: Secondary | ICD-10-CM | POA: Diagnosis not present

## 2017-05-06 DIAGNOSIS — E785 Hyperlipidemia, unspecified: Secondary | ICD-10-CM | POA: Diagnosis not present

## 2017-05-06 DIAGNOSIS — I10 Essential (primary) hypertension: Secondary | ICD-10-CM | POA: Diagnosis not present

## 2017-05-06 DIAGNOSIS — I209 Angina pectoris, unspecified: Secondary | ICD-10-CM

## 2017-05-06 DIAGNOSIS — I25119 Atherosclerotic heart disease of native coronary artery with unspecified angina pectoris: Secondary | ICD-10-CM | POA: Diagnosis not present

## 2017-05-06 LAB — CBC
HCT: 44.5 % (ref 39.0–52.0)
HEMOGLOBIN: 15.5 g/dL (ref 13.0–17.0)
MCH: 30.9 pg (ref 26.0–34.0)
MCHC: 34.8 g/dL (ref 30.0–36.0)
MCV: 88.8 fL (ref 78.0–100.0)
PLATELETS: 187 10*3/uL (ref 150–400)
RBC: 5.01 MIL/uL (ref 4.22–5.81)
RDW: 12.2 % (ref 11.5–15.5)
WBC: 5.1 10*3/uL (ref 4.0–10.5)

## 2017-05-06 LAB — BASIC METABOLIC PANEL
ANION GAP: 7 (ref 5–15)
BUN: 16 mg/dL (ref 6–20)
CALCIUM: 9.1 mg/dL (ref 8.9–10.3)
CHLORIDE: 106 mmol/L (ref 101–111)
CO2: 23 mmol/L (ref 22–32)
Creatinine, Ser: 1.31 mg/dL — ABNORMAL HIGH (ref 0.61–1.24)
GFR calc non Af Amer: >60 mL/min (ref >60)
Glucose, Bld: 121 mg/dL — ABNORMAL HIGH (ref 65–99)
POTASSIUM: 3.9 mmol/L (ref 3.5–5.1)
Sodium: 136 mmol/L (ref 135–145)

## 2017-05-06 MED ORDER — ROSUVASTATIN CALCIUM 20 MG PO TABS: 40.0000 mg | ORAL_TABLET | Freq: Every day | ORAL | 3 refills | Status: DC

## 2017-05-06 MED ORDER — CLOPIDOGREL BISULFATE 75 MG PO TABS: 75.0000 mg | ORAL_TABLET | Freq: Every day | ORAL | 1 refills | Status: DC

## 2017-05-06 NOTE — Progress Notes (Signed)
CARDIAC REHAB PHASE I   PRE:  Rate/Rhythm: up in hall  BP:  Supine:   Sitting: 113/76  Standing:    SaO2:   MODE:  Ambulation: 1000 plus  ft   POST:  Rate/Rhythm: 99 SR  BP:  Supine:   Sitting: 139/98  Standing:    SaO2:  0810-0902 Pt up in hall independently without CP. Tolerated well. Education completed with pt and wife who voiced understanding. Stressed importance of plavix with stent. Reviewed NTG use, risk factors, heart healthy food choices and ex ed. Discussed CRP 2 and will refer to [place]. Pt is very active and knows not to return to gym until seen by cardiology.   Graylon Good, RN BSN  05/06/2017 8:55 AM

## 2017-05-06 NOTE — Care Management Note (Addendum)
Case Management Note  Patient Details  Name: Thomas Spencer MRN: [id number] Date of Birth: [birth year]  Subjective/Objective:  From home with wife,  pta indep,s/p coronary stent intervention, will be on plavix.                   Action/Plan: NCM will follow for dc needs.   Expected Discharge Date:                  Expected Discharge Plan:  Home/Self Care  In-House Referral:     Discharge planning Services  CM Consult  Post Acute Care Choice:    Choice offered to:     DME Arranged:    DME Agency:     HH Arranged:    [place] Agency:     Status of Service:  Completed, signed off  If discussed at [employer] of Stay Meetings, dates discussed:    Additional Comments:  Zenon Mayo, RN 05/06/2017, 8:53 AM

## 2017-05-13 ENCOUNTER — Telehealth (HOSPITAL_COMMUNITY): Payer: Self-pay

## 2017-05-13 NOTE — Telephone Encounter (Signed)
Patient insurance is active and benefits verified. Patient insurance is [employer] - $20.00 co-payment, deductible $800/$84.26 has been met, out of pocket $2300/$214.26 has been met, no co-insurance and no pre-authorization. Passport/reference [id number].

## 2017-05-19 ENCOUNTER — Encounter: Payer: Self-pay | Admitting: Cardiology

## 2017-05-20 ENCOUNTER — Ambulatory Visit (INDEPENDENT_AMBULATORY_CARE_PROVIDER_SITE_OTHER): Payer: 59 | Admitting: Cardiology

## 2017-05-20 ENCOUNTER — Encounter: Payer: Self-pay | Admitting: Cardiology

## 2017-05-20 VITALS — BP 126/80 | HR 67 | Ht 67.0 in | Wt 162.0 lb

## 2017-05-20 DIAGNOSIS — Z9861 Coronary angioplasty status: Secondary | ICD-10-CM | POA: Diagnosis not present

## 2017-05-20 DIAGNOSIS — R931 Abnormal findings on diagnostic imaging of heart and coronary circulation: Secondary | ICD-10-CM | POA: Diagnosis not present

## 2017-05-20 DIAGNOSIS — E785 Hyperlipidemia, unspecified: Secondary | ICD-10-CM | POA: Diagnosis not present

## 2017-05-20 DIAGNOSIS — I209 Angina pectoris, unspecified: Secondary | ICD-10-CM | POA: Diagnosis not present

## 2017-05-20 DIAGNOSIS — I2510 Atherosclerotic heart disease of native coronary artery without angina pectoris: Secondary | ICD-10-CM

## 2017-05-20 DIAGNOSIS — I10 Essential (primary) hypertension: Secondary | ICD-10-CM

## 2017-05-20 DIAGNOSIS — R0609 Other forms of dyspnea: Secondary | ICD-10-CM

## 2017-05-20 NOTE — Progress Notes (Signed)
PCP: Tammi Sou, MD  Clinic Note: Chief Complaint  Patient presents with  . Follow-up    followup from cath  . Coronary Artery Disease    PCI to LAD and ramus intermedius    HPI: Thomas Spencer is a 51 y.o. male who is being seen today for Hospital-/PCI follow-up with a new diagnosis of CAD-PCI.  Jatin was initially seen in consultation for exertional dyspnea and occasional chest discomfort at the request of McGowen, Adrian Blackwater, MD back in early July. He was noticing dyspnea climbing of stairs and heart fluttering. We evaluated within the coronary calcium score that was read as abnormal and suggested 3 vessel disease.  Thomas Spencer was last seen on August 3 in follow-up from his coronary calcium score --he was also noticing some exertional chest discomfort as well. We therefore decided to take him to the cardiac catheterization lab.  Recent Hospitalizations: Overnight observation following cardiac catheterization and PCI.  Studies Personally Reviewed - (if available, images/films reviewed: From Epic Chart or Care Everywhere)  Cath-PCI 05/05/17:   LESION #1: Prox LAD to Mid LAD lesion, 80 %stenosed.  A STENT SYNERGY DES 2.75X16 was successfully placed. - Postdilated and tapered fashion to 3.1-2.9 mm  Post intervention, there is a 0% residual stenosis.  LESION #2: Ramus lesion, 85 %stenosed.  A STENT SYNERGY DES 3X12 was successfully placed. Postdilated to 3.3 mm  Post intervention, there is a 0% residual stenosis.  There is hyperdynamic left ventricular systolic function. EF > 65% by visual estimate.  LV end diastolic pressure is normal.  Diagnostic Diagram       Post-Intervention Diagram                        Interval History: Thomas Spencer returns today overall doing quite well. He is gradually getting back into his routine. He still says he is a little bit tired or short of breath when exerting himself, but is not noticing any chest discomfort. He is pleasantly  surprised with how simple cold procedure was. He is ready to get back into full exercise he denies any melena, hematochezia, hematuria, epistaxis or easy bruising on aspirin and Plavix. No PND, orthopnea or edema. No palpitations, lightheadedness, dizziness, weakness or syncope/near syncope. No TIA/amaurosis fugax symptoms.  No claudication.  ROS: A comprehensive was performed. Review of Systems  Constitutional: Negative for malaise/fatigue (Gradually building self backup for activity).  HENT: Negative for congestion and nosebleeds.   Respiratory: Negative for cough, shortness of breath and wheezing.   Gastrointestinal: Negative for blood in stool and melena.  Genitourinary: Negative for hematuria.  Musculoskeletal: Negative for joint pain and myalgias.  Neurological: Negative for dizziness.  Endo/Heme/Allergies: Does not bruise/bleed easily.  All other systems reviewed and are negative.   I have reviewed and (if needed) personally updated the patient's problem list, medications, allergies, past medical and surgical history, social and family history.   Past Medical History:  Diagnosis Date  . Acute epiglottitis 01/2014  . BPH with obstruction/lower urinary tract symptoms    [facility] referral done 01/[facility] (Dr. Risa Grill); ultimately a prostate biopsy was done for increased PSA velocity--April 2013--and it  was completely normal.  . CAD S/P percutaneous coronary angioplasty 05/05/2017   LEFT HEART CATH AND CORONARY ANGIOGRAPHY -- CORONARY STENT INTERVENTION Conclusion  LESION #1: Prox LAD to Mid LAD lesion, 80 %stenosed -->  A STENT SYNERGY DES 2.75X16 drug eluting stent was successfully placed. - Postdilated and tapered fashion to  3.1-2.9 mm LESION #2: Ramus lesion, 85 %stenosed. A STENT SYNERGY DES 3X12 drug eluting stent was successfully placed. Postdilated to 3.3 mm There is hyperdynamic left ventricular systolic function. The left ventricular ejection fraction is greater than 65% by  visual estimate.    . Elevated coronary artery calcium score 03/2017   indicating possible 3 vessel CAD: cardiac cath per Dr. Ellyn Hack 8/22 revealed 2 Vessel obstructive CAD in LAD & RI   . Elevated liver function tests    mild  . Erectile dysfunction   . Family history of colon cancer    father  . Family history of hypertension    strong family history  . Family history of premature coronary heart disease    strong family history; Mother (MI in 43s, then 65s, s/p Valve Sgx in 63s) & oldest Brother (CABG x 4 in 68s)  . History of sciatica 11/2013   responded well to prednisone burst 11/2013  . Hx of colonic polyp 2007; 2014   Recall 07/2018 (Dr. Wynetta Emery at [place])  . Hyperlipidemia    On 10 mg Crestor  . Hypertension    On telmisartan  . Prediabetes 11/2016   HbA1c 6.1%     Past Surgical History:  Procedure Laterality Date  . COLONOSCOPY W/ POLYPECTOMY  09/02/06; 07/2013   [facility]: adenomatous polyp w/out high grade dysplasia or malignancy.  Recall 07/2018  . coronary calcium score     Very high risk: as of 03/2017 pt is to return to discuss with Dr. Ellyn Hack: stress test vs cath as next step.  . CORONARY STENT INTERVENTION N/A 05/05/2017   Procedure: CORONARY STENT INTERVENTION;  Surgeon: Leonie Man, MD;  Location: [facility];  Service: Cardiovascular;: CAD S/P PCI: p-mLAD Synergy DES 2.75 x 16 (3.1-2.9 mm), p-mRI Synergy DES 3.0 x 12 (3.3 mm)  . dental implants    . LEFT HEART CATH AND CORONARY ANGIOGRAPHY N/A 05/05/2017   Procedure: LEFT HEART CATH AND CORONARY ANGIOGRAPHY;  Surgeon: Leonie Man, MD;  Location: [facility];  Service: Cardiovascular;  Laterality: N/A; p-m LAD 80%, mRI 90% --> 2 vessel PCI  . PROSTATE BIOPSY  01/2012   Normal    Current Meds  Medication Sig  . aspirin EC 81 MG tablet Take 81 mg by mouth daily.  . clopidogrel (PLAVIX) 75 MG tablet Take 1 tablet (75 mg total) by mouth daily with breakfast.  . Multiple Vitamin  (MULTIVITAMIN WITH MINERALS) TABS tablet Take 1 tablet by mouth daily.  . nitroGLYCERIN (NITROSTAT) 0.4 MG SL tablet Place 1 tablet (0.4 mg total) under the tongue every 5 (five) minutes as needed for chest pain.  . rosuvastatin (CRESTOR) 20 MG tablet Take 2 tablets (40 mg total) by mouth daily.  . sildenafil (VIAGRA) 100 MG tablet Take 1 tablet (100 mg total) by mouth daily as needed for erectile dysfunction.  Marland Kitchen telmisartan (MICARDIS) 40 MG tablet TAKE 1 TABLET BY MOUTH  DAILY    Allergies  Allergen Reactions  . Ace Inhibitors Cough  . Shrimp [Shellfish Allergy] Other (See Comments)    Swelling around eyes/lips---can eat IF cooked WELL    Social History   Social History  . Marital status: Married    Spouse name: N/A  . Number of children: 2  . Years of education: N/A   Occupational History  . [employer] History Main Topics  . Smoking status: Never Smoker  .  Smokeless tobacco: Never Used  . Alcohol use 1.2 oz/week    2 Glasses of wine per week  . Drug use: No  . Sexual activity: Yes    Partners: Female   Other Topics Concern  . None   Social History Narrative   SH: Married, two teenagers (1 boy and 1 girl) as of 10/2015.   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   Exercise occasionally - most days the week he goes to the gym.             family history includes Cancer in his father and sister; Coronary artery disease (age of onset: 56) in his brother; Diabetes in his sister and sister; Heart attack (age of onset: 32) in his mother; Heart disease (age of onset: 64) in his mother; Hyperlipidemia in his brother and mother; Hypertension in his brother, mother, sister, and sister; Valvular heart disease (age of onset: 29) in his mother.  Wt Readings from Last 3 Encounters:  05/20/17 162 lb (73.5 kg)  05/06/17 169 lb 12.1 oz  (77 kg)  04/16/17 163 lb 9.6 oz (74.2 kg)    PHYSICAL EXAM BP 126/80   Pulse 67   Ht 5\' 7"  (1.702 m)   Wt 162 lb (73.5 kg)   BMI 25.37 kg/m  Physical Exam  Constitutional: He is oriented to person, place, and time. He appears well-developed and well-nourished. No distress.  Well-groomed.  HENT:  Head: Normocephalic and atraumatic.  Neck: Trachea normal and normal range of motion. Neck supple. No hepatojugular reflux and no JVD present. Carotid bruit is not present. No tracheal deviation present. No thyromegaly present.  Cardiovascular: Normal rate, regular rhythm, normal heart sounds and intact distal pulses.   Occasional extrasystoles are present. PMI is not displaced.  Exam reveals no gallop and no friction rub.   No murmur heard. Pulmonary/Chest: Breath sounds normal. No respiratory distress. He has no wheezes. He has no rales.  Abdominal: Soft. Bowel sounds are normal. He exhibits no distension. There is no tenderness. There is no rebound.  Musculoskeletal: Normal range of motion. He exhibits no edema.  Right radial cath site clean dry and intact. Cannot even see the access site  Neurological: He is alert and oriented to person, place, and time.  Skin: Skin is warm and dry.  Psychiatric: He has a normal mood and affect. His behavior is normal. Judgment and thought content normal.  Nursing note and vitals reviewed.    Adult ECG Report  Rate: 67 ;  Rhythm: normal sinus rhythm and Early repolarization changes. Otherwise normal axis, intervals and durations;   Narrative Interpretation: Stable EKG   Other studies Reviewed: Additional studies/ records that were reviewed today include:  Recent Labs:   Lab Results  Component Value Date   CHOL 147 12/08/2016   HDL 29.80 (L) 12/08/2016   LDLCALC 97 10/23/2015   LDLDIRECT 72.0 12/08/2016   TRIG 220.0 (H) 12/08/2016   CHOLHDL 5 12/08/2016   Lab Results  Component Value Date   CREATININE 1.31 (H) 05/06/2017   BUN 16 05/06/2017    NA 136 05/06/2017   K 3.9 05/06/2017   CL 106 05/06/2017   CO2 23 05/06/2017   Lab Results  Component Value Date   WBC 5.1 05/06/2017   HGB 15.5 05/06/2017   HCT 44.5 05/06/2017  MCV 88.8 05/06/2017   PLT 187 05/06/2017   ASSESSMENT / PLAN: Problem List Items Addressed This Visit    Agatston coronary artery calcium score between 200 and 399    High coronary calcium score correctly predicted significant CAD. He does a calcification in the RCA, but no obstruction.Continue risk factor modification      Angina, class III (HCC) (Chronic)    Notable improvement in symptoms following PCI. Line he is not requiring any nitroglycerin and never did fill the Imdur prescription.  For now I think is fine without Imdur      CAD S/P PCI: p-mLAD Synergy DES 2.75 x 16 (3.1-2.9 mm), p-mRI Synergy DES 3.0 x 12 (3.3 mm) (Chronic)    Somewhat surprisingly, he had significant lesions in the LAD and ramus intermedius treated with a DES stent in both. Hopefully he will be to start getting back to full activity now. I have a suspicion that some of his reluctance to do things is that he is fearful of the new diagnosis. I tried to reassure him that with revascularization, he should be better off than he was before. He is not on beta blocker because of resting bradycardia in the past. In with him having some exercise intolerance of symptoms now, would not start a beta blocker this point. He is on ARB and statin Continue aspirin and Plavix. My recommendation will be to continue aspirin and Plavix for a year and then stop aspirin      Relevant Orders   EKG 12-Lead (Completed)   Dyspnea on exertion    Not convinced that he was having before, but he is still is noticing a little exertional dyspnea.  I encouraged him to gradually build back up to his previous exercise level. I think he may have been cutting back his activity for a longer period of time than he thought is now somewhat deconditioned.       Essential hypertension - Primary (Chronic)    Well-controlled on low-dose ARB. Hold off on beta blocker for now      Relevant Orders   EKG 12-Lead (Completed)   Hyperlipidemia with target low density lipoprotein (LDL) cholesterol less than 70 mg/dL (Chronic)    Seems to be tolerating Crestor. He has not had an LDL check in a while now. His PCP is following with and the goal now should be LDL less than 70.         Current medicines are reviewed at length with the patient today. (+/- concerns) None The following changes have been made: None  Patient Instructions  Medication Instructions:  Your physician recommends that you continue on your current medications as directed. Please refer to the Current Medication list given to you today.  Labwork: None   Testing/Procedures: None   Follow-Up: Your physician wants you to follow-up in: Jan./Freb 2019 with Dr Ellyn Hack. You will receive a reminder letter in the mail two months in advance. If you don't receive a letter, please call our office to schedule the follow-up appointment.  Any Other Special Instructions Will Be Listed Below (If Applicable).     If you need a refill on your cardiac medications before your next appointment, please call your pharmacy.    Studies Ordered:   Orders Placed This Encounter  Procedures  . EKG 12-Lead      Glenetta Hew, M.D., M.S. Interventional Cardiologist   Pager # [phone] Phone # [phone] [address] [place], [place] [address]

## 2017-05-20 NOTE — Patient Instructions (Signed)
Medication Instructions:  Your physician recommends that you continue on your current medications as directed. Please refer to the Current Medication list given to you today.  Labwork: None   Testing/Procedures: None   Follow-Up: Your physician wants you to follow-up in: Jan./Freb 2019 with Dr Ellyn Hack. You will receive a reminder letter in the mail two months in advance. If you don't receive a letter, please call our office to schedule the follow-up appointment.  Any Other Special Instructions Will Be Listed Below (If Applicable).     If you need a refill on your cardiac medications before your next appointment, please call your pharmacy.

## 2017-05-20 NOTE — Progress Notes (Deleted)
Cardiology Office Note    Date:  05/20/2017   ID:  Thomas Spencer, DOB 1966/05/05, MRN [id number]  PCP:  Thomas Sou, MD  Cardiologist:  Dr. Ellyn Spencer  No chief complaint on file.   History of Present Illness:  Thomas Spencer is a 51 y.o. male with PMH of Erectile dysfunction, hypertension, hyperlipidemia, prediabetes, and recently diagnosed CAD. He was seen initially referred to cardiology service for shortness breath and sweating. The symptom did not occur with exertion. A coronary calcium score came back 343 suggesting 3 vessel coronary disease. He was seen by Dr. Ellyn Spencer on 04/16/2017, there is option was discussed. He eventually underwent cardiac catheterization on 05/05/2017 that showed 80% proximal to mid LAD lesion treated with 2.75 x 16 mm Synergy DES, 85% ramus lesion treated with a 3 x 12 mm Synergy DES postdilated to 3.3 mm, EF greater than 65%.  Yes EKG   Past Medical History:  Diagnosis Date  . Acute epiglottitis 01/2014  . BPH with obstruction/lower urinary tract symptoms    [facility] referral done 01/[facility] (Dr. Risa Spencer); ultimately a prostate biopsy was done for increased PSA velocity--April 2013--and it  was completely normal.  . CAD S/P percutaneous coronary angioplasty 05/05/2017   LEFT HEART CATH AND CORONARY ANGIOGRAPHY -- CORONARY STENT INTERVENTION Conclusion  LESION #1: Prox LAD to Mid LAD lesion, 80 %stenosed -->  A STENT SYNERGY DES 2.75X16 drug eluting stent was successfully placed. - Postdilated and tapered fashion to 3.1-2.9 mm LESION #2: Ramus lesion, 85 %stenosed. A STENT SYNERGY DES 3X12 drug eluting stent was successfully placed. Postdilated to 3.3 mm There is hyperdynamic left ventricular systolic function. The left ventricular ejection fraction is greater than 65% by visual estimate.    . Elevated coronary artery calcium score 03/2017   indicating possible 3 vessel CAD: plan is for cardiac cath per Dr. Ellyn Spencer as of 04/16/17 office f/u.  Marland Kitchen Elevated liver  function tests    mild  . Erectile dysfunction   . Family history of colon cancer    father  . Family history of hypertension    strong family history  . Family history of premature coronary heart disease    strong family history; Mother (MI in 110s, then 31s, s/p Valve Sgx in 45s) & oldest Brother (CABG x 4 in 28s)  . History of sciatica 11/2013   responded well to prednisone burst 11/2013  . Hx of colonic polyp 2007; 2014   Recall 07/2018 (Dr. Wynetta Emery at [place])  . Hyperlipidemia    On 10 mg Crestor  . Hypertension    On telmisartan  . Prediabetes 11/2016   HbA1c 6.1%     Past Surgical History:  Procedure Laterality Date  . COLONOSCOPY W/ POLYPECTOMY  09/02/06; 07/2013   [facility]: adenomatous polyp w/out high grade dysplasia or malignancy.  Recall 07/2018  . coronary calcium score     Very high risk: as of 03/2017 pt is to return to discuss with Dr. Ellyn Spencer: stress test vs cath as next step.  . CORONARY STENT INTERVENTION N/A 05/05/2017   Procedure: CORONARY STENT INTERVENTION;  Surgeon: Leonie Man, MD;  Location: [facility];  Service: Cardiovascular;: CAD S/P PCI: p-mLAD Synergy DES 2.75 x 16 (3.1-2.9 mm), p-mRI Synergy DES 3.0 x 12 (3.3 mm)  . dental implants    . LEFT HEART CATH AND CORONARY ANGIOGRAPHY N/A 05/05/2017   Procedure: LEFT HEART CATH AND CORONARY ANGIOGRAPHY;  Surgeon: Leonie Man, MD;  Location:  [facility];  Service: Cardiovascular;  Laterality: N/A; p-m LAD 80%, mRI 90% --> 2 vessel PCI  . PROSTATE BIOPSY  01/2012   Normal    Current Medications: Outpatient Medications Prior to Visit  Medication Sig Dispense Refill  . aspirin EC 81 MG tablet Take 81 mg by mouth daily.    . clopidogrel (PLAVIX) 75 MG tablet Take 1 tablet (75 mg total) by mouth daily with breakfast. 90 tablet 1  . Multiple Vitamin (MULTIVITAMIN WITH MINERALS) TABS tablet Take 1 tablet by mouth daily.    . nitroGLYCERIN (NITROSTAT) 0.4 MG SL tablet Place 1 tablet (0.4  mg total) under the tongue every 5 (five) minutes as needed for chest pain. 90 tablet 3  . rosuvastatin (CRESTOR) 20 MG tablet Take 2 tablets (40 mg total) by mouth daily. 90 tablet 3  . sildenafil (VIAGRA) 100 MG tablet Take 1 tablet (100 mg total) by mouth daily as needed for erectile dysfunction. 6 tablet 5  . telmisartan (MICARDIS) 40 MG tablet TAKE 1 TABLET BY MOUTH  DAILY 90 tablet 1   No facility-administered medications prior to visit.      Allergies:   Ace inhibitors and Shrimp [shellfish allergy]   Social History   Social History  . Marital status: Married    Spouse name: N/A  . Number of children: 2  . Years of education: N/A   Occupational History  . [employer] History Main Topics  . Smoking status: Never Smoker  . Smokeless tobacco: Never Used  . Alcohol use 1.2 oz/week    2 Glasses of wine per week  . Drug use: No  . Sexual activity: Yes    Partners: Female   Other Topics Concern  . Not on file   Social History Narrative   SH: Married, two teenagers (1 boy and 1 girl) as of 10/2015.   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   Exercise occasionally - most days the week he goes to the gym.              Family History:  The patient's ***family history includes Cancer in his father and sister; Coronary artery disease (age of onset: 37) in his brother; Diabetes in his sister and sister; Heart attack (age of onset: 72) in his mother; Heart disease (age of onset: 32) in his mother; Hyperlipidemia in his brother and mother; Hypertension in his brother, mother, sister, and sister; Valvular heart disease (age of onset: 50) in his mother.   ROS:   Please see the history of present illness.    ROS All other systems reviewed and are negative.   PHYSICAL EXAM:   VS:  There were no vitals taken for this visit.   GEN:  Well nourished, well developed, in no acute distress  HEENT: normal  Neck: no JVD, carotid bruits, or masses Cardiac: ***RRR; no murmurs, rubs, or gallops,no edema  Respiratory:  clear to auscultation bilaterally, normal work of breathing GI: soft, nontender, nondistended, + BS MS: no deformity or atrophy  Skin: warm and dry, no rash Neuro:  Alert and Oriented x 3, Strength and sensation are intact Psych: euthymic mood, full affect  Wt Readings from Last 3 Encounters:  05/06/17 169 lb 12.1 oz (77 kg)  04/16/17 163 lb  9.6 oz (74.2 kg)  03/15/17 167 lb 12.8 oz (76.1 kg)      Studies/Labs Reviewed:   EKG:  EKG is*** ordered today.  The ekg ordered today demonstrates ***  Recent Labs: 12/08/2016: ALT 52; TSH 0.73 05/06/2017: BUN 16; Creatinine, Ser 1.31; Hemoglobin 15.5; Platelets 187; Potassium 3.9; Sodium 136   Lipid Panel    Component Value Date/Time   CHOL 147 12/08/2016 0943   TRIG 220.0 (H) 12/08/2016 0943   HDL 29.80 (L) 12/08/2016 0943   CHOLHDL 5 12/08/2016 0943   VLDL 44.0 (H) 12/08/2016 0943   LDLCALC 97 10/23/2015 1112   LDLDIRECT 72.0 12/08/2016 0943    Additional studies/ records that were reviewed today include:   Cath 05/05/2017 Conclusion     LESION #1: Prox LAD to Mid LAD lesion, 80 %stenosed.  A STENT SYNERGY DES [id number] drug eluting stent was successfully placed. - Postdilated and tapered fashion to 3.1-2.9 mm  Post intervention, there is a 0% residual stenosis.  LESION #2: Ramus lesion, 85 %stenosed.  A STENT SYNERGY DES 3X12 drug eluting stent was successfully placed. Postdilated to 3.3 mm  Post intervention, there is a 0% residual stenosis.  There is hyperdynamic left ventricular systolic function. The left ventricular ejection fraction is greater than 65% by visual estimate.  LV end diastolic pressure is normal.   Severe 2 vessel disease involving the mid LAD and proximal ramus intermedius treated with 2 DES stents. Otherwise mild to  moderate disease elsewhere.  Hyperdynamic left ventricle with normal LVEDP.  Plan:  Overnight monitoring and post procedure unit with TR band removal per protocol. - With 2 vessel PCI, not appropriate for same-day discharge  Dual and platelet therapy for minimum one year with aspirin and Plavix  Increase simvastatin to 40 mm daily  Continue ARB, but no beta blocker due to bradycardia  Likely discharge tomorrow.      ASSESSMENT:    No diagnosis found.   PLAN:  In order of problems listed above:  1. ***    Medication Adjustments/Labs and Tests Ordered: Current medicines are reviewed at length with the patient today.  Concerns regarding medicines are outlined above.  Medication changes, Labs and Tests ordered today are listed in the Patient Instructions below. There are no Patient Instructions on file for this visit.   Hilbert Corrigan, [place]  05/20/2017 1:09 PM    [facility] [address], [place], [place]  [address] Phone: [phone]; Fax: [phone]

## 2017-05-21 ENCOUNTER — Telehealth (HOSPITAL_COMMUNITY): Payer: Self-pay

## 2017-05-21 NOTE — Telephone Encounter (Signed)
I called and left message on voicemail to call office about scheduling for cardiac rehab. I left office contact information on patient voicemail to return call.  ° °

## 2017-05-23 ENCOUNTER — Encounter: Payer: Self-pay | Admitting: Cardiology

## 2017-05-23 NOTE — Assessment & Plan Note (Signed)
Well-controlled on low-dose ARB. Hold off on beta blocker for now

## 2017-05-23 NOTE — Assessment & Plan Note (Signed)
Seems to be tolerating Crestor. He has not had an LDL check in a while now. His PCP is following with and the goal now should be LDL less than 70.

## 2017-05-23 NOTE — Assessment & Plan Note (Signed)
Notable improvement in symptoms following PCI. Line he is not requiring any nitroglycerin and never did fill the Imdur prescription.  For now I think is fine without Imdur

## 2017-05-23 NOTE — Assessment & Plan Note (Addendum)
Not convinced that he was having before, but he is still is noticing a little exertional dyspnea.  I encouraged him to gradually build back up to his previous exercise level. I think he may have been cutting back his activity for a longer period of time than he thought is now somewhat deconditioned.

## 2017-05-23 NOTE — Assessment & Plan Note (Signed)
High coronary calcium score correctly predicted significant CAD. He does a calcification in the RCA, but no obstruction.Continue risk factor modification

## 2017-05-23 NOTE — Assessment & Plan Note (Signed)
Somewhat surprisingly, he had significant lesions in the LAD and ramus intermedius treated with a DES stent in both. Hopefully he will be to start getting back to full activity now. I have a suspicion that some of his reluctance to do things is that he is fearful of the new diagnosis. I tried to reassure him that with revascularization, he should be better off than he was before. He is not on beta blocker because of resting bradycardia in the past. In with him having some exercise intolerance of symptoms now, would not start a beta blocker this point. He is on ARB and statin Continue aspirin and Plavix. My recommendation will be to continue aspirin and Plavix for a year and then stop aspirin

## 2017-05-30 ENCOUNTER — Encounter: Payer: Self-pay | Admitting: Family Medicine

## 2017-05-31 ENCOUNTER — Telehealth: Payer: Self-pay | Admitting: Cardiology

## 2017-05-31 DIAGNOSIS — R195 Other fecal abnormalities: Secondary | ICD-10-CM

## 2017-05-31 DIAGNOSIS — Z79899 Other long term (current) drug therapy: Secondary | ICD-10-CM

## 2017-05-31 DIAGNOSIS — K921 Melena: Secondary | ICD-10-CM

## 2017-05-31 NOTE — Telephone Encounter (Signed)
Spoke to patient . He states he notice this after procedures. Occurring each time with bowel movement. No change with foods or medications, patient has been using multivitamin prior to procedures. No abd pain.  Recent start on 81 mg aspirin and clopidogrel.  Patient aware will defer to Dr Ellyn Hack and contact with instructions.

## 2017-05-31 NOTE — Telephone Encounter (Signed)
New Message   pt verbalized that he is calling for rn   his stool is dark and tary

## 2017-06-01 NOTE — Telephone Encounter (Signed)
Check CBC & ? Home stool guaiac.  Glenetta Hew, MD

## 2017-06-02 NOTE — Telephone Encounter (Signed)
SPOKE TO PATIENT. INSTRUCTION GIVEN. PATIENT WILL NEEDED TO GO [facility] ON [address]  PATIENT VERBALIZED UNDERSTANDING.

## 2017-06-04 ENCOUNTER — Ambulatory Visit: Payer: 59 | Admitting: Cardiology

## 2017-06-09 ENCOUNTER — Encounter (HOSPITAL_COMMUNITY): Payer: Self-pay

## 2017-06-14 DIAGNOSIS — D509 Iron deficiency anemia, unspecified: Secondary | ICD-10-CM

## 2017-06-14 HISTORY — DX: Iron deficiency anemia, unspecified: D50.9

## 2017-06-17 ENCOUNTER — Telehealth (HOSPITAL_COMMUNITY): Payer: Self-pay

## 2017-06-17 NOTE — Telephone Encounter (Signed)
I called and left message on voicemail to call office about scheduling for cardiac rehab. I left office contact information on patient voicemail to return call.  ° °

## 2017-06-22 DIAGNOSIS — K921 Melena: Secondary | ICD-10-CM | POA: Diagnosis not present

## 2017-06-22 DIAGNOSIS — R195 Other fecal abnormalities: Secondary | ICD-10-CM | POA: Diagnosis not present

## 2017-06-22 DIAGNOSIS — Z79899 Other long term (current) drug therapy: Secondary | ICD-10-CM | POA: Diagnosis not present

## 2017-06-22 LAB — CBC
HEMATOCRIT: 34 % — AB (ref 37.5–51.0)
Hemoglobin: 11.3 g/dL — ABNORMAL LOW (ref 13.0–17.7)
MCH: 31.6 pg (ref 26.6–33.0)
MCHC: 33.2 g/dL (ref 31.5–35.7)
MCV: 95 fL (ref 79–97)
Platelets: 174 10*3/uL (ref 150–379)
RBC: 3.58 x10E6/uL — AB (ref 4.14–5.80)
RDW: 14 % (ref 12.3–15.4)
WBC: 3.6 10*3/uL (ref 3.4–10.8)

## 2017-06-23 ENCOUNTER — Telehealth: Payer: Self-pay | Admitting: *Deleted

## 2017-06-23 NOTE — Telephone Encounter (Signed)
-----   Message from Leonie Man, MD sent at 06/22/2017 10:48 PM EDT ----- It does look like he has had a drop in hemoglobin. I'm curious to see what his stool guaiac shows. Recommendation: Continue aspirin for the rest of this month then stop --> use Plavix alone after that. Will need to f/u with PCP to discuss possible GI referral - may need endoscopy if Guiac +.  Glenetta Hew, MD

## 2017-06-23 NOTE — Telephone Encounter (Signed)
LEFT MESSAGE PER DPR - INSTRUCTION CONCERNING ASPIRIN AND PLAVIX.  PLEASE CALL BACK LET OFFICE KNOW RECEIVED MESSAGE - FOLLOW PRIMARY INSTRUCTION AS WELL .

## 2017-06-24 NOTE — Telephone Encounter (Signed)
SPOKE TO PATIENT . PATIENT STATES HE HAD NOT RECEIVED MESSAGE.  INFORMATION GIVEN.    PATIENT STATE HE HAS NOT BEEN FEELING WELL SINCE PROCEDURE AND WOULD LIKE TO DISCUSS WITH DR Palmer.    APPOINTMENT SCHEDULE 06/29/17 AT 3 PM.

## 2017-06-25 ENCOUNTER — Telehealth: Payer: Self-pay | Admitting: *Deleted

## 2017-06-25 NOTE — Telephone Encounter (Signed)
Pt came by the office today to p/u the hemo cards. He wanted to know if he needed to do them since he just turned in some cards on Monday for Dr. Ellyn Hack? He also wanted to know if he should be taking any otc iron? Please advise. Thanks.

## 2017-06-26 ENCOUNTER — Encounter: Payer: Self-pay | Admitting: Family Medicine

## 2017-06-26 NOTE — Telephone Encounter (Signed)
He does not have to do the hemoccults since he already did some for Dr. Ellyn Hack. Regarding the iron, I would like him to first get iron level testing (what he has gotten so far is hemoglobin testing, which is not quite the same thing).  Lab visit for ferritin, TIBC, iron level.  Dx is normocytic anemia. This will help guide our recommendation regarding iron replacement therapy.-thx

## 2017-06-28 ENCOUNTER — Other Ambulatory Visit: Payer: Self-pay | Admitting: *Deleted

## 2017-06-28 DIAGNOSIS — D649 Anemia, unspecified: Secondary | ICD-10-CM

## 2017-06-28 NOTE — Telephone Encounter (Signed)
Pt advised and voiced understanding. He stated that he has an apt with Dr. Anitra Lauth on the 18th so he will have these labs done then. Future labs ordered.

## 2017-06-29 ENCOUNTER — Ambulatory Visit (INDEPENDENT_AMBULATORY_CARE_PROVIDER_SITE_OTHER): Payer: 59 | Admitting: Cardiology

## 2017-06-29 ENCOUNTER — Encounter: Payer: Self-pay | Admitting: Cardiology

## 2017-06-29 VITALS — BP 126/79 | HR 83 | Ht 67.0 in | Wt 161.4 lb

## 2017-06-29 DIAGNOSIS — I10 Essential (primary) hypertension: Secondary | ICD-10-CM | POA: Diagnosis not present

## 2017-06-29 DIAGNOSIS — I2510 Atherosclerotic heart disease of native coronary artery without angina pectoris: Secondary | ICD-10-CM

## 2017-06-29 DIAGNOSIS — Z9861 Coronary angioplasty status: Secondary | ICD-10-CM | POA: Diagnosis not present

## 2017-06-29 DIAGNOSIS — D500 Iron deficiency anemia secondary to blood loss (chronic): Secondary | ICD-10-CM

## 2017-06-29 DIAGNOSIS — I209 Angina pectoris, unspecified: Secondary | ICD-10-CM | POA: Diagnosis not present

## 2017-06-29 DIAGNOSIS — R0609 Other forms of dyspnea: Secondary | ICD-10-CM

## 2017-06-29 MED ORDER — PANTOPRAZOLE SODIUM 40 MG PO TBEC: 40.0000 mg | DELAYED_RELEASE_TABLET | Freq: Every day | ORAL | 6 refills | Status: DC

## 2017-06-29 NOTE — Progress Notes (Signed)
PCP: Tammi Sou, MD  Clinic Note: Chief Complaint  Patient presents with  . Follow-up    Anemia  . Coronary Artery Disease    HPI: Thomas Spencer is a 51 y.o. male who is being seen today for shor follow-up to discuss new Dx of Anemia post PCI.  Thomas Spencer was initially seen in consultation for exertional dyspnea and occasional chest discomfort at the request of McGowen, Adrian Blackwater, MD back in early July. He was noticing dyspnea climbing of stairs and heart fluttering. We evaluated within the coronary calcium score that was read as abnormal and suggested 3 vessel disease. --> elevated  coronary calcium score --he was also noticing some exertional chest discomfort as well. We therefore decided to take him to the cardiac catheterization lab. -- Noted to have LAD & RI disease - 2 V PCI.    Thomas Spencer was last seen on August 3 in follow-up from his   Recent Hospitalizations: Overnight observation following cardiac catheterization and PCI.  Studies Personally Reviewed - (if available, images/films reviewed: From Epic Chart or Care Everywhere) CBC Latest Ref Rng & Units 06/22/2017 05/06/2017  WBC 4.0 - 10.5 K/uL 3.6 5.1  Hemoglobin 13.0 - 17.0 g/dL 11.3(L) 15.5  Hematocrit 39.0 - 52.0 % 34.0(L) 44.5  Platelets 150.0 - 400.0 K/uL 174 187    Interval History: Thomas Spencer returns today To discuss the finding of persistent anemia postcatheterization. He says he just hasn't really been feeling him fully up to par since his PCI. He clearly denies any further episodes of exertional dyspnea or angina, he just feels tired and a bit short of breath. On his elbow up CBCs that he has been anemic. He has not noted frank much easier or melena, he has noted dark stools. Permit me he has had some relatively normal stools, but he is definitely has dark stools. He has not missed anatomy significant upset stomach.  Dark stools for ~ 2 weeks.  He denies any PND, orthopnea or edema. No rapid irregular heartbeats  palpitations. No syncope/near syncope or TIAs amaurosis fugax. No claudication.  No claudication.  ROS: A comprehensive was performed. Review of Systems  Constitutional: Positive for malaise/fatigue (Gradually building self backup for activity).  HENT: Negative for congestion and nosebleeds.   Respiratory: Positive for cough (dry cough). Negative for shortness of breath and wheezing.   Gastrointestinal: Negative for abdominal pain, blood in stool, constipation and diarrhea.       Dark tarry stools noted ? melanotic  Genitourinary: Negative for hematuria.  Musculoskeletal: Negative for joint pain and myalgias.  Neurological: Negative for dizziness.  Endo/Heme/Allergies: Does not bruise/bleed easily.  All other systems reviewed and are negative.   I have reviewed and (if needed) personally updated the patient's problem list, medications, allergies, past medical and surgical history, social and family history.   Past Medical History:  Diagnosis Date  . Acute epiglottitis 01/2014  . Anemia 06/2017   Hb drop from 15 down to 11 from 04/2017-06/2017.  Hemoccults and iron testing pending  . BPH with obstruction/lower urinary tract symptoms    [facility] referral done 01/2011 (Dr. Risa Grill); ultimately a prostate biopsy was done for increased PSA velocity--April 2013--and it  was completely normal.  . CAD S/P percutaneous coronary angioplasty 05/05/2017   LEFT HEART CATH AND CORONARY ANGIOGRAPHY -- CORONARY STENT INTERVENTION Conclusion  LESION #1: Prox LAD to Mid LAD lesion, 80 %stenosed -->  A STENT SYNERGY DES 2.75X16 drug eluting stent was successfully placed. - Postdilated  and tapered fashion to 3.1-2.9 mm LESION #2: Ramus lesion, 85 %stenosed. A STENT SYNERGY DES 3X12 drug eluting stent successfully placed. ASA +Plvx x 1 yr, then stop ASA  . Elevated coronary artery calcium score 03/2017   indicating possible 3 vessel CAD: cardiac cath per Dr. Ellyn Hack 8/22 revealed 2 Vessel obstructive  CAD in LAD & RI   . Elevated liver function tests    mild  . Erectile dysfunction   . Family history of colon cancer    father  . Family history of hypertension    strong family history  . Family history of premature coronary heart disease    strong family history; Mother (MI in 54s, then 18s, s/p Valve Sgx in 56s) & oldest Brother (CABG x 4 in 84s)  . History of sciatica 11/2013   responded well to prednisone burst 11/2013  . Hx of colonic polyp 2007; 2014   Recall 07/2018 (Dr. Wynetta Emery at [facility])  . Hyperlipidemia    On 40 mg Crestor as of 06/2017  . Hypertension    On telmisartan  . Prediabetes 11/2016   HbA1c 6.1%     Past Surgical History:  Procedure Laterality Date  . COLONOSCOPY W/ POLYPECTOMY  09/02/06; 07/2013   [facility]: adenomatous polyp w/out high grade dysplasia or malignancy.  Recall 07/2018  . coronary calcium score     Very high risk: as of 03/2017 pt is to return to discuss with Dr. Ellyn Hack: stress test vs cath as next step.  . CORONARY STENT INTERVENTION N/A 05/05/2017   DES x 2. Preserved LV function.  Procedure: CORONARY STENT INTERVENTION;  Surgeon: Leonie Man, MD;  Location: [facility];  Service: Cardiovascular;: CAD S/P PCI: p-mLAD Synergy DES 2.75 x 16 (3.1-2.9 mm), p-mRI Synergy DES 3.0 x 12 (3.3 mm)  . dental implants    . LEFT HEART CATH AND CORONARY ANGIOGRAPHY N/A 05/05/2017   Procedure: LEFT HEART CATH AND CORONARY ANGIOGRAPHY;  Surgeon: Leonie Man, MD;  Location: [facility];  Service: Cardiovascular;  Laterality: N/A; p-m LAD 80%, mRI 90% --> 2 vessel PCI  . PROSTATE BIOPSY  01/2012   Normal   Diagnostic Diagram 05/05/2017    Post-Intervention Diagram             PCI LAD Synergy DES: 2.75x 16 (~3.0); PCI RI Synergy 3.0 x 12 (3.3 mm)                Current Meds  Medication Sig  . aspirin EC 81 MG tablet Take 81 mg by mouth daily.  . clopidogrel (PLAVIX) 75 MG tablet Take 1 tablet (75 mg total) by mouth daily with  breakfast.  . Multiple Vitamin (MULTIVITAMIN WITH MINERALS) TABS tablet Take 1 tablet by mouth daily.  . nitroGLYCERIN (NITROSTAT) 0.4 MG SL tablet Place 1 tablet (0.4 mg total) under the tongue every 5 (five) minutes as needed for chest pain.  . sildenafil (VIAGRA) 100 MG tablet Take 1 tablet (100 mg total) by mouth daily as needed for erectile dysfunction.  Marland Kitchen telmisartan (MICARDIS) 40 MG tablet TAKE 1 TABLET BY MOUTH  DAILY  . [DISCONTINUED] rosuvastatin (CRESTOR) 20 MG tablet Take 2 tablets (40 mg total) by mouth daily.    Allergies  Allergen Reactions  . Ace Inhibitors Cough  . Shrimp [Shellfish Allergy] Other (See Comments)    Swelling around eyes/lips---can eat IF cooked WELL    Social History   Social History  . Marital status: Married  Spouse name: N/A  . Number of children: 2  . Years of education: N/A   Occupational History  . [employer] History Main Topics  . Smoking status: Never Smoker  . Smokeless tobacco: Never Used  . Alcohol use 1.2 oz/week    2 Glasses of wine per week  . Drug use: No  . Sexual activity: Yes    Partners: Female   Other Topics Concern  . None   Social History Narrative   SH: Married, two teenagers (1 boy and 1 girl) as of 10/2015.   He is a Personal assistant, lives in [place], [place].   He has a PhD in a vertical engineering any [employer] in intellectual property   No Tobacco, rare ETOH, no drug use.   Exercise occasionally - most days the week he goes to the gym.             family history includes Cancer in his father and sister; Coronary artery disease (age of onset: 23) in his brother; Diabetes in his sister and sister; Heart attack (age of onset: 34) in his mother; Heart disease (age of onset: 38) in his mother; Hyperlipidemia in his brother and mother; Hypertension in his brother, mother, sister, and sister; Valvular heart disease (age of onset: 89) in his mother.  Wt Readings from Last 3 Encounters:    07/01/17 162 lb (73.5 kg)  06/29/17 161 lb 6.4 oz (73.2 kg)  05/20/17 162 lb (73.5 kg)    PHYSICAL EXAM BP 126/79   Pulse 83   Ht 5\' 7"  (1.702 m)   Wt 161 lb 6.4 oz (73.2 kg)   SpO2 100%   BMI 25.28 kg/m  Physical Exam  Constitutional: He is oriented to person, place, and time. He appears well-developed and well-nourished. No distress.  Well-groomed.  HENT:  Head: Normocephalic and atraumatic.  Conjunctiva not overly pale.  Eyes: EOM are normal. No scleral icterus.  Neck: Trachea normal and normal range of motion. Neck supple. No hepatojugular reflux and no JVD present. Carotid bruit is not present.  Cardiovascular: Normal rate, regular rhythm, normal heart sounds and intact distal pulses.   Occasional extrasystoles are present. PMI is not displaced.  Exam reveals no gallop and no friction rub.   No murmur heard. Pulmonary/Chest: Breath sounds normal. No respiratory distress. He has no wheezes. He has no rales.  Abdominal: Soft. Bowel sounds are normal. He exhibits no distension. There is no tenderness. There is no rebound.  Musculoskeletal:  Right radial cath site clean dry and intact. Cannot even see the access site  Neurological: He is alert and oriented to person, place, and time.  Skin: Skin is warm and dry. No rash noted. No erythema. No pallor.  Psychiatric: He has a normal mood and affect. His behavior is normal. Judgment and thought content normal.  Nursing note and vitals reviewed.    Adult ECG Report n/a  Other studies Reviewed: Additional studies/ records that were reviewed today include:  Recent Labs:   Lab Results  Component Value Date   CHOL 147 12/08/2016   HDL 29.80 (L) 12/08/2016   LDLCALC 97 10/23/2015   LDLDIRECT 72.0 12/08/2016   TRIG 220.0 (H) 12/08/2016   CHOLHDL 5 12/08/2016   Lab Results  Component Value Date   CREATININE 1.35 07/01/2017   BUN 19 07/01/2017   NA 139 07/01/2017   K 4.6 07/01/2017   CL 104 07/01/2017   CO2 30  07/01/2017    Lab Results  Component Value Date   WBC 3.7 (L) 07/01/2017   HGB 9.6 (L) 07/01/2017   HCT 29.1 (L) 07/01/2017   MCV 93.3 07/01/2017   PLT 301.0 07/01/2017   ASSESSMENT / PLAN: Problem List Items Addressed This Visit    Anemia due to blood loss    Normocytic anemia based on initial smear, however I suspect that it probably is microcytic iron deficiency anemia from GI blood loss.Maryjo Rochester tarry stools for about 2 weeks after starting dual antiplatelet therapy.  Most recent labs did not have to low hemoglobin level, however they do go below 10 would probably consider stopping aspirin altogether. For now I would like to try see if he can continue aspirin for the first 3 months. His PCP has artery been notified of the results of his most recent hemoglobin evaluation. He is due to see his PCP soon to discuss further treatment. He'll be referred to GI for GI evaluation.  Plan: Start Protonix. If he can make it through Thanksgiving with stable hemoglobin levels I would continue aspirin through then. If not he can stop now. Otherwise he would stop atThanksgiving and continue Plavix.       Angina, class III (HCC) (Chronic)    No further angina following PCI      CAD S/P PCI: p-mLAD Synergy DES 2.75 x 16 (3.1-2.9 mm), p-mRI Synergy DES 3.0 x 12 (3.3 mm) (Chronic)    Status post PCI but the LAD and the ramus intermedius with DES stents. He is on statin and ARB., Especially in light of his anemia I would not want to add a beta blocker at this time.  He is on aspirin and Plavix. I would like to see if we can continue Plavix plus aspirin for the remainder of the first 3 months, but if necessary we can stop aspirin --> he is due to follow up with his PCP soon. They're planning on doing more iron studies etc. I would also suspect he probably will be going to GI.  Plan for now will be continue aspirin plus Plavix until Thanksgiving and then stop it for least a month. However if his hemoglobin continues  to drop, I think we simply need to stop aspirin. I would like to continue Plavix since he just had stents placed.  I will start PPI (Protonix) both for possible upper GI bleed as well as for his dry cough. I will defer further anemia evaluation to his PCP. He said try to get in to see him this week.  Probably will need to start iron supplementation, but again will defer that to PCP.      Dyspnea on exertion    I don't think these exertional dyspnea is anginal in nature. Is probably more related to anemia. I suspect as his hemoglobin improves, this will improve.      Essential hypertension (Chronic)    Controlled on low-dose ARB. Hold off on beta blocker         Current medicines are reviewed at length with the patient today. (+/- concerns) None The following changes have been made: None  Patient Instructions  MEDICATION CHANGES   CONTINUE ASPIRIN  UNTIL THANKSGIVING TROUGH CHRISTMAS - DO NOT TAKE DURING THIS PERIOD OF TIME THEN RESTART ASPIRIN ALONG WITH OTHER MEDICATIONS  START PANTOPRAZOLE 40 MG  TAKE IN THE MONRING   DISCUSS WITH PRIMARY ON STARTING IRON TABLETS.   Your physician recommends that you schedule a follow-up appointment  in 3 MONTHS WITH DR Milina Pagett.  If you need a refill on your cardiac medications before your next appointment, please call your pharmacy.     Studies Ordered:   No orders of the defined types were placed in this encounter.     Glenetta Hew, M.D., M.S. Interventional Cardiologist   Pager # [phone] Phone # [phone] [address] [place], [place] [address]

## 2017-06-29 NOTE — Patient Instructions (Signed)
MEDICATION CHANGES   CONTINUE ASPIRIN  UNTIL THANKSGIVING TROUGH CHRISTMAS - DO NOT TAKE DURING THIS PERIOD OF TIME THEN RESTART ASPIRIN ALONG WITH OTHER MEDICATIONS  START PANTOPRAZOLE 40 MG  TAKE IN THE MONRING   DISCUSS WITH PRIMARY ON STARTING IRON TABLETS.   Your physician recommends that you schedule a follow-up appointment in [place].  If you need a refill on your cardiac medications before your next appointment, please call your pharmacy.

## 2017-07-01 ENCOUNTER — Encounter: Payer: Self-pay | Admitting: Cardiology

## 2017-07-01 ENCOUNTER — Ambulatory Visit (INDEPENDENT_AMBULATORY_CARE_PROVIDER_SITE_OTHER): Payer: 59 | Admitting: Family Medicine

## 2017-07-01 ENCOUNTER — Encounter: Payer: Self-pay | Admitting: Family Medicine

## 2017-07-01 VITALS — BP 113/70 | HR 81 | Temp 97.8°F | Resp 16 | Ht 67.0 in | Wt 162.0 lb

## 2017-07-01 DIAGNOSIS — R05 Cough: Secondary | ICD-10-CM

## 2017-07-01 DIAGNOSIS — D649 Anemia, unspecified: Secondary | ICD-10-CM

## 2017-07-01 DIAGNOSIS — Z23 Encounter for immunization: Secondary | ICD-10-CM

## 2017-07-01 DIAGNOSIS — K219 Gastro-esophageal reflux disease without esophagitis: Secondary | ICD-10-CM | POA: Diagnosis not present

## 2017-07-01 DIAGNOSIS — K921 Melena: Secondary | ICD-10-CM

## 2017-07-01 DIAGNOSIS — R058 Other specified cough: Secondary | ICD-10-CM

## 2017-07-01 DIAGNOSIS — D500 Iron deficiency anemia secondary to blood loss (chronic): Secondary | ICD-10-CM | POA: Insufficient documentation

## 2017-07-01 LAB — POC HEMOCCULT BLD/STL (OFFICE/1-CARD/DIAGNOSTIC): FECAL OCCULT BLD: NEGATIVE

## 2017-07-01 LAB — COMPREHENSIVE METABOLIC PANEL
ALT: 39 U/L (ref 0–53)
AST: 28 U/L (ref 0–37)
Albumin: 4.1 g/dL (ref 3.5–5.2)
Alkaline Phosphatase: 50 U/L (ref 39–117)
BUN: 19 mg/dL (ref 6–23)
CALCIUM: 9.4 mg/dL (ref 8.4–10.5)
CHLORIDE: 104 meq/L (ref 96–112)
CO2: 30 meq/L (ref 19–32)
CREATININE: 1.35 mg/dL (ref 0.40–1.50)
GFR: 70.96 mL/min (ref >60.00)
Glucose, Bld: 83 mg/dL (ref 70–99)
POTASSIUM: 4.6 meq/L (ref 3.5–5.1)
Sodium: 139 mEq/L (ref 135–145)
Total Bilirubin: 0.4 mg/dL (ref 0.2–1.2)
Total Protein: 6.5 g/dL (ref 6.0–8.3)

## 2017-07-01 LAB — CBC WITH DIFFERENTIAL/PLATELET
BASOS ABS: 0 10*3/uL (ref 0.0–0.1)
BASOS PCT: 1 % (ref 0.0–3.0)
Eosinophils Absolute: 0.1 10*3/uL (ref 0.0–0.7)
Eosinophils Relative: 3.9 % (ref 0.0–5.0)
HCT: 29.1 % — ABNORMAL LOW (ref 39.0–52.0)
HEMOGLOBIN: 9.6 g/dL — AB (ref 13.0–17.0)
LYMPHS PCT: 25.6 % (ref 12.0–46.0)
Lymphs Abs: 1 10*3/uL (ref 0.7–4.0)
MCHC: 33 g/dL (ref 30.0–36.0)
MCV: 93.3 fl (ref 78.0–100.0)
MONO ABS: 0.4 10*3/uL (ref 0.1–1.0)
Monocytes Relative: 10.5 % (ref 3.0–12.0)
Neutro Abs: 2.2 10*3/uL (ref 1.4–7.7)
Neutrophils Relative %: 59 % (ref 43.0–77.0)
PLATELETS: 301 10*3/uL (ref 150.0–400.0)
RBC: 3.12 Mil/uL — ABNORMAL LOW (ref 4.22–5.81)
RDW: 14.1 % (ref 11.5–15.5)
WBC: 3.7 10*3/uL — AB (ref 4.0–10.5)

## 2017-07-01 LAB — FOLATE: Folate: >23.9 ng/mL (ref >5.9)

## 2017-07-01 LAB — IRON: IRON: 18 ug/dL — AB (ref 42–165)

## 2017-07-01 LAB — IFOBT (OCCULT BLOOD): IMMUNOLOGICAL FECAL OCCULT BLOOD TEST: NEGATIVE

## 2017-07-01 LAB — VITAMIN B12: VITAMIN B 12: 904 pg/mL (ref 211–911)

## 2017-07-01 LAB — FERRITIN: FERRITIN: 7.6 ng/mL — AB (ref 22.0–322.0)

## 2017-07-01 MED ORDER — ROSUVASTATIN CALCIUM 40 MG PO TABS: 40.0000 mg | ORAL_TABLET | Freq: Every day | ORAL | 3 refills | Status: DC

## 2017-07-01 NOTE — Assessment & Plan Note (Signed)
Controlled on low-dose ARB. Hold off on beta blocker

## 2017-07-01 NOTE — Assessment & Plan Note (Signed)
I don't think these exertional dyspnea is anginal in nature. Is probably more related to anemia. I suspect as his hemoglobin improves, this will improve.

## 2017-07-01 NOTE — Progress Notes (Signed)
OFFICE VISIT  07/01/2017   CC:  Chief Complaint  Patient presents with  . Follow-up    low blood count   HPI:    Patient is a 51 y.o. African-American male who presents accompanied by his wife [name] for follow up/evaluation of recently detected anemia. He was at his baseline Hb of 15-16 in mid August this year, then recheck CBC showed his Hb had dropped to 11.3 on 05/23/17.  He got PCI with stent to LAD 05/05/17 and was put on DAPT by Dr. Ellyn Hack and was doing well at cardiology f/u. On 06/22/17, Dr. Ellyn Hack gave pt instructions to continue aspirin for the rest of this month then stop --> use Plavix alone after that. Hemoccult cards were given to pt by Dr. Allison Quarry and the result was neg.  It appears this was a fecal immunochemical assay rather than hemoccult.  He did feel some fatigue, intermittent DOE around the time he was dx'd with anemia.  He has not started any iron. He has had some melena intermittently--a pic from his phone does show stool c/w melena.  The melena was daily for approx 10-14d.  No BRBPR.   Denies upper abd/epigastric discomfort but not pain.  Drinking milk soothes this.  Coffee does make it worse. For the last week he has been taking ASA in AM and plavix in PM.    His cardiologist has increased his crestor recently.   Has had dry cough that has not been any worse after increased dose of crestor. He was recently rx'd protonix by Dr. Ellyn Hack for this and pt has not started it yet.   Past Medical History:  Diagnosis Date  . Acute epiglottitis 01/2014  . Anemia 06/2017   Hb drop from 15 down to 11 from 04/2017-06/2017.  Hemoccults and iron testing pending  . BPH with obstruction/lower urinary tract symptoms    [facility] referral done 01/[facility] (Dr. Risa Grill); ultimately a prostate biopsy was done for increased PSA velocity--April 2013--and it  was completely normal.  . CAD S/P percutaneous coronary angioplasty 05/05/2017   LEFT HEART CATH AND CORONARY ANGIOGRAPHY  -- CORONARY STENT INTERVENTION Conclusion  LESION #1: Prox LAD to Mid LAD lesion, 80 %stenosed -->  A STENT SYNERGY DES 2.75X16 drug eluting stent was successfully placed. - Postdilated and tapered fashion to 3.1-2.9 mm LESION #2: Ramus lesion, 85 %stenosed. A STENT SYNERGY DES 3X12 drug eluting stent successfully placed. ASA +Plvx x 1 yr, then stop ASA  . Elevated coronary artery calcium score 03/2017   indicating possible 3 vessel CAD: cardiac cath per Dr. Ellyn Hack 8/22 revealed 2 Vessel obstructive CAD in LAD & RI   . Elevated liver function tests    mild  . Erectile dysfunction   . Family history of colon cancer    father  . Family history of hypertension    strong family history  . Family history of premature coronary heart disease    strong family history; Mother (MI in 13s, then 35s, s/p Valve Sgx in 74s) & oldest Brother (CABG x 4 in 69s)  . History of sciatica 11/2013   responded well to prednisone burst 11/2013  . Hx of colonic polyp 2007; 2014   Recall 07/2018 (Dr. Wynetta Emery at [facility])  . Hyperlipidemia    On 40 mg Crestor as of 06/2017  . Hypertension    On telmisartan  . Prediabetes 11/2016   HbA1c 6.1%     Past Surgical History:  Procedure Laterality Date  . COLONOSCOPY  W/ POLYPECTOMY  09/02/06; 07/2013   [facility]: adenomatous polyp w/out high grade dysplasia or malignancy.  Recall 07/2018  . coronary calcium score     Very high risk: as of 03/2017 pt is to return to discuss with Dr. Ellyn Hack: stress test vs cath as next step.  . CORONARY STENT INTERVENTION N/A 05/05/2017   DES x 2. Preserved LV function.  Procedure: CORONARY STENT INTERVENTION;  Surgeon: Leonie Man, MD;  Location: [facility];  Service: Cardiovascular;: CAD S/P PCI: p-mLAD Synergy DES 2.75 x 16 (3.1-2.9 mm), p-mRI Synergy DES 3.0 x 12 (3.3 mm)  . dental implants    . LEFT HEART CATH AND CORONARY ANGIOGRAPHY N/A 05/05/2017   Procedure: LEFT HEART CATH AND CORONARY ANGIOGRAPHY;  Surgeon: Leonie Man, MD;  Location: [facility];  Service: Cardiovascular;  Laterality: N/A; p-m LAD 80%, mRI 90% --> 2 vessel PCI  . PROSTATE BIOPSY  01/2012   Normal    Outpatient Medications Prior to Visit  Medication Sig Dispense Refill  . aspirin EC 81 MG tablet Take 81 mg by mouth daily.    . clopidogrel (PLAVIX) 75 MG tablet Take 1 tablet (75 mg total) by mouth daily with breakfast. 90 tablet 1  . Multiple Vitamin (MULTIVITAMIN WITH MINERALS) TABS tablet Take 1 tablet by mouth daily.    . nitroGLYCERIN (NITROSTAT) 0.4 MG SL tablet Place 1 tablet (0.4 mg total) under the tongue every 5 (five) minutes as needed for chest pain. 90 tablet 3  . pantoprazole (PROTONIX) 40 MG tablet Take 1 tablet (40 mg total) by mouth daily. 30 tablet 6  . sildenafil (VIAGRA) 100 MG tablet Take 1 tablet (100 mg total) by mouth daily as needed for erectile dysfunction. 6 tablet 5  . telmisartan (MICARDIS) 40 MG tablet TAKE 1 TABLET BY MOUTH  DAILY 90 tablet 1  . rosuvastatin (CRESTOR) 20 MG tablet Take 2 tablets (40 mg total) by mouth daily. 90 tablet 3   No facility-administered medications prior to visit.     Allergies  Allergen Reactions  . Ace Inhibitors Cough  . Shrimp [Shellfish Allergy] Other (See Comments)    Swelling around eyes/lips---can eat IF cooked WELL    ROS As per HPI  PE: Blood pressure 113/70, pulse 81, temperature 97.8 F (36.6 C), temperature source Oral, resp. rate 16, height 5\' 7"  (1.702 m), weight 162 lb (73.5 kg), SpO2 100 %. Gen: Alert, well appearing.  Patient is oriented to person, place, time, and situation. AFFECT: pleasant, lucid thought and speech. ENT:  Eyes: no injection, icteris, swelling, or exudate.  EOMI, PERRLA. Nose: no drainage or turbinate edema/swelling.  No injection or focal lesion.  Mouth: lips without lesion/swelling.  Oral mucosa pink and moist.  Dentition intact and without obvious caries or gingival swelling.  Oropharynx without erythema, exudate, or  swelling.  Neck - No masses or thyromegaly or limitation in range of motion CV: RRR, no m/r/g.   LUNGS: CTA bilat, nonlabored resps, good aeration in all lung fields. ABD: soft, NT, ND, BS normal.  No hepatospenomegaly or mass.  No bruits. EXT: no clubbing, cyanosis, or edema.  Skin - no sores or suspicious lesions or rashes or color changes Rectal exam: negative without mass, lesions or tenderness.  Hemoccult testing of brown stool wipings was NEG today.  LABS:  Lab Results  Component Value Date   TSH 0.73 12/08/2016   Lab Results  Component Value Date   WBC 3.6 06/22/2017  HGB 11.3 (L) 06/22/2017   HCT 34.0 (L) 06/22/2017   MCV 95 06/22/2017   PLT 174 06/22/2017  No results found for: IRON, TIBC, FERRITIN No results found for: VITAMINB12  Lab Results  Component Value Date   CREATININE 1.31 (H) 05/06/2017   BUN 16 05/06/2017   NA 136 05/06/2017   K 3.9 05/06/2017   CL 106 05/06/2017   CO2 23 05/06/2017   Lab Results  Component Value Date   ALT 52 12/08/2016   AST 31 12/08/2016   ALKPHOS 50 12/08/2016   BILITOT 0.7 12/08/2016   Lab Results  Component Value Date   CHOL 147 12/08/2016   Lab Results  Component Value Date   HDL 29.80 (L) 12/08/2016   Lab Results  Component Value Date   LDLCALC 97 10/23/2015   Lab Results  Component Value Date   TRIG 220.0 (H) 12/08/2016   Lab Results  Component Value Date   CHOLHDL 5 12/08/2016   Lab Results  Component Value Date   PSA 0.68 12/08/2016   PSA 0.61 10/23/2015   PSA 0.59 07/03/2014   Lab Results  Component Value Date   HGBA1C 6.1 12/08/2016   Immunochemical Assay for occult blood 06/23/17--NEG  Hemoccult x 1 in office today: NEG.  IMPRESSION AND PLAN:  1) Acute anemia, with signs of upper GI bleeding.  Onset of this was after starting DAPT when he got angioplasty/stent 04/2017.  Plan per cardiology is to stop ASA at the end of October and continue plavix only after that. Will do complete normocytic  anemia eval (CBC repeat, iron testing, vit B12, folate, haptoglobin, CMET. Pt currently asymptomatic except mild fatigue. Seems to no longer have melena. Will have him see GI--he sees [facility], but pt states some potential difficulty with seeing them for endoscopy again due to insurance issues last time he got colonoscopy with them.  If this is still a problem, will refer to [facility]. Will see what iron studies show before advising him on iron replacement dosing.  2) Dry cough, suspect LPR--agree with PPI rx'd by cardiologist recently and encouraged pt to start taking this now.  Spent 30 min with pt today, with >50% of this time spent in counseling and care coordination regarding the above problems.  An After Visit Summary was printed and given to the patient.  FOLLOW UP: Return in about 6 weeks (around 08/12/2017) for f/u anemia.  Signed:  Crissie Sickles, MD           07/01/2017

## 2017-07-01 NOTE — Assessment & Plan Note (Signed)
No further angina following PCI 

## 2017-07-01 NOTE — Assessment & Plan Note (Signed)
Normocytic anemia based on initial smear, however I suspect that it probably is microcytic iron deficiency anemia from GI blood loss.Thomas Spencer tarry stools for about 2 weeks after starting dual antiplatelet therapy.  Most recent labs did not have to low hemoglobin level, however they do go below 10 would probably consider stopping aspirin altogether. For now I would like to try see if he can continue aspirin for the first 3 months. His PCP has artery been notified of the results of his most recent hemoglobin evaluation. He is due to see his PCP soon to discuss further treatment. He'll be referred to GI for GI evaluation.  Plan: Start Protonix. If he can make it through Thanksgiving with stable hemoglobin levels I would continue aspirin through then. If not he can stop now. Otherwise he would stop atThanksgiving and continue Plavix.

## 2017-07-01 NOTE — Assessment & Plan Note (Signed)
Status post PCI but the LAD and the ramus intermedius with DES stents. He is on statin and ARB., Especially in light of his anemia I would not want to add a beta blocker at this time.  He is on aspirin and Plavix. I would like to see if we can continue Plavix plus aspirin for the remainder of the first 3 months, but if necessary we can stop aspirin --> he is due to follow up with his PCP soon. They're planning on doing more iron studies etc. I would also suspect he probably will be going to GI.  Plan for now will be continue aspirin plus Plavix until Thanksgiving and then stop it for least a month. However if his hemoglobin continues to drop, I think we simply need to stop aspirin. I would like to continue Plavix since he just had stents placed.  I will start PPI (Protonix) both for possible upper GI bleed as well as for his dry cough. I will defer further anemia evaluation to his PCP. He said try to get in to see him this week.  Probably will need to start iron supplementation, but again will defer that to PCP.

## 2017-07-02 LAB — IRON AND TIBC
IRON: 23 ug/dL — AB (ref 38–169)
Iron Saturation: 6 % — CL (ref 15–55)
Total Iron Binding Capacity: 361 ug/dL (ref 250–450)
UIBC: 338 ug/dL (ref 111–343)

## 2017-07-02 LAB — HAPTOGLOBIN: HAPTOGLOBIN: 118 mg/dL (ref 43–212)

## 2017-07-06 ENCOUNTER — Telehealth: Payer: Self-pay | Admitting: *Deleted

## 2017-07-06 ENCOUNTER — Other Ambulatory Visit: Payer: Self-pay | Admitting: Family Medicine

## 2017-07-06 DIAGNOSIS — K922 Gastrointestinal hemorrhage, unspecified: Secondary | ICD-10-CM

## 2017-07-06 DIAGNOSIS — D509 Iron deficiency anemia, unspecified: Secondary | ICD-10-CM

## 2017-07-06 NOTE — Telephone Encounter (Signed)
Ordered entered into EPIC.  Diane is working on referral now.

## 2017-07-06 NOTE — Telephone Encounter (Signed)
Patient advised ot OTC ferrous Sulfate instructions.

## 2017-07-06 NOTE — Telephone Encounter (Signed)
Pt came to office today stating that he received 40mg  tablets for his atorvastatin. Pt stated that he has only been taking 20mg  daily. He stated that he was taking 10mg  then it was increased to 20mg . I have reviewed pts chart but I did not see where this medication has been changed. Please advise which dose pt should be taking. Thanks.

## 2017-07-06 NOTE — Telephone Encounter (Signed)
Hello. Please do me a huge favor. For this patient please order a gastroenterology referral to the office that he stated that was in his network. The reason for the referral is iron deficiency anemia, with upperGastrointestinal bleeding. Please mark the referral as urgent. The reason for the urgency is that the patient's hemoglobin continues to drop.He has old G.I. records - past colonoscopy records - in the media section of his chart.  Also has labs and my most recent office note in the EMR to send.  These are from [facility]. Advise him to take one over the counter Ferrous sulfate 325 mg tab twice per day. Take at least once per day if unable to tolerate twice per day. I know this was a long message, so please text me to my cell phone or call my cell phone if there is any question. Thank you so much.

## 2017-07-06 NOTE — Telephone Encounter (Signed)
Pt LMOM on 07/05/17 at 5:00pm stating that he would like a referral to [facility] for his colonoscopy. Pt stated that they are in network. Address: [address] Phone: [phone].  Pt also wanted to know if Dr. Anitra Lauth thinks he should be taking an iron supplement. Please advise. Thanks.

## 2017-07-06 NOTE — Telephone Encounter (Signed)
20 mg Crestor is correct.  He can either split his 40 mg tabs in half or we can send in new rx for Crestor 20 mg.

## 2017-07-06 NOTE — Telephone Encounter (Signed)
Agreed  DH 

## 2017-07-07 DIAGNOSIS — D500 Iron deficiency anemia secondary to blood loss (chronic): Secondary | ICD-10-CM | POA: Diagnosis not present

## 2017-07-07 DIAGNOSIS — K921 Melena: Secondary | ICD-10-CM | POA: Diagnosis not present

## 2017-07-07 DIAGNOSIS — Z86010 Personal history of colonic polyps: Secondary | ICD-10-CM | POA: Diagnosis not present

## 2017-07-07 MED ORDER — ROSUVASTATIN CALCIUM 20 MG PO TABS: 20.0000 mg | ORAL_TABLET | Freq: Every day | ORAL | 3 refills | Status: DC

## 2017-07-07 NOTE — Telephone Encounter (Signed)
Pt advised and voiced understanding. Pt requested to have new Rx sent. New Rx sent for 20mg .

## 2017-07-08 DIAGNOSIS — K2950 Unspecified chronic gastritis without bleeding: Secondary | ICD-10-CM | POA: Diagnosis not present

## 2017-07-08 DIAGNOSIS — B9681 Helicobacter pylori [H. pylori] as the cause of diseases classified elsewhere: Secondary | ICD-10-CM | POA: Diagnosis not present

## 2017-07-08 DIAGNOSIS — K2970 Gastritis, unspecified, without bleeding: Secondary | ICD-10-CM | POA: Diagnosis not present

## 2017-07-08 DIAGNOSIS — D509 Iron deficiency anemia, unspecified: Secondary | ICD-10-CM | POA: Diagnosis not present

## 2017-07-08 DIAGNOSIS — K921 Melena: Secondary | ICD-10-CM | POA: Diagnosis not present

## 2017-07-08 DIAGNOSIS — K259 Gastric ulcer, unspecified as acute or chronic, without hemorrhage or perforation: Secondary | ICD-10-CM | POA: Diagnosis not present

## 2017-07-08 HISTORY — PX: ESOPHAGOGASTRODUODENOSCOPY: SHX1529

## 2017-07-11 ENCOUNTER — Encounter: Payer: Self-pay | Admitting: Family Medicine

## 2017-08-17 ENCOUNTER — Encounter: Payer: Self-pay | Admitting: Family Medicine

## 2017-08-17 ENCOUNTER — Ambulatory Visit: Payer: 59 | Admitting: Family Medicine

## 2017-08-17 VITALS — BP 109/69 | HR 57 | Temp 98.0°F | Resp 16 | Ht 67.0 in | Wt 166.2 lb

## 2017-08-17 DIAGNOSIS — K2970 Gastritis, unspecified, without bleeding: Secondary | ICD-10-CM

## 2017-08-17 DIAGNOSIS — K922 Gastrointestinal hemorrhage, unspecified: Secondary | ICD-10-CM | POA: Diagnosis not present

## 2017-08-17 DIAGNOSIS — B9681 Helicobacter pylori [H. pylori] as the cause of diseases classified elsewhere: Secondary | ICD-10-CM | POA: Diagnosis not present

## 2017-08-17 DIAGNOSIS — D500 Iron deficiency anemia secondary to blood loss (chronic): Secondary | ICD-10-CM

## 2017-08-17 LAB — CBC WITH DIFFERENTIAL/PLATELET
BASOS PCT: 1.5 % (ref 0.0–3.0)
Basophils Absolute: 0 10*3/uL (ref 0.0–0.1)
EOS PCT: 3.8 % (ref 0.0–5.0)
Eosinophils Absolute: 0.1 10*3/uL (ref 0.0–0.7)
HCT: 36.7 % — ABNORMAL LOW (ref 39.0–52.0)
Hemoglobin: 11.6 g/dL — ABNORMAL LOW (ref 13.0–17.0)
LYMPHS ABS: 1.1 10*3/uL (ref 0.7–4.0)
Lymphocytes Relative: 42.1 % (ref 12.0–46.0)
MCHC: 31.5 g/dL (ref 30.0–36.0)
MCV: 84.5 fl (ref 78.0–100.0)
MONOS PCT: 15.2 % — AB (ref 3.0–12.0)
Monocytes Absolute: 0.4 10*3/uL (ref 0.1–1.0)
NEUTROS PCT: 37.4 % — AB (ref 43.0–77.0)
Neutro Abs: 1 10*3/uL — ABNORMAL LOW (ref 1.4–7.7)
Platelets: 203 10*3/uL (ref 150.0–400.0)
RBC: 4.35 Mil/uL (ref 4.22–5.81)
RDW: 17.6 % — AB (ref 11.5–15.5)
WBC: 2.7 10*3/uL — ABNORMAL LOW (ref 4.0–10.5)

## 2017-08-17 LAB — FERRITIN: Ferritin: 10.3 ng/mL — ABNORMAL LOW (ref 22.0–322.0)

## 2017-08-17 LAB — IRON: Iron: 122 ug/dL (ref 42–165)

## 2017-08-17 NOTE — Progress Notes (Signed)
OFFICE VISIT  08/17/2017   CC:  Chief Complaint  Patient presents with  . Follow-up    Anemia   HPI:    Patient is a 51 y.o. African-American male who presents for f/u iron def anemia: Hb nadir was 9.6 on 07/01/17. All iron studies confirmed deficiency, + pt had recently been having melanotic stool.  This was in the setting of having recently had PCI and was put on DAPT. He was essentially asymptomatic except a mild amount of fatigue when I last saw him. EGD was done 07/08/17 and this showed multiple, non-bleeding superficial gastric ulcerations/erosions, felt to be the source of his GI bleeding and subsequent IDA. His ASA was d/c'd and he was continued on plavix 75mg  qd.  Path of the lesions showed chronic, active H pylori gastritis--he took full regimen of abx for this. He has been taking FeSO4 325mg  qd for about 6 weeks now.  BID dosing=constipation.  Says he feels pretty good. Stool is still dark and not tarry--he describes iron effect on stool. Much improved upper GI sx's.  No f/u with cardiologist is set at this time.  ROS: no fever, no abd pain, no BRBPR, no CP, no SOB, no generalized fatigue, no DOE, no rash, no HAs, no dizziness.  Past Medical History:  Diagnosis Date  . Acute epiglottitis 01/2014  . Anemia 06/2017   Hb drop from 15 down to 11 from 04/2017-06/2017.  Hemoccults and iron testing pending  . BPH with obstruction/lower urinary tract symptoms    [facility] referral done 01/2011 (Dr. Risa Grill); ultimately a prostate biopsy was done for increased PSA velocity--April 2013--and it  was completely normal.  . CAD S/P percutaneous coronary angioplasty 05/05/2017   LEFT HEART CATH AND CORONARY ANGIOGRAPHY -- CORONARY STENT INTERVENTION Conclusion  LESION #1: Prox LAD to Mid LAD lesion, 80 %stenosed -->  A STENT SYNERGY DES 2.75X16 drug eluting stent was successfully placed. - Postdilated and tapered fashion to 3.1-2.9 mm LESION #2: Ramus lesion, 85 %stenosed. A STENT  SYNERGY DES 3X12 drug eluting stent successfully placed. ASA +Plvx x 1 yr, then stop ASA  . Elevated coronary artery calcium score 03/2017   indicating possible 3 vessel CAD: cardiac cath per Dr. Ellyn Hack 8/22 revealed 2 Vessel obstructive CAD in LAD & RI   . Elevated liver function tests    mild  . Erectile dysfunction   . Family history of colon cancer    father  . Family history of hypertension    strong family history  . Family history of premature coronary heart disease    strong family history; Mother (MI in 73s, then 33s, s/p Valve Sgx in 19s) & oldest Brother (CABG x 4 in 57s)  . History of sciatica 11/2013   responded well to prednisone burst 11/2013  . Hx of colonic polyp 2007; 2014   Recall 07/2018 (Dr. Wynetta Emery at [facility])  . Hyperlipidemia    On 40 mg Crestor as of 06/2017  . Hypertension    On telmisartan  . Prediabetes 11/2016   HbA1c 6.1%     Past Surgical History:  Procedure Laterality Date  . COLONOSCOPY W/ POLYPECTOMY  09/02/06; 07/2013   [facility]: adenomatous polyp w/out high grade dysplasia or malignancy.  Recall 07/2018  . coronary calcium score     Very high risk: as of 03/2017 pt is to return to discuss with Dr. Ellyn Hack: stress test vs cath as next step.  . CORONARY STENT INTERVENTION N/A 05/05/2017   DES  x 2. Preserved LV function.  Procedure: CORONARY STENT INTERVENTION;  Surgeon: Leonie Man, MD;  Location: [facility];  Service: Cardiovascular;: CAD S/P PCI: p-mLAD Synergy DES 2.75 x 16 (3.1-2.9 mm), p-mRI Synergy DES 3.0 x 12 (3.3 mm)  . dental implants    . ESOPHAGOGASTRODUODENOSCOPY  07/08/2017   GI RECOMMENDED D/C ASPIRIN:  superfic non-bleeding ulcers ranging 5-32mm in size in antrum.  Bx's done.  A few non-bleeding erosions in pre-pyloric region.  Normal duodenum.  [facility] LEFT HEART CATH AND CORONARY ANGIOGRAPHY N/A 05/05/2017   Procedure: LEFT HEART CATH AND CORONARY ANGIOGRAPHY;  Surgeon: Leonie Man, MD;  Location: [facility];   Service: Cardiovascular;  Laterality: N/A; p-m LAD 80%, mRI 90% --> 2 vessel PCI  . PROSTATE BIOPSY  01/2012   Normal    Outpatient Medications Prior to Visit  Medication Sig Dispense Refill  . clopidogrel (PLAVIX) 75 MG tablet Take 1 tablet (75 mg total) by mouth daily with breakfast. 90 tablet 1  . ferrous sulfate 325 (65 FE) MG tablet Take 1 tablet by mouth daily.    . Multiple Vitamin (MULTIVITAMIN WITH MINERALS) TABS tablet Take 1 tablet by mouth daily.    . nitroGLYCERIN (NITROSTAT) 0.4 MG SL tablet Place 1 tablet (0.4 mg total) under the tongue every 5 (five) minutes as needed for chest pain. 90 tablet 3  . pantoprazole (PROTONIX) 40 MG tablet Take 1 tablet (40 mg total) by mouth daily. 30 tablet 6  . rosuvastatin (CRESTOR) 20 MG tablet Take 1 tablet (20 mg total) by mouth daily. 90 tablet 3  . sildenafil (VIAGRA) 100 MG tablet Take 1 tablet (100 mg total) by mouth daily as needed for erectile dysfunction. 6 tablet 5  . telmisartan (MICARDIS) 40 MG tablet TAKE 1 TABLET BY MOUTH  DAILY 90 tablet 1  . aspirin EC 81 MG tablet Take 81 mg by mouth daily.     No facility-administered medications prior to visit.     Allergies  Allergen Reactions  . Ace Inhibitors Cough  . Shrimp [Shellfish Allergy] Other (See Comments)    Swelling around eyes/lips---can eat IF cooked WELL    ROS As per HPI  PE: Blood pressure 109/69, pulse (!) 57, temperature 98 F (36.7 C), temperature source Oral, resp. rate 16, height 5\' 7"  (1.702 m), weight 166 lb 4 oz (75.4 kg), SpO2 99 %. Gen: Alert, well appearing.  Patient is oriented to person, place, time, and situation. AFFECT: pleasant, lucid thought and speech. GUR:KYHC: no injection, icteris, swelling, or exudate.  EOMI, PERRLA. Mouth: lips without lesion/swelling.  Oral mucosa pink and moist. Oropharynx without erythema, exudate, or swelling.  CV: RRR, no m/r/g.   LUNGS: CTA bilat, nonlabored resps, good aeration in all lung fields. EXT: no  clubbing, cyanosis, or edema.   LABS:  Lab Results  Component Value Date   WBC 3.7 (L) 07/01/2017   HGB 9.6 (L) 07/01/2017   HCT 29.1 (L) 07/01/2017   MCV 93.3 07/01/2017   PLT 301.0 07/01/2017     Chemistry      Component Value Date/Time   NA 139 07/01/2017 1010   NA 138 04/20/2017 0843   K 4.6 07/01/2017 1010   CL 104 07/01/2017 1010   CO2 30 07/01/2017 1010   BUN 19 07/01/2017 1010   BUN 19 04/20/2017 0843   CREATININE 1.35 07/01/2017 1010      Component Value Date/Time   CALCIUM 9.4 07/01/2017 1010   ALKPHOS 50  07/01/2017 1010   AST 28 07/01/2017 1010   ALT 39 07/01/2017 1010   BILITOT 0.4 07/01/2017 1010     Lab Results  Component Value Date   CHOL 147 12/08/2016   HDL 29.80 (L) 12/08/2016   LDLCALC 97 10/23/2015   LDLDIRECT 72.0 12/08/2016   TRIG 220.0 (H) 12/08/2016   CHOLHDL 5 12/08/2016    IMPRESSION AND PLAN:  1) Iron def anemia secondary to upper GI bleeding--chronic gastritis (h pylori). Essentially asymptomatic now. He has completed approp abx for h pylori eradication.  He is unsure about recommended GI f/u, so he'll call his GI office and check into this. Has been on daily iron x 6 weeks--will recheck CBC and iron labs today. Will give hemoccult cards x 3 and he'll do these at home and send them in. If iron/Hb coming up and hemoccults neg, will see what cardiology thinks about getting him back on ASA. For now, continue on plavix only.  An After Visit Summary was printed and given to the patient.  FOLLOW UP: Return in about 3 months (around 11/15/2017) for routine chronic illness f/u.  Signed:  Crissie Sickles, MD           08/17/2017

## 2017-08-19 LAB — IRON AND TIBC
IRON: 112 ug/dL (ref 38–169)
Iron Saturation: 30 % (ref 15–55)
Total Iron Binding Capacity: 368 ug/dL (ref 250–450)
UIBC: 256 ug/dL (ref 111–343)

## 2017-09-18 ENCOUNTER — Other Ambulatory Visit: Payer: Self-pay | Admitting: Family Medicine

## 2017-09-21 ENCOUNTER — Other Ambulatory Visit: Payer: 59

## 2017-09-21 DIAGNOSIS — D500 Iron deficiency anemia secondary to blood loss (chronic): Secondary | ICD-10-CM

## 2017-09-21 LAB — HEMOCCULT SLIDES (X 3 CARDS)
FECAL OCCULT BLD: NEGATIVE
OCCULT 1: NEGATIVE
OCCULT 2: NEGATIVE
OCCULT 3: NEGATIVE
OCCULT 4: NEGATIVE
OCCULT 5: NEGATIVE

## 2017-10-04 ENCOUNTER — Telehealth: Payer: Self-pay | Admitting: Family Medicine

## 2017-10-04 NOTE — Telephone Encounter (Signed)
Copied from [place] [id number]. Topic: Quick Communication - See Telephone Encounter >> Oct 04, 2017 10:34 AM Ether Griffins B wrote: CRM for notification. See Telephone encounter for:  Pt questioning cholesterol medication dosage. He is unsure as to why he was taking the 40 mg and has been reduced to 20 mg after he has had a stint put in and also still has high cholesterol. Pt needing a refill soon so he is needing to know before he orders more medication.  10/04/17.

## 2017-10-04 NOTE — Telephone Encounter (Signed)
SW pt and advised him that he should be taking 20mg  crestor. Pt voiced understanding.

## 2017-11-01 ENCOUNTER — Other Ambulatory Visit: Payer: Self-pay | Admitting: Family Medicine

## 2017-11-01 MED ORDER — CLOPIDOGREL BISULFATE 75 MG PO TABS: 75.0000 mg | ORAL_TABLET | Freq: Every day | ORAL | 1 refills | Status: DC

## 2017-11-01 NOTE — Telephone Encounter (Signed)
Copied from [place]. Topic: Quick Communication - See Telephone Encounter >> Nov 01, 2017 10:09 AM Hewitt Shorts wrote: CRM for notification. See Telephone encounter for: pt is needing a refill on his clopidogrel  [facility] [phone]  best number for pt is [phone] pt only has one left  11/01/17.

## 2017-11-01 NOTE — Telephone Encounter (Signed)
Plavix refill request He has had issues with anemia and GI bleed so referred this to Dr. Anitra Lauth. [facility] [place], [place]

## 2017-11-05 ENCOUNTER — Ambulatory Visit (INDEPENDENT_AMBULATORY_CARE_PROVIDER_SITE_OTHER): Payer: 59 | Admitting: Cardiology

## 2017-11-05 ENCOUNTER — Encounter: Payer: Self-pay | Admitting: Cardiology

## 2017-11-05 VITALS — BP 127/81 | HR 73 | Ht 66.0 in | Wt 164.2 lb

## 2017-11-05 DIAGNOSIS — D500 Iron deficiency anemia secondary to blood loss (chronic): Secondary | ICD-10-CM

## 2017-11-05 DIAGNOSIS — I10 Essential (primary) hypertension: Secondary | ICD-10-CM | POA: Diagnosis not present

## 2017-11-05 DIAGNOSIS — I2510 Atherosclerotic heart disease of native coronary artery without angina pectoris: Secondary | ICD-10-CM | POA: Diagnosis not present

## 2017-11-05 DIAGNOSIS — I209 Angina pectoris, unspecified: Secondary | ICD-10-CM

## 2017-11-05 DIAGNOSIS — E785 Hyperlipidemia, unspecified: Secondary | ICD-10-CM | POA: Diagnosis not present

## 2017-11-05 DIAGNOSIS — Z9861 Coronary angioplasty status: Secondary | ICD-10-CM | POA: Diagnosis not present

## 2017-11-05 DIAGNOSIS — R002 Palpitations: Secondary | ICD-10-CM

## 2017-11-05 DIAGNOSIS — Z8249 Family history of ischemic heart disease and other diseases of the circulatory system: Secondary | ICD-10-CM | POA: Diagnosis not present

## 2017-11-05 NOTE — Patient Instructions (Signed)
Medication Instructions:  Your physician recommends that you continue on your current medications as directed. Please refer to the Current Medication list given to you today.  Follow-Up: Your physician wants you to follow-up in: August/September with Dr. Ellyn Hack. You will receive a reminder letter in the mail two months in advance. If you don't receive a letter, please call our office to schedule the follow-up appointment.   Any Other Special Instructions Will Be Listed Below (If Applicable).     If you need a refill on your cardiac medications before your next appointment, please call your pharmacy.

## 2017-11-05 NOTE — Progress Notes (Signed)
PCP: Tammi Sou, MD  Clinic Note: Chief Complaint  Patient presents with  . Follow-up    Feeling well.  No angina.  Energy better.  . Coronary Artery Disease    LAD and RI PCI  . Anemia    Related to GI bleed, now aspirin held.  Stable hemoglobin.    HPI: Thomas Spencer is a 52 y.o. male who is being seen today for 76-month follow-up for CAD -PCI and anemia  Damyan was initially seen in consultation for exertional dyspnea and occasional chest discomfort at the request of Tammi Sou, MD back in early July 2017 --> noted a significant exertional dyspnea climbing stairs with heart racing/fluttering.  -->  Initial evaluation was with coronary calcium score suggesting 3 vessel disease.  When I saw him in follow-up he had worsening symptoms which were much more consistent with exertional angina.  -->  He was then taken to the cardiac catheterization lab -->  Noted to have LAD & RI disease - 2 V PCI.    Harlow Carrizales was last seen on June 29, 2017 --was not yet noticing overall improved symptoms beyond no angina since his PCI.  Was still being troubled with anemia and fatigue. -->  Subsequently seen by Dr. Erlene Quan ([facility]) --> EGD.  Finally had GI evaluation and found to have peptic ulcer disease treated with Flagyl, PPI etc.  Aspirin was held  Recent Hospitalizations: Overnight observation following cardiac catheterization and PCI.  Studies Personally Reviewed - (if available, images/films reviewed: From Epic Chart or Care Everywhere) Lab Results  Component Value Date   HGB 11.6 (L) 08/17/2017   EGD from July 08, 2017 [facility] [address] - Dr. Erlene Quan) multiple, non-bleeding superficial gastric ulcerations/erosions, felt to be the source of his GI bleeding and subsequent IDA.   His ASA was d/c'd and he was continued on plavix 75mg  qd. Path of the lesions showed chronic, active H pylori gastritis--he took full regimen of abx.  Has been on iron supplementation since  then   Interval History: Orpheus returns today finally starting to feel better.  His energy level is notably improved.  He is not having any more dark tarry stools to suggest GI bleed.  His CBCs have been stable.  He is now back to a more stable level of exercise.  He is quite happy with an anginal symptoms or dyspnea with rest or exertion.  No fatigue or exercise intolerance.  He is finally happy to be noticing the benefit from his PCI.  He is not having any heart failure symptoms of PND, orthopnea or edema.  No rapid irregular heartbeats or palpitations.  No syncope/near syncope or TIA/amaurosis fugax.  No melena, hematochezia, hematemesis or epistaxis.  Also no hematuria or easy bleeding/bruising.  No claudication.  ROS: A comprehensive was performed. Review of Systems  Constitutional: Negative for malaise/fatigue (Energy level noatlbly improved).  HENT: Negative for congestion and nosebleeds.   Respiratory: Positive for cough (dry cough). Negative for shortness of breath and wheezing.   Gastrointestinal: Negative for abdominal pain, blood in stool, constipation and diarrhea.  Genitourinary: Negative for hematuria.  Musculoskeletal: Negative for joint pain and myalgias.  Neurological: Negative for dizziness.  Endo/Heme/Allergies: Does not bruise/bleed easily.  All other systems reviewed and are negative.   I have reviewed and (if needed) personally updated the patient's problem list, medications, allergies, past medical and surgical history, social and family history.   Past Medical History:  Diagnosis Date  . Acute epiglottitis 01/2014  .  Anemia 06/2017   Hb drop from 15 down to 11 from 04/2017-06/2017.  Hemoccults and iron testing pending  . BPH with obstruction/lower urinary tract symptoms    [facility] referral done 01/2011 (Dr. Risa Grill); ultimately a prostate biopsy was done for increased PSA velocity--April 2013--and it  was completely normal.  . CAD S/P percutaneous  coronary angioplasty 05/05/2017   Progressive Angina: (3 V CAD on Cor Calcium Score) --> LEFT HEART CATH & CORONARY ANGIOGRAPHY  / CORONARY STENT INTERVENTION Conclusion  LESION #1: p-mLAD to Mid LAD 80 %s --> PCI SYNERGY DES 2.75X16 - Tapered post-dilation: 3.1-2.9 mm.  LESION #2: pRI 85 % - PCI SYNERGY DES 3X12.  Initial plan was ASA/Plavix x 1 yr  --> ASA stopped 06/2017 for GI Bleed, on Plavix alone   . Elevated coronary artery calcium score 03/2017   indicating possible 3 vessel CAD: cardiac cath per Dr. Ellyn Hack 8/22 revealed 2 Vessel obstructive CAD in LAD & RI   . Elevated liver function tests    mild  . Erectile dysfunction   . Family history of colon cancer    father  . Family history of hypertension    strong family history  . Family history of premature coronary heart disease    strong family history; Mother (MI in 92s, then 61s, s/p Valve Sgx in 72s) & oldest Brother (CABG x 4 in 74s)  . History of sciatica 11/2013   responded well to prednisone burst 11/2013  . Hx of colonic polyp 2007; 2014   Recall 07/2018 (Dr. Wynetta Emery at [facility])  . Hyperlipidemia    On 40 mg Crestor as of 06/2017  . Hypertension    On telmisartan  . Prediabetes 11/2016   HbA1c 6.1%     Past Surgical History:  Procedure Laterality Date  . COLONOSCOPY W/ POLYPECTOMY  09/02/06; 07/2013   [facility]: adenomatous polyp w/out high grade dysplasia or malignancy.  Recall 07/2018  . coronary calcium score     Very high risk: as of 03/2017 pt is to return to discuss with Dr. Ellyn Hack: stress test vs cath as next step.  . CORONARY STENT INTERVENTION N/A 05/05/2017   DES x 2. Preserved LV function.  Procedure: CORONARY STENT INTERVENTION;  Surgeon: Leonie Man, MD;  Location: [facility];  Service: Cardiovascular;: CAD S/P PCI: p-mLAD Synergy DES 2.75 x 16 (3.1-2.9 mm), p-mRI Synergy DES 3.0 x 12 (3.3 mm)  . dental implants    . ESOPHAGOGASTRODUODENOSCOPY  07/08/2017   GI RECOMMENDED D/C ASPIRIN:   superfic non-bleeding ulcers ranging 5-61mm in size in antrum.  Bx's done.  A few non-bleeding erosions in pre-pyloric region.  Normal duodenum.  Marland Kitchen LEFT HEART CATH AND CORONARY ANGIOGRAPHY N/A 05/05/2017   Procedure: LEFT HEART CATH AND CORONARY ANGIOGRAPHY;  Surgeon: Leonie Man, MD;  Location: [facility];  Service: Cardiovascular;  Laterality: N/A; p-m LAD 80%, mRI 90% --> 2 vessel PCI  . PROSTATE BIOPSY  01/2012   Normal    Diagnostic Diagram 05/05/2017    Post-Intervention Diagram        PCI LAD Synergy DES: 2.75x 16 (~3.0); PCI RI Synergy 3.0 x 12 (3.3 mm)                Current Meds  Medication Sig  . aspirin EC 81 MG tablet Take 81 mg by mouth daily.  . clopidogrel (PLAVIX) 75 MG tablet Take 1 tablet (75 mg total) by mouth daily with breakfast.  .  ferrous sulfate 325 (65 FE) MG tablet Take 1 tablet by mouth daily.  . Multiple Vitamin (MULTIVITAMIN WITH MINERALS) TABS tablet Take 1 tablet by mouth daily.  . nitroGLYCERIN (NITROSTAT) 0.4 MG SL tablet Place 1 tablet (0.4 mg total) under the tongue every 5 (five) minutes as needed for chest pain.  . pantoprazole (PROTONIX) 40 MG tablet Take 1 tablet (40 mg total) by mouth daily.  . sildenafil (VIAGRA) 100 MG tablet Take 1 tablet (100 mg total) by mouth daily as needed for erectile dysfunction.  Marland Kitchen telmisartan (MICARDIS) 40 MG tablet TAKE 1 TABLET BY MOUTH  DAILY    Allergies  Allergen Reactions  . Ace Inhibitors Cough  . Shrimp [Shellfish Allergy] Other (See Comments)    Swelling around eyes/lips---can eat IF cooked WELL    Social History   Socioeconomic History  . Marital status: Married    Spouse name: None  . Number of children: 2  . Years of education: None  . Highest education level: None  Social Needs  . Financial resource strain: None  . Food insecurity - worry: None  . Food insecurity - inability: None  . Transportation needs - medical: None  . Transportation needs - non-medical: None  Occupational  History  . Occupation: Personal assistant    Comment: [employer].  Tobacco Use  . Smoking status: Never Smoker  . Smokeless tobacco: Never Used  Substance and Sexual Activity  . Alcohol use: Yes    Alcohol/week: 1.2 oz    Types: 2 Glasses of wine per week  . Drug use: No  . Sexual activity: Yes    Partners: Female  Other Topics Concern  . None  Social History Narrative   SH: Married, two teenagers (1 boy and 1 girl) as of 10/2015.   He is a Personal assistant, lives in [place], [place].   He has a PhD in a vertical engineering any [employer] in intellectual property   No Tobacco, rare ETOH, no drug use.   Exercise occasionally - most days the week he goes to the gym.          family history includes Cancer in his father and sister; Coronary artery disease (age of onset: 28) in his brother; Diabetes in his sister and sister; Heart attack (age of onset: 32) in his mother; Heart disease (age of onset: 41) in his mother; Hyperlipidemia in his brother and mother; Hypertension in his brother, mother, sister, and sister; Valvular heart disease (age of onset: 39) in his mother.  Wt Readings from Last 3 Encounters:  11/05/17 164 lb 3.2 oz (74.5 kg)  08/17/17 166 lb 4 oz (75.4 kg)  07/01/17 162 lb (73.5 kg)    PHYSICAL EXAM BP 127/81   Pulse 73   Ht 5\' 6"  (1.676 m)   Wt 164 lb 3.2 oz (74.5 kg)   SpO2 98%   BMI 26.50 kg/m  Physical Exam  Constitutional: He is oriented to person, place, and time. He appears well-developed and well-nourished. No distress.  Well-groomed.  Healthy-appearing  HENT:  Head: Normocephalic and atraumatic.  Eyes: EOM are normal. No scleral icterus (No conjunctival pallor).  Neck: Trachea normal. No hepatojugular reflux and no JVD present. Carotid bruit is not present.  Cardiovascular: Normal rate, regular rhythm, normal heart sounds and intact distal pulses.  No extrasystoles are present. PMI is not displaced. Exam reveals no gallop and no friction rub.  No murmur  heard. Pulmonary/Chest: Breath sounds normal. No respiratory distress. He has no  wheezes. He has no rales.  Abdominal: Soft. Bowel sounds are normal. He exhibits no distension. There is no tenderness. There is no rebound.  Musculoskeletal: Normal range of motion. He exhibits no edema.  Right radial cath site clean dry and intact. Cannot even see the access site  Neurological: He is alert and oriented to person, place, and time.  Skin: Skin is warm and dry. No rash noted. No erythema. No pallor.  Psychiatric: He has a normal mood and affect. His behavior is normal. Judgment and thought content normal.  Nursing note and vitals reviewed.    Adult ECG Report n/a  Other studies Reviewed: Additional studies/ records that were reviewed today include:  Recent Labs:  Due for annual labs Lab Results  Component Value Date   CHOL 147 12/08/2016   HDL 29.80 (L) 12/08/2016   LDLCALC 97 10/23/2015   LDLDIRECT 72.0 12/08/2016   TRIG 220.0 (H) 12/08/2016   CHOLHDL 5 12/08/2016   Lab Results  Component Value Date   CREATININE 1.35 07/01/2017   BUN 19 07/01/2017   NA 139 07/01/2017   K 4.6 07/01/2017   CL 104 07/01/2017   CO2 30 07/01/2017   Lab Results  Component Value Date   WBC 2.7 (L) 08/17/2017   HGB 11.6 (L) 08/17/2017   HCT 36.7 (L) 08/17/2017   MCV 84.5 08/17/2017   PLT 203.0 08/17/2017   ASSESSMENT / PLAN: Problem List Items Addressed This Visit    Anemia due to blood loss    Normocytic anemia likely related to GI bleed thought to be from peptic ulcer disease.  He is still on Protonix and is now on iron supplementation.  We have stopped aspirin and continue Plavix.  Still followed by PCP.  Last blood counts showed improved hemoglobin up to 11.6.  He is due for a follow-up check as part of his annual lab follow-up.      Angina, class III (HCC) (Chronic)    Essentially now resolved post PCI.      CAD S/P PCI: p-mLAD Synergy DES 2.75 x 16 (3.1-2.9 mm), p-mRI Synergy DES 3.0  x 12 (3.3 mm) - Primary (Chronic)    PCI to LAD and ramus intermedius with DES stents.  He is now over 6 months out and is probably safe staying on Plavix alone.  Based on where his stents are, I would like to try to keep him on Plavix as long as his hemoglobin levels stay stable. I did not add a beta-blocker during his last visit because of fatigue.  His blood pressure is relatively stable and his heart rate is 73.  If his blood pressure were to increase, would probably start low-dose beta-blocker.  Otherwise simply continue statin and ARB.      Essential hypertension (Chronic)    Well-controlled on ARB.  I would like to have him on a beta-blocker, but did not start because of fatigue.  We can consider beta-blocker down the road if his blood pressure and heart rate are stable and will tolerate.      Family history of premature coronary heart disease    Very strong family history which he now is a part of.  He indicates that he is using less since he has learned to talk to his other siblings about their risks.  Several of them are already in the process of being evaluated.      Fluttering sensation of heart    No longer present post PCI.  Hyperlipidemia with target low density lipoprotein (LDL) cholesterol less than 70 mg/dL (Chronic)    Tolerating Crestor.  Has not had labs checked since March of last year.  Is due for follow-up now as part of his annual lab evaluation with his PCP.  Low threshold for titrating Crestor up to 40 mg if not at goal.          Current medicines are reviewed at length with the patient today. (+/- concerns) None The following changes have been made: None  Patient Instructions  Medication Instructions:  Your physician recommends that you continue on your current medications as directed. Please refer to the Current Medication list given to you today.  Follow-Up: Your physician wants you to follow-up in: August/September with Dr. Ellyn Hack. You will  receive a reminder letter in the mail two months in advance. If you don't receive a letter, please call our office to schedule the follow-up appointment.   Any Other Special Instructions Will Be Listed Below (If Applicable).     If you need a refill on your cardiac medications before your next appointment, please call your pharmacy.     Studies Ordered:   No orders of the defined types were placed in this encounter.     Glenetta Hew, M.D., M.S. Interventional Cardiologist   Pager # [phone] Phone # [phone] [address] [place], [place] [id number]

## 2017-11-07 ENCOUNTER — Encounter: Payer: Self-pay | Admitting: Cardiology

## 2017-11-08 ENCOUNTER — Encounter: Payer: Self-pay | Admitting: Cardiology

## 2017-11-08 NOTE — Assessment & Plan Note (Signed)
Essentially now resolved post PCI.

## 2017-11-08 NOTE — Assessment & Plan Note (Signed)
Normocytic anemia likely related to GI bleed thought to be from peptic ulcer disease.  He is still on Protonix and is now on iron supplementation.  We have stopped aspirin and continue Plavix.  Still followed by PCP.  Last blood counts showed improved hemoglobin up to 11.6.  He is due for a follow-up check as part of his annual lab follow-up.

## 2017-11-08 NOTE — Assessment & Plan Note (Signed)
Tolerating Crestor.  Has not had labs checked since March of last year.  Is due for follow-up now as part of his annual lab evaluation with his PCP.  Low threshold for titrating Crestor up to 40 mg if not at goal.

## 2017-11-08 NOTE — Assessment & Plan Note (Signed)
PCI to LAD and ramus intermedius with DES stents.  He is now over 6 months out and is probably safe staying on Plavix alone.  Based on where his stents are, I would like to try to keep him on Plavix as long as his hemoglobin levels stay stable. I did not add a beta-blocker during his last visit because of fatigue.  His blood pressure is relatively stable and his heart rate is 73.  If his blood pressure were to increase, would probably start low-dose beta-blocker.  Otherwise simply continue statin and ARB.

## 2017-11-08 NOTE — Assessment & Plan Note (Signed)
Very strong family history which he now is a part of.  He indicates that he is using less since he has learned to talk to his other siblings about their risks.  Several of them are already in the process of being evaluated.

## 2017-11-08 NOTE — Assessment & Plan Note (Signed)
No longer present post PCI.

## 2017-11-08 NOTE — Assessment & Plan Note (Signed)
Well-controlled on ARB.  I would like to have him on a beta-blocker, but did not start because of fatigue.  We can consider beta-blocker down the road if his blood pressure and heart rate are stable and will tolerate.

## 2017-11-13 ENCOUNTER — Encounter: Payer: Self-pay | Admitting: Family Medicine

## 2017-12-21 ENCOUNTER — Telehealth: Payer: Self-pay | Admitting: Cardiology

## 2017-12-21 NOTE — Telephone Encounter (Signed)
New Message    Patient c/o Palpitations:  High priority if patient c/o lightheadedness, shortness of breath, or chest pain  1) How long have you had palpitations/irregular HR/ Afib? Are you having the symptoms now? Patient states that he has the heart fluttering for about 3 weeks   2) Are you currently experiencing lightheadedness, SOB or CP? Lightheadness, mild pinching sensation when he lays on his right side   3) Do you have a history of afib (atrial fibrillation) or irregular heart rhythm? No   4) Have you checked your BP or HR? (document readings if available): BP 130/93 (this mornings reading)  5) Are you experiencing any other symptoms?  He says that he was not able to stand up this morning.

## 2017-12-21 NOTE — Telephone Encounter (Signed)
Returned call to patient of Dr. Ellyn Hack who was last seen Feb 2019. This AM he felt like the room was spinning, felt nauseous, couldn't stand up, had to crawl to bathroom b/c he felt like he was going to be sick. When he was finally able to stand up, he went outside b/c he felt he needed some cool air. He tried to lay back down, and as soon as he did he had the "spinning" again. He was eventually able to to lay on either side but not supine.   He also c/o palpitations for about 3 weeks. He also has lightheadedness, mild pinching sensation on right side. He cannot state if these are correlated to palps. The palpitations are very random. Denies chest pain and shortness of breath.   BP was 130/92 this AM (a little higher than his normal).   He is now at work, felt OK to drive.   He has family history of vertigo.   Will route to MD to see if this needs further work up from cardiac standpoint.

## 2017-12-22 NOTE — Telephone Encounter (Signed)
The episode yesterday seems relatively consistent with possibly vertigo.  It could be that his blood pressure was low, but he indicated that his check was 130/90 which would not explain those symptoms.  The spinning concept seems quite consistent with vertigo.  Key way to avoid any orthostatic dizziness associated with this would be to make sure that he is drinking plenty of water. As for the palpitations, if they are worrisome to him, we can probably discuss low-dose beta-blocker either in addition to oral removal of ARB.  Let us just see how frequent the palpitations are and how worrisome they are to him.  Glenetta Hew, MD

## 2017-12-22 NOTE — Telephone Encounter (Signed)
Left detailed message with MD recommendations concerning room spinning - possibly vertigo, stay hydrated, likley not BP related since BP was normal and also concerning palpitations - monitor for frequency & any symptoms associated with them, possible med changes if occurring more often/bothersome

## 2017-12-28 ENCOUNTER — Ambulatory Visit: Payer: Self-pay | Admitting: *Deleted

## 2017-12-28 ENCOUNTER — Encounter: Payer: Self-pay | Admitting: Family Medicine

## 2017-12-28 ENCOUNTER — Ambulatory Visit (INDEPENDENT_AMBULATORY_CARE_PROVIDER_SITE_OTHER): Payer: 59 | Admitting: Family Medicine

## 2017-12-28 VITALS — BP 134/91 | HR 80 | Temp 98.0°F | Ht 66.0 in | Wt 166.0 lb

## 2017-12-28 DIAGNOSIS — D509 Iron deficiency anemia, unspecified: Secondary | ICD-10-CM

## 2017-12-28 DIAGNOSIS — R42 Dizziness and giddiness: Secondary | ICD-10-CM

## 2017-12-28 LAB — CBC WITH DIFFERENTIAL/PLATELET
BASOS ABS: 0 10*3/uL (ref 0.0–0.1)
BASOS PCT: 1.2 % (ref 0.0–3.0)
Eosinophils Absolute: 0.1 10*3/uL (ref 0.0–0.7)
Eosinophils Relative: 3.1 % (ref 0.0–5.0)
HEMATOCRIT: 42.3 % (ref 39.0–52.0)
Hemoglobin: 14.2 g/dL (ref 13.0–17.0)
Lymphocytes Relative: 35.7 % (ref 12.0–46.0)
Lymphs Abs: 1.2 10*3/uL (ref 0.7–4.0)
MCHC: 33.6 g/dL (ref 30.0–36.0)
MCV: 85.1 fl (ref 78.0–100.0)
MONO ABS: 0.4 10*3/uL (ref 0.1–1.0)
Monocytes Relative: 11.9 % (ref 3.0–12.0)
NEUTROS PCT: 48.1 % (ref 43.0–77.0)
Neutro Abs: 1.6 10*3/uL (ref 1.4–7.7)
PLATELETS: 160 10*3/uL (ref 150.0–400.0)
RBC: 4.97 Mil/uL (ref 4.22–5.81)
RDW: 18.3 % — ABNORMAL HIGH (ref 11.5–15.5)
WBC: 3.4 10*3/uL — AB (ref 4.0–10.5)

## 2017-12-28 MED ORDER — METHYLPREDNISOLONE ACETATE 80 MG/ML IJ SUSP
80.0000 mg | Freq: Once | INTRAMUSCULAR | Status: AC
  Administered 2017-12-28: 80 mg via INTRAMUSCULAR

## 2017-12-28 MED ORDER — MECLIZINE HCL 25 MG PO TABS: 25.0000 mg | ORAL_TABLET | Freq: Three times a day (TID) | ORAL | 0 refills | Status: DC | PRN

## 2017-12-28 NOTE — Progress Notes (Signed)
Thomas Spencer , Dec 07, 1965, 52 y.o., male MRN: [id number] Patient Care Team    Relationship Specialty Notifications Start End  McGowen, Adrian Blackwater, MD PCP - General Family Medicine  03/27/11   Garlan Fair, MD Consulting Physician Gastroenterology  11/05/11   Rana Snare, MD Consulting Physician Urology  07/03/14   Leonie Man, MD Consulting Physician Cardiology  03/21/17   Breck Coons, MD Consulting Physician Gastroenterology  07/11/17    Comment: [facility]    Chief Complaint  Patient presents with  . Dizziness    Pt c/o feeling of the room spinning X 1 WEEK.pt also c/o nausea and feeling sick in his stomach. he claim he check his bp its about 150/90 when the episode started 1 week ago.     Subjective: Pt presents for an OV with complaints of vertigo symptoms. He has had 2 episodes. He has never had episodes of vertigo before last week. he has a h/o anemia, HTN, angina, CAD s/p stent. The first event  occurred 1 weeks ago, in the morning when he hit the snooze button and laid his head back straight on his pillow. He felt the room started spinning to the LEFT. This startled him and he rolled out of bed, which made his symptoms worse and he was unable to walk, so he crawled to the bathroom. He states that event last 5 minutes. He does endorse feeling like he was going to failnt and had an odd "mild fogginess" feeling in his head after.  He does endorse the symptoms worsened with turning head to the left> Right. The second event occurred this morning when he turned his head to the left. He reports this episode did not last as long and was not as "intense". He reports the spinning stopped when he turned his head to right. He recorded (his daughter helped) his eyes during the second event, which showed mild left/right horizontal movement. He denies association to Viagra or nitro  use. He denies recent known illness/URI, fever, chills, nausea or vomit.  He has iron  deficiency. He has not been taking his iron supplement in 2 months. He denies changes in bowel habits or blood per rectum. He is on plaxix only currently. He has had a h/o of GI bleeding with dual ASA and plavix use.  Pt has tried nothing to ease their symptoms.   No flowsheet data found.  Allergies  Allergen Reactions  . Ace Inhibitors Cough  . Shrimp [Shellfish Allergy] Other (See Comments)    Swelling around eyes/lips---can eat IF cooked WELL   Social History   Tobacco Use  . Smoking status: Never Smoker  . Smokeless tobacco: Never Used  Substance Use Topics  . Alcohol use: Yes    Alcohol/week: 1.2 oz    Types: 2 Glasses of wine per week   Past Medical History:  Diagnosis Date  . Acute epiglottitis 01/2014  . Anemia 06/2017   Hb drop from 15 down to 11 from 04/2017-06/2017.  Hemoccults and iron testing pending  . BPH with obstruction/lower urinary tract symptoms    [facility] referral done 01/2011 (Dr. Risa Grill); ultimately a prostate biopsy was done for increased PSA velocity--April 2013--and it  was completely normal.  . CAD S/P percutaneous coronary angioplasty 05/05/2017   Progressive Angina: (3 V CAD on Cor Calcium Score) --> LEFT HEART CATH & CORONARY ANGIOGRAPHY  / CORONARY STENT INTERVENTION Conclusion  LESION #1: p-mLAD to Mid LAD 80 %s --> PCI SYNERGY  DES 2.75X16 - Tapered post-dilation: 3.1-2.9 mm.  LESION #2: pRI 85 % - PCI SYNERGY DES 3X12.  Initial plan ASA/Plavix x 1 yr  --> ASA stopped 06/2017 for GI Bleed-- on Plavix alone and continued w/this 11/2017  . Elevated coronary artery calcium score 03/2017   indicating possible 3 vessel CAD: cardiac cath per Dr. Ellyn Hack 8/22 revealed 2 Vessel obstructive CAD in LAD & RI   . Elevated liver function tests    mild  . Erectile dysfunction   . Family history of colon cancer    father  . Family history of hypertension    strong family history  . Family history of premature coronary heart disease    strong family  history; Mother (MI in 51s, then 88s, s/p Valve Sgx in 45s) & oldest Brother (CABG x 4 in 97s)  . History of sciatica 11/2013   responded well to prednisone burst 11/2013  . Hx of colonic polyp 2007; 2014   Recall 07/2018 (Dr. Wynetta Emery at [facility])  . Hyperlipidemia    On 40 mg Crestor as of 06/2017  . Hypertension    On telmisartan  . Prediabetes 11/2016   HbA1c 6.1%    Past Surgical History:  Procedure Laterality Date  . COLONOSCOPY W/ POLYPECTOMY  09/02/06; 07/2013   [facility]: adenomatous polyp w/out high grade dysplasia or malignancy.  Recall 07/2018  . coronary calcium score     Very high risk: as of 03/2017 pt is to return to discuss with Dr. Ellyn Hack: stress test vs cath as next step.  . CORONARY STENT INTERVENTION N/A 05/05/2017   DES x 2. Preserved LV function.  Procedure: CORONARY STENT INTERVENTION;  Surgeon: Leonie Man, MD;  Location: [facility];  Service: Cardiovascular;: CAD S/P PCI: p-mLAD Synergy DES 2.75 x 16 (3.1-2.9 mm), p-mRI Synergy DES 3.0 x 12 (3.3 mm)  . dental implants    . ESOPHAGOGASTRODUODENOSCOPY  07/08/2017   GI RECOMMENDED D/C ASPIRIN:  superfic non-bleeding ulcers ranging 5-33mm in size in antrum.  Bx's done.  A few non-bleeding erosions in pre-pyloric region.  Normal duodenum.  [facility] Kitchen LEFT HEART CATH AND CORONARY ANGIOGRAPHY N/A 05/05/2017   Procedure: LEFT HEART CATH AND CORONARY ANGIOGRAPHY;  Surgeon: Leonie Man, MD;  Location: [facility];  Service: Cardiovascular;  Laterality: N/A; p-m LAD 80%, mRI 90% --> 2 vessel PCI  . PROSTATE BIOPSY  01/2012   Normal   Family History  Problem Relation Age of Onset  . Heart attack Mother 78  . Heart disease Mother 67  . Hyperlipidemia Mother   . Hypertension Mother   . Valvular heart disease Mother 16       s/p MVR-CABG  . Cancer Father        colon  . Cancer Sister        cervical/ remission  . Hyperlipidemia Brother   . Hypertension Brother   . Coronary artery disease Brother 7        CABG x 4  . Diabetes Sister        type 2  . Hypertension Sister   . Hypertension Sister   . Diabetes Sister        type 2   Allergies as of 12/28/2017      Reactions   Ace Inhibitors Cough   Shrimp [shellfish Allergy] Other (See Comments)   Swelling around eyes/lips---can eat IF cooked WELL      Medication List  Accurate as of 12/28/17 11:59 PM. Always use your most recent med list.          aspirin EC 81 MG tablet Take 81 mg by mouth daily.   clopidogrel 75 MG tablet Commonly known as:  PLAVIX Take 1 tablet (75 mg total) by mouth daily with breakfast.   ferrous sulfate 325 (65 FE) MG tablet Take 1 tablet by mouth daily.   meclizine 25 MG tablet Commonly known as:  ANTIVERT Take 1 tablet (25 mg total) by mouth 3 (three) times daily as needed for dizziness.   multivitamin with minerals Tabs tablet Take 1 tablet by mouth daily.   nitroGLYCERIN 0.4 MG SL tablet Commonly known as:  NITROSTAT Place 1 tablet (0.4 mg total) under the tongue every 5 (five) minutes as needed for chest pain.   pantoprazole 40 MG tablet Commonly known as:  PROTONIX Take 1 tablet (40 mg total) by mouth daily.   rosuvastatin 20 MG tablet Commonly known as:  CRESTOR Take 1 tablet (20 mg total) by mouth daily.   sildenafil 100 MG tablet Commonly known as:  VIAGRA Take 1 tablet (100 mg total) by mouth daily as needed for erectile dysfunction.   telmisartan 40 MG tablet Commonly known as:  MICARDIS TAKE 1 TABLET BY MOUTH  DAILY       All past medical history, surgical history, allergies, family history, immunizations andmedications were updated in the EMR today and reviewed under the history and medication portions of their EMR.     ROS: Negative, with the exception of above mentioned in HPI   Objective:  BP (!) 134/91 (BP Location: Left Arm, Cuff Size: Normal)   Pulse 80   Temp 98 F (36.7 C) (Oral)   Ht 5\' 6"  (1.676 m)   Wt 166 lb (75.3 kg)   SpO2 97%   BMI 26.79  kg/m  Body mass index is 26.79 kg/m. Gen: Afebrile. No acute distress. Nontoxic in appearance, well developed, well nourished. Very pleasant AAM. HENT: AT. [place]. Bilateral TM visualized with mild erythema and fullness left TM compared to right. MMM, no oral lesions. Bilateral nares without erythema, swelling or drainage. Throat without erythema or exudates. No cough.  Eyes:Pupils Equal Round Reactive to light, Extraocular movements intact,  Conjunctiva without redness, discharge or icterus. Neck/lymp/endocrine: Supple,no lymphadenopathy CV: RRR, no murmur. no edema Chest: CTAB, no wheeze or crackles.  Neuro:  Normal gait. PERLA. EOMi. Alert. Oriented x3 Cranial nerves II through XII intact. Muscle strength 5/5 all 4 extremity. Unable to reproduce symptoms with dix-hallpike today.  Psych: Normal affect, dress and demeanor. Normal speech. Normal thought content and judgment.  No exam data present No results found. Results for orders placed or performed in visit on 12/28/17 (from the past 24 hour(s))  CBC w/Diff     Status: Abnormal   Collection Time: 12/28/17  2:22 PM  Result Value Ref Range   WBC 3.4 (L) 4.0 - 10.5 K/uL   RBC 4.97 4.22 - 5.81 Mil/uL   Hemoglobin 14.2 13.0 - 17.0 g/dL   HCT 42.3 39.0 - 52.0 %   MCV 85.1 78.0 - 100.0 fl   MCHC 33.6 30.0 - 36.0 g/dL   RDW 18.3 (H) 11.5 - 15.5 %   Platelets 160.0 150.0 - 400.0 K/uL   Neutrophils Relative % 48.1 43.0 - 77.0 %   Lymphocytes Relative 35.7 12.0 - 46.0 %   Monocytes Relative 11.9 3.0 - 12.0 %   Eosinophils Relative 3.1 0.0 - 5.0 %  Basophils Relative 1.2 0.0 - 3.0 %   Neutro Abs 1.6 1.4 - 7.7 K/uL   Lymphs Abs 1.2 0.7 - 4.0 K/uL   Monocytes Absolute 0.4 0.1 - 1.0 K/uL   Eosinophils Absolute 0.1 0.0 - 0.7 K/uL   Basophils Absolute 0.0 0.0 - 0.1 K/uL  Iron, TIBC and Ferritin Panel     Status: Abnormal   Collection Time: 12/28/17  2:22 PM  Result Value Ref Range   Iron 32 (L) 50 - 180 mcg/dL   TIBC 371 250 - 425 mcg/dL  (calc)   %SAT 9 (L) 15 - 60 % (calc)   Ferritin 13 (L) 20 - 380 ng/mL  EXTRA LAV TOP TUBE     Status: None   Collection Time: 12/28/17  2:22 PM  Result Value Ref Range   EXTRA LAVENDER-TOP TUBE      Assessment/Plan: Jaice Digioia is a 53 y.o. male present for OV for  Vertigo - VSS, orthostatics negative. Discussed with patient his symptoms sound consistent with vertigo. The "fogginess" experienced after is mildly concerning, and has self resolved.  - Discussed BPPV and labyrinthitis as potential causes. IM depo med to help if labyrinthitis as cause. Tried to avoid oral steroids with h/o of GI bleed and plavix.  - Discussed emergent symptoms for stroke/TIA.  - Normal orthostatics reassuring, however CBC, FOBT and iron panel ordered to be complete.  - HYDRATE, symptoms can be exacerbated if dehydrated  - Epley maneuver instructions provided on AVS. - Meclizine provided if symptoms increase in frequency or duration.  - Hemoccult Cards (X3 cards); Future - CBC w/Diff - meclizine (ANTIVERT) 25 MG tablet; Take 1 tablet (25 mg total) by mouth 3 (three) times daily as needed for dizziness.  Dispense: 30 tablet; Refill: 0 - methylPREDNISolone acetate (DEPO-MEDROL) injection 80 mg - f/u 2 weeks if not improving, sooner if worsening (w/PCP)  Iron deficiency anemia, unspecified iron deficiency anemia type - he denies any bowel changes or BPR, however will test to be complete since he stopped iron supplement.   - Hemoccult Cards (X3 cards); Future (sent home with cards to complete and send back) - CBC w/Diff - Iron, TIBC and Ferritin Panel   Reviewed expectations re: course of current medical issues.  Discussed self-management of symptoms.  Outlined signs and symptoms indicating need for more acute intervention.  Patient verbalized understanding and all questions were answered.  Patient received an After-Visit Summary.    Orders Placed This Encounter  Procedures  . Hemoccult Cards  (X3 cards)  . CBC w/Diff  . Iron, TIBC and Ferritin Panel  . EXTRA LAV TOP TUBE     Note is dictated utilizing voice recognition software. Although note has been proof read prior to signing, occasional typographical errors still can be missed. If any questions arise, please do not hesitate to call for verification.   electronically signed by:  Howard Pouch, DO  [place]

## 2017-12-28 NOTE — Patient Instructions (Signed)
flonase start.  Steroid shot today.  Meclizine (antivert) --> as needed. Would not advise if just lasting a couple minutes and not frequent.  HYDRATE!!!  We call you lab results.    If an emergent signs we spoke of develop, please go to ED.     Benign Positional Vertigo Vertigo is the feeling that you or your surroundings are moving when they are not. Benign positional vertigo is the most common form of vertigo. The cause of this condition is not serious (is benign). This condition is triggered by certain movements and positions (is positional). This condition can be dangerous if it occurs while you are doing something that could endanger you or others, such as driving. What are the causes? In many cases, the cause of this condition is not known. It may be caused by a disturbance in an area of the inner ear that helps your brain to sense movement and balance. This disturbance can be caused by a viral infection (labyrinthitis), head injury, or repetitive motion. What increases the risk? This condition is more likely to develop in:  Women.  People who are 105 years of age or older.  What are the signs or symptoms? Symptoms of this condition usually happen when you move your head or your eyes in different directions. Symptoms may start suddenly, and they usually last for less than a minute. Symptoms may include:  Loss of balance and falling.  Feeling like you are spinning or moving.  Feeling like your surroundings are spinning or moving.  Nausea and vomiting.  Blurred vision.  Dizziness.  Involuntary eye movement (nystagmus).  Symptoms can be mild and cause only slight annoyance, or they can be severe and interfere with daily life. Episodes of benign positional vertigo may return (recur) over time, and they may be triggered by certain movements. Symptoms may improve over time. How is this diagnosed? This condition is usually diagnosed by medical history and a physical exam of  the head, neck, and ears. You may be referred to a health care provider who specializes in ear, nose, and throat (ENT) problems (otolaryngologist) or a provider who specializes in disorders of the nervous system (neurologist). You may have additional testing, including:  MRI.  A CT scan.  Eye movement tests. Your health care provider may ask you to change positions quickly while he or she watches you for symptoms of benign positional vertigo, such as nystagmus. Eye movement may be tested with an electronystagmogram (ENG), caloric stimulation, the Dix-Hallpike test, or the roll test.  An electroencephalogram (EEG). This records electrical activity in your brain.  Hearing tests.  How is this treated? Usually, your health care provider will treat this by moving your head in specific positions to adjust your inner ear back to normal. Surgery may be needed in severe cases, but this is rare. In some cases, benign positional vertigo may resolve on its own in 2-4 weeks. Follow these instructions at home: Safety  Move slowly.Avoid sudden body or head movements.  Avoid driving.  Avoid operating heavy machinery.  Avoid doing any tasks that would be dangerous to you or others if a vertigo episode would occur.  If you have trouble walking or keeping your balance, try using a cane for stability. If you feel dizzy or unstable, sit down right away.  Return to your normal activities as told by your health care provider. Ask your health care provider what activities are safe for you. General instructions  Take over-the-counter and prescription medicines only  as told by your health care provider.  Avoid certain positions or movements as told by your health care provider.  Drink enough fluid to keep your urine clear or pale yellow.  Keep all follow-up visits as told by your health care provider. This is important. Contact a health care provider if:  You have a fever.  Your condition gets worse  or you develop new symptoms.  Your family or friends notice any behavioral changes.  Your nausea or vomiting gets worse.  You have numbness or a "pins and needles" sensation. Get help right away if:  You have difficulty speaking or moving.  You are always dizzy.  You faint.  You develop severe headaches.  You have weakness in your legs or arms.  You have changes in your hearing or vision.  You develop a stiff neck.  You develop sensitivity to light. This information is not intended to replace advice given to you by your health care provider. Make sure you discuss any questions you have with your health care provider. Document Released: 06/08/2006 Document Revised: 02/06/2016 Document Reviewed: 12/24/2014 Elsevier Interactive Patient Education  2018 [employer].  How to Perform the Epley Maneuver The Epley maneuver is an exercise that relieves symptoms of vertigo. Vertigo is the feeling that you or your surroundings are moving when they are not. When you feel vertigo, you may feel like the room is spinning and have trouble walking. Dizziness is a little different than vertigo. When you are dizzy, you may feel unsteady or light-headed. You can do this maneuver at home whenever you have symptoms of vertigo. You can do it up to 3 times a day until your symptoms go away. Even though the Epley maneuver may relieve your vertigo for a few weeks, it is possible that your symptoms will return. This maneuver relieves vertigo, but it does not relieve dizziness. What are the risks? If it is done correctly, the Epley maneuver is considered safe. Sometimes it can lead to dizziness or nausea that goes away after a short time. If you develop other symptoms, such as changes in vision, weakness, or numbness, stop doing the maneuver and call your health care provider. How to perform the Epley maneuver Sit on the edge of a bed or table with your back straight and your legs extended or hanging over  the edge of the bed or table. Turn your head halfway toward the affected ear or side. Lie backward quickly with your head turned until you are lying flat on your back. You may want to position a pillow under your shoulders. Hold this position for 30 seconds. You may experience an attack of vertigo. This is normal. Turn your head to the opposite direction until your unaffected ear is facing the floor. Hold this position for 30 seconds. You may experience an attack of vertigo. This is normal. Hold this position until the vertigo stops. Turn your whole body to the same side as your head. Hold for another 30 seconds. Sit back up. You can repeat this exercise up to 3 times a day. Follow these instructions at home: After doing the Epley maneuver, you can return to your normal activities. Ask your health care provider if there is anything you should do at home to prevent vertigo. He or she may recommend that you: Keep your head raised (elevated) with two or more pillows while you sleep. Do not sleep on the side of your affected ear. Get up slowly from bed. Avoid sudden movements during  the day. Avoid extreme head movement, like looking up or bending over. Contact a health care provider if: Your vertigo gets worse. You have other symptoms, including: Nausea. Vomiting. Headache. Get help right away if: You have vision changes. You have a severe or worsening headache or neck pain. You cannot stop vomiting. You have new numbness or weakness in any part of your body. Summary Vertigo is the feeling that you or your surroundings are moving when they are not. The Epley maneuver is an exercise that relieves symptoms of vertigo. If the Epley maneuver is done correctly, it is considered safe. You can do it up to 3 times a day. This information is not intended to replace advice given to you by your health care provider. Make sure you discuss any questions you have with your health care  provider. Document Released: 09/05/2013 Document Revised: 07/21/2016 Document Reviewed: 07/21/2016 Elsevier Interactive Patient Education  2017 [employer].   Hemorrhagic Stroke A hemorrhagic stroke is the sudden death of brain tissue that occurs when a blood vessel in the brain leaks or bursts (ruptures). When this happens, certain areas of the brain do not get enough oxygen, and blood builds up and presses on certain areas of the brain (hemorrhage). Lack of oxygen and pressure from hemorrhaging can lead to brain damage. There are two major types of hemorrhagic stroke, depending on where bleeding occurs. If bleeding occurs within the brain tissue, the condition is called an intracerebral hemorrhage. If bleeding occurs in the area between the brain and the membrane that covers the brain (subarachnoid space), the condition is called a subarachnoid hemorrhage. Hemorrhagic stroke is a medical emergency. It can cause temporary or permanent brain damage and loss of brain function. What are the causes? This condition is caused by a blood vessel leaking or rupturing, which may be the result of:  Part of a weakened blood vessel wall bulging or ballooning out (cerebral aneurysm).  A hardened, thin blood vessel cracking open and allowing blood to leak out. Blood vessels may become hardened and thin due to plaque buildup.  Tangled blood vessels in the brain (brain arteriovenous malformation).  Protein buildup on artery walls in the brain (amyloid angiopathy).  Inflamed blood vessels (vasculitis).  A tumor in the brain.  High blood pressure (hypertension).  What increases the risk? The following factors may make you more likely to develop this condition:  Hypertension.  Having abnormal blood vessels present since birth (congenital abnormality).  Bleeding disorders, such as hemophilia, sickle cell disease, or liver disease.  The blood becoming too thin while taking blood thinners  (anticoagulants).  Aging.  Moderate or heavy alcohol use.  Using drugs, such as cocaine or methamphetamines.  What are the signs or symptoms? Symptoms of this condition usually appear suddenly, and may include:  Weakness or numbness of the face, arm, or leg, especially on one side of the body.  Confusion.  Difficulty speaking (aphasia) or understanding speech.  Difficulty seeing out of one or both eyes.  Difficulty walking or moving the arms or legs.  Dizziness.  Loss of balance or coordination.  Seizures.  A severe headache with no known cause. This headache may feel like the worst headache ever experienced.  How is this diagnosed? This condition may be diagnosed based on:  Your symptoms.  Your medical history.  A physical exam.  Tests, including: ? Blood tests. ? CT scan. ? MRI.  Angiogram. In this procedure, dye is injected through a long, thin tube (catheter) into  one of your arteries. Then, X-rays are taken. The X-rays will show whether there is a blockage or a problem in a blood vessel.  How is this treated? This condition is a medical emergency that must be treated in a hospital immediately. The goals of treatment are to stop bleeding, reduce pressure on the brain, and relieve symptoms. Treatment may include:  Medicines that: ? Lower blood pressure (antihypertensives). ? Relieve pain (analgesics). ? Relieve nausea or vomiting. ? Stop or prevent seizures (anticonvulsants). ? Relieve fever. ? Prevent blood vessels in the brain from spasming in response to bleeding. ? Control bleeding in the brain.  Assisted breathing (ventilation). This involves using a machine to help you breathe (ventilator).  Receiving donated blood products through an IV tube (transfusion). You will receive cells that help your blood clot.  Placement of a tube (shunt) in the brain to relieve pressure.  Physical, speech, or occupational therapy.  Surgery to stop bleeding,  remove a blood clot or tumor, or reduce pressure.  Treatment depends on the cause, severity, and duration of symptoms. Medicines and changes to your diet may be used to help treat and manage risk factors for stroke, such as diabetes and high blood pressure. Follow these instructions at home: Activity  Return to your normal activities as told by your health care provider. Ask your health care provider what activities are safe for you.  Rest. Rest helps the brain to heal. Make sure you: ? Get plenty of sleep. Avoid staying up late at night. ? Keep a consistent sleep schedule. Try to go to sleep and wake up at about the same time every day. ? Avoid activities that cause physical or mental stress. General instructions   Take over-the-counter and prescription medicines only as told by your health care provider.  Do not drive or operate heavy machinery until your health care provider approves.  Limit alcohol intake to no more than 1 drink per day for nonpregnant women and 2 drinks per day for men. One drink equals 12 oz of beer, 5 oz of wine, or 1 oz of hard liquor.  Use a walker or a cane as told by your health care provider.  Keep all follow-up visits as told by your health care provider, including visits with therapists. This is important. How is this prevented? Your risk of stroke can be decreased by working with your health care provider to treat high blood pressure, high cholesterol, diabetes, heart disease, and obesity. Your risk of stroke can also be decreased by quitting smoking, limiting alcohol, and staying physically active. If you take the blood thinner warfarin, have your bloodwork monitored frequently by your health care provider. Contact a health care provider if: You develop any of the following symptoms:  Headaches that keep coming back (chronic headaches).  Nausea.  Vision problems.  Increased sensitivity to noise or light.  Depression or mood swings.  Anxiety  or irritability.  Memory problems.  Difficulty concentrating or paying attention.  Sleep problems.  Feeling tired all of the time.  Recovery from hemorrhagic stroke varies widely. Talk with your health care provider about what to expect during your recovery. Get help right away if:  You develop symptoms of a hemorrhagic stroke.  You have a partial or total loss of consciousness.  You are taking blood thinners and you fall or you experience minor injury (trauma) to the head.  You have a bleeding disorder and you fall or you experience minor trauma to the head. These symptoms  may represent a serious problem that is an emergency. Do not wait to see if the symptoms will go away. Get medical help right away. Call your local emergency services (911 in the [place] Do not drive yourself to the hospital. This information is not intended to replace advice given to you by your health care provider. Make sure you discuss any questions you have with your health care provider. Document Released: 02/18/2010 Document Revised: 02/06/2016 Document Reviewed: 08/25/2015 Elsevier Interactive Patient Education  [employer].

## 2017-12-28 NOTE — Telephone Encounter (Signed)
Pt reports vertigo, 2 episodes, first on Monday 12/20/17, and one episode this AM. "Severe" on Monday, states room spinning, positional. "Unable to stand,had to lie down; lasted 15 minutes."  Episode this am "mild."  States B/P checked this am 130/89. Also reports "rapid eye movement during first episode" which daughter videoed.Denies nausea, visual changes, states "slght headache." Appt made for today with Dr. Raoul Pitch as Dr. Anitra Lauth full. CAre advise given per protocol.  Reason for Disposition . [1] MODERATE dizziness (e.g., vertigo; feels very unsteady, interferes with normal activities) AND [2] has NOT been evaluated by physician for this  Answer Assessment - Initial Assessment Questions 1. DESCRIPTION: "Describe your dizziness."    Room spinning 2. VERTIGO: "Do you feel like either you or the room is spinning or tilting?"      Yes 3. LIGHTHEADED: "Do you feel lightheaded?" (e.g., somewhat faint, woozy, weak upon standing)     Yes 4. SEVERITY: "How bad is it?"  "Can you walk?"   - MILD - Feels unsteady but walking normally.   - MODERATE - Feels very unsteady when walking, but not falling; interferes with normal activities (e.g., school, work) .   - SEVERE - Unable to walk without falling (requires assistance).     Severe 5. ONSET:  "When did the dizziness begin?"     "Last Monday first episode"  6. AGGRAVATING FACTORS: "Does anything make it worse?" (e.g., standing, change in head position)     Positional 7. CAUSE: "What do you think is causing the dizziness?"     Unsure 8. RECURRENT SYMPTOM: "Have you had dizziness before?" If so, ask: "When was the last time?" "What happened that time?"     No 9. OTHER SYMPTOMS: "Do you have any other symptoms?" (e.g., headache, weakness, numbness, vomiting, earache)     Headache, slightly unsteady.  Protocols used: DIZZINESS - VERTIGO-A-AH

## 2017-12-29 ENCOUNTER — Telehealth: Payer: Self-pay | Admitting: *Deleted

## 2017-12-29 ENCOUNTER — Ambulatory Visit: Payer: 59 | Admitting: Family Medicine

## 2017-12-29 ENCOUNTER — Encounter: Payer: Self-pay | Admitting: Family Medicine

## 2017-12-29 LAB — EXTRA LAV TOP TUBE

## 2017-12-29 LAB — IRON,TIBC AND FERRITIN PANEL
%SAT: 9 % (calc) — ABNORMAL LOW (ref 15–60)
Ferritin: 13 ng/mL — ABNORMAL LOW (ref 20–380)
IRON: 32 ug/dL — AB (ref 50–180)
TIBC: 371 mcg/dL (calc) (ref 250–425)

## 2017-12-29 NOTE — Telephone Encounter (Signed)
Returned patient call he just wanted to verify OTC iron and stool softener brands to use. Advised to consult with pharmacist for recommendations. Patient verbalized understanding.

## 2018-02-03 ENCOUNTER — Other Ambulatory Visit: Payer: Self-pay | Admitting: Cardiology

## 2018-02-04 ENCOUNTER — Other Ambulatory Visit: Payer: Self-pay | Admitting: Family Medicine

## 2018-02-04 MED ORDER — SILDENAFIL CITRATE 100 MG PO TABS: 100.0000 mg | ORAL_TABLET | Freq: Every day | ORAL | 2 refills | Status: DC | PRN

## 2018-02-04 MED ORDER — TELMISARTAN 40 MG PO TABS: 40.0000 mg | ORAL_TABLET | Freq: Every day | ORAL | 0 refills | Status: DC

## 2018-02-04 NOTE — Telephone Encounter (Signed)
Copied from [place] [id number]. Topic: Quick Communication - Rx Refill/Question >> Feb 04, 2018  3:02 PM Arletha Grippe wrote: Medication: clopidogrel (PLAVIX) 75 MG tablet, pantoprazole (PROTONIX) 40 MG tablet , rosuvastatin (CRESTOR) 20 MG tablet , telmisartan (MICARDIS) 40 MG tablet, sildenafil (VIAGRA) 100 MG tablet       Has the patient contacted their pharmacy? No. Changing pharm  (Agent: If no, request that the patient contact the pharmacy for the refill.) (Agent: If yes, when and what did the pharmacy advise?)  Preferred Pharmacy (with phone number or street name): [facility]    Agent: Please be advised that RX refills may take up to 3 business days. We ask that you follow-up with your pharmacy.

## 2018-02-04 NOTE — Telephone Encounter (Signed)
Refills of Plavix and Protonix already available at requested pharmacy. Micardis which was previously sent to [facility] resent to [facility]. Sildenafil refilled.   Pt requesting refill of Rosuvastatin which currently has an expired prescription.   LOV: 08/17/17 Dr. Anitra Lauth  [facility]

## 2018-02-08 MED ORDER — ROSUVASTATIN CALCIUM 20 MG PO TABS: 20.0000 mg | ORAL_TABLET | Freq: Every day | ORAL | 1 refills | Status: DC

## 2018-02-08 NOTE — Telephone Encounter (Signed)
Rx sent 

## 2018-02-23 ENCOUNTER — Other Ambulatory Visit: Payer: Self-pay | Admitting: Family Medicine

## 2018-03-01 ENCOUNTER — Ambulatory Visit: Payer: Self-pay

## 2018-03-01 NOTE — Telephone Encounter (Signed)
Noted  

## 2018-03-01 NOTE — Telephone Encounter (Signed)
Pt. Reports he was stung yesterday evening - "maybe a wasp." Stung on his right knee. Currently no swelling or redness. States he is having stabbing pain that comes and goes. Offered an appointment for today. States he would like to try home remedies and see how he feels. Will try applying ice to the area and Tylenol for pain. Instructed to call if pain continues after 24 hours.Verbalizes understanding. Reason for Disposition . Normal local reaction to bee, wasp, or yellow jacket sting  Answer Assessment - Initial Assessment Questions 1. TYPE: "What type of sting was it?" (bee, yellow jacket, etc.)      Maybe a wasp 2. ONSET: "When did it occur?"      Yesterday around 6:30 p.m. 3. LOCATION: "Where is the sting located?"  "How many stings?"     Right knee 4. SWELLING SIZE: "How big is the swelling?" (inches or centimeters)     No swelling 5. REDNESS: "Is the area red or pink?" If so, ask "What size is area of redness?" (inches or cm). "When did the redness start?"     No redness 6. PAIN: "Is there any pain?" If so, ask: "How bad is it?"  (Scale 1-10; or mild, moderate, severe)     Pain - stabbing.  8 7. ITCHING: "Is there any itching?" If so, ask: "How bad is it?"      No 8. RESPIRATORY DISTRESS: "Describe your breathing."     No 9. PRIOR REACTIONS: "Have you had any severe allergic reactions to stings in the past?" if yes, ask: "What happened?"     No 10. OTHER SYMPTOMS: "Do you have any other symptoms?" (e.g., face or tongue swelling, new rash elsewhere, abdominal pain, vomiting)       Increased pain - worse than yesterday 11. PREGNANCY: "Is there any chance you are pregnant?" "When was your last menstrual period?"       n/a  Protocols used: BEE OR YELLOW JACKET STING-A-AH

## 2018-03-01 NOTE — Telephone Encounter (Signed)
FYI

## 2018-04-06 ENCOUNTER — Encounter: Payer: Self-pay | Admitting: Family Medicine

## 2018-04-06 ENCOUNTER — Other Ambulatory Visit: Payer: Self-pay | Admitting: Family Medicine

## 2018-04-06 NOTE — Telephone Encounter (Signed)
Patient due for office f/u for his iron deficiency. I'll do plavix rx for 1 mo supply, need to see in office sometime in the next month.-thx

## 2018-04-06 NOTE — Telephone Encounter (Signed)
Pt advised and voiced understanding.   

## 2018-05-07 ENCOUNTER — Other Ambulatory Visit: Payer: Self-pay | Admitting: Family Medicine

## 2018-05-09 NOTE — Telephone Encounter (Signed)
Left message for pt to call back  °

## 2018-05-09 NOTE — Telephone Encounter (Signed)
Pt was advised on 04/06/18 that he was due for f/u iron def.  No apt has been made, will send Rx for #14 w/ 0RF.

## 2018-05-11 ENCOUNTER — Other Ambulatory Visit: Payer: Self-pay | Admitting: Family Medicine

## 2018-05-11 NOTE — Telephone Encounter (Signed)
Apt made for 05/12/18 at 3:45pm.

## 2018-05-12 ENCOUNTER — Ambulatory Visit (INDEPENDENT_AMBULATORY_CARE_PROVIDER_SITE_OTHER): Payer: 59 | Admitting: Family Medicine

## 2018-05-12 ENCOUNTER — Encounter: Payer: Self-pay | Admitting: Family Medicine

## 2018-05-12 VITALS — BP 120/77 | HR 75 | Temp 97.7°F | Resp 16 | Ht 66.0 in | Wt 166.5 lb

## 2018-05-12 DIAGNOSIS — E7800 Pure hypercholesterolemia, unspecified: Secondary | ICD-10-CM | POA: Diagnosis not present

## 2018-05-12 DIAGNOSIS — D500 Iron deficiency anemia secondary to blood loss (chronic): Secondary | ICD-10-CM

## 2018-05-12 DIAGNOSIS — Z125 Encounter for screening for malignant neoplasm of prostate: Secondary | ICD-10-CM

## 2018-05-12 DIAGNOSIS — I10 Essential (primary) hypertension: Secondary | ICD-10-CM

## 2018-05-12 DIAGNOSIS — R7303 Prediabetes: Secondary | ICD-10-CM

## 2018-05-12 DIAGNOSIS — I2510 Atherosclerotic heart disease of native coronary artery without angina pectoris: Secondary | ICD-10-CM | POA: Diagnosis not present

## 2018-05-12 DIAGNOSIS — E663 Overweight: Secondary | ICD-10-CM | POA: Diagnosis not present

## 2018-05-12 DIAGNOSIS — K922 Gastrointestinal hemorrhage, unspecified: Secondary | ICD-10-CM

## 2018-05-12 NOTE — Progress Notes (Signed)
OFFICE VISIT  05/12/2018   CC:  Chief Complaint  Patient presents with  . Follow-up    Anemia   HPI:    Patient is a 52 y.o. African-American male who presents for f/u HTN, HLD, and iron deficiency (hx of anemia from UGI bleed 06/2017).  Feeling well.   Taking iron qd x 4 mo. No melena.    BPs consistently normal. Taking cholesterol meds. W/out any significant associated side effects.   Past Medical History:  Diagnosis Date  . Acute epiglottitis 01/2014  . BPH with obstruction/lower urinary tract symptoms    [facility] referral done 01/[facility] (Dr. Risa Grill); ultimately a prostate biopsy was done for increased PSA velocity--April 2013--and it  was completely normal.  . CAD S/P percutaneous coronary angioplasty 05/05/2017   Progressive Angina: (3 V CAD on Cor Calcium Score) --> LEFT HEART CATH & CORONARY ANGIOGRAPHY  / CORONARY STENT INTERVENTION Conclusion  LESION #1: p-mLAD to Mid LAD 80 %s --> PCI SYNERGY DES 2.75X16 - Tapered post-dilation: 3.1-2.9 mm.  LESION #2: pRI 85 % - PCI SYNERGY DES 3X12.  Initial plan ASA/Plavix x 1 yr  --> ASA stopped 06/2017 for GI Bleed-- on Plavix alone and continued w/this 11/2017  . Elevated coronary artery calcium score 03/2017   indicating possible 3 vessel CAD: cardiac cath per Dr. Ellyn Hack 8/22 revealed 2 Vessel obstructive CAD in LAD & RI   . Elevated liver function tests    mild  . Erectile dysfunction   . Family history of colon cancer    father  . Family history of hypertension    strong family history  . Family history of premature coronary heart disease    strong family history; Mother (MI in 35s, then 22s, s/p Valve Sgx in 66s) & oldest Brother (CABG x 4 in 50s)  . History of sciatica 11/2013   responded well to prednisone burst 11/2013  . Hx of colonic polyp 2007; 2014   Recall 07/2018 (Dr. Wynetta Emery at [place])  . Hyperlipidemia    On 40 mg Crestor as of 06/2017  . Hypertension    On telmisartan  . Iron deficiency anemia  06/2017   Hb drop from 15 down to 11 from 04/2017-06/2017.  +H pylori gastritis+  . Prediabetes 11/2016   HbA1c 6.1%     Past Surgical History:  Procedure Laterality Date  . COLONOSCOPY W/ POLYPECTOMY  09/02/06; 07/2013   [facility]: adenomatous polyp w/out high grade dysplasia or malignancy.  Recall 07/2018  . coronary calcium score     Very high risk: as of 03/2017 pt is to return to discuss with Dr. Ellyn Hack: stress test vs cath as next step.  . CORONARY STENT INTERVENTION N/A 05/05/2017   DES x 2. Preserved LV function.  Procedure: CORONARY STENT INTERVENTION;  Surgeon: Leonie Man, MD;  Location: [facility];  Service: Cardiovascular;: CAD S/P PCI: p-mLAD Synergy DES 2.75 x 16 (3.1-2.9 mm), p-mRI Synergy DES 3.0 x 12 (3.3 mm)  . dental implants    . ESOPHAGOGASTRODUODENOSCOPY  07/08/2017   GI RECOMMENDED D/C ASPIRIN:  superfic non-bleeding ulcers ranging 5-35mm in size in antrum.  +H pylori +.  A few non-bleeding erosions in pre-pyloric region.  Normal duodenum.  Marland Kitchen LEFT HEART CATH AND CORONARY ANGIOGRAPHY N/A 05/05/2017   Procedure: LEFT HEART CATH AND CORONARY ANGIOGRAPHY;  Surgeon: Leonie Man, MD;  Location: [place] [facility];  Service: Cardiovascular;  Laterality: N/A; p-m LAD 80%, mRI 90% --> 2 vessel  PCI  . PROSTATE BIOPSY  01/2012   Normal    Outpatient Medications Prior to Visit  Medication Sig Dispense Refill  . clopidogrel (PLAVIX) 75 MG tablet Take 1 tablet (75 mg total) by mouth daily with breakfast. OFFICE VISIT NEEDED FOR MORE REFILLS. 14 tablet 0  . ferrous sulfate 325 (65 FE) MG tablet Take 1 tablet by mouth daily.    . Multiple Vitamin (MULTIVITAMIN WITH MINERALS) TABS tablet Take 1 tablet by mouth daily.    . pantoprazole (PROTONIX) 40 MG tablet TAKE 1 TABLET BY MOUTH EVERY DAY 30 tablet 6  . sildenafil (VIAGRA) 100 MG tablet Take 1 tablet (100 mg total) by mouth daily as needed for erectile dysfunction. 6 tablet 2  . telmisartan (MICARDIS) 40 MG tablet  Take 1 tablet (40 mg total) by mouth daily. 90 tablet 0  . aspirin EC 81 MG tablet Take 81 mg by mouth daily.    . meclizine (ANTIVERT) 25 MG tablet Take 1 tablet (25 mg total) by mouth 3 (three) times daily as needed for dizziness. (Patient not taking: Reported on 05/12/2018) 30 tablet 0  . nitroGLYCERIN (NITROSTAT) 0.4 MG SL tablet Place 1 tablet (0.4 mg total) under the tongue every 5 (five) minutes as needed for chest pain. (Patient not taking: Reported on 12/28/2017) 90 tablet 3  . rosuvastatin (CRESTOR) 20 MG tablet Take 1 tablet (20 mg total) by mouth daily. 90 tablet 1   No facility-administered medications prior to visit.     Allergies  Allergen Reactions  . Ace Inhibitors Cough  . Shrimp [Shellfish Allergy] Other (See Comments)    Swelling around eyes/lips---can eat IF cooked WELL   ROS As per HPI  PE: Blood pressure 120/77, pulse 75, temperature 97.7 F (36.5 C), temperature source Oral, resp. rate 16, height 5\' 6"  (1.676 m), weight 166 lb 8 oz (75.5 kg), SpO2 98 %. Gen: Alert, well appearing.  Patient is oriented to person, place, time, and situation. AFFECT: pleasant, lucid thought and speech. No further exam today.  LABS:  Lab Results  Component Value Date   TSH 0.73 12/08/2016      Chemistry      Component Value Date/Time   NA 139 07/01/2017 1010   NA 138 04/20/2017 0843   K 4.6 07/01/2017 1010   CL 104 07/01/2017 1010   CO2 30 07/01/2017 1010   BUN 19 07/01/2017 1010   BUN 19 04/20/2017 0843   CREATININE 1.35 07/01/2017 1010      Component Value Date/Time   CALCIUM 9.4 07/01/2017 1010   ALKPHOS 50 07/01/2017 1010   AST 28 07/01/2017 1010   ALT 39 07/01/2017 1010   BILITOT 0.4 07/01/2017 1010     Lab Results  Component Value Date   WBC 3.4 (L) 12/28/2017   HGB 14.2 12/28/2017   HCT 42.3 12/28/2017   MCV 85.1 12/28/2017   PLT 160.0 12/28/2017   Lab Results  Component Value Date   IRON 32 (L) 12/28/2017   TIBC 371 12/28/2017   FERRITIN 13 (L)  12/28/2017   Lab Results  Component Value Date   CHOL 147 12/08/2016   HDL 29.80 (L) 12/08/2016   LDLCALC 97 10/23/2015   LDLDIRECT 72.0 12/08/2016   TRIG 220.0 (H) 12/08/2016   CHOLHDL 5 12/08/2016   Lab Results  Component Value Date   PSA 0.68 12/08/2016   PSA 0.61 10/23/2015   PSA 0.59 07/03/2014   Lab Results  Component Value Date   HGBA1C 6.1 12/08/2016  IMPRESSION AND PLAN:  1) HTN: The current medical regimen is effective;  continue present plan and medications.  2) HLD: The current medical regimen is effective;  continue present plan and medications. Tolerating statin.  3) Iron def anemia secondary to upper GI bleed: feeling no abd pain, has no melena, has been on daily Fe SO4 325mg  for the last 4 mo. He can now start taking his pantoprazole on prn basis. D/c iron IF ferritin above 50 and Hb still normal.  4) CAD: asymptomatic.  Plavix, statin, ARB.  No statin b/c of hx of fatigue possibly from this med. Per cardiology, though, would be reasonable to try adding BB if bp control not adequate in future or if pt c/o palpitations.  We chose to have him return at his earliest convenience for lab panel: cbc with iron panel, A1c, FLP, PSA, CMET, HbA1c, TSH.   Next colonoscopy due at the end of this year: pt aware.  Spent 25 min with pt today, with >50% of this time spent in counseling and care coordination regarding the above problems.  An After Visit Summary was printed and given to the patient.  FOLLOW UP: Return in about 6 months (around 11/12/2018).  Signed:  Crissie Sickles, MD           05/12/2018

## 2018-05-13 MED ORDER — TELMISARTAN 40 MG PO TABS: 40.0000 mg | ORAL_TABLET | Freq: Every day | ORAL | 3 refills | Status: DC

## 2018-05-13 MED ORDER — CLOPIDOGREL BISULFATE 75 MG PO TABS: 75.0000 mg | ORAL_TABLET | Freq: Every day | ORAL | 3 refills | Status: DC

## 2018-05-13 NOTE — Addendum Note (Signed)
Addended by: Tammi Sou on: 05/13/2018 08:47 PM   Modules accepted: Orders

## 2018-10-11 ENCOUNTER — Other Ambulatory Visit: Payer: Self-pay | Admitting: Family Medicine

## 2018-10-21 IMAGING — CT CT HEART SCORING
2 series · 16 of 20 positions shown, 18 images · non-contrast
Comparison: None.

CLINICAL DATA: Risk stratification

EXAM:
Coronary Calcium Score
TECHNIQUE: The patient was scanned on a Siemens Somatom 64 slice scanner. Axial
non-contrast 3 mm slices were carried out through the heart. The
data set was analyzed on a dedicated work station and scored using
the Agatson method.

[Series 2: casc 3.0 i36f 2 bestdiast 70 % · axial · 0.33mm/px · z∈[-377,-278]mm · 8 of 43 slices shown, 10 images]
[im 5/43  vessel]
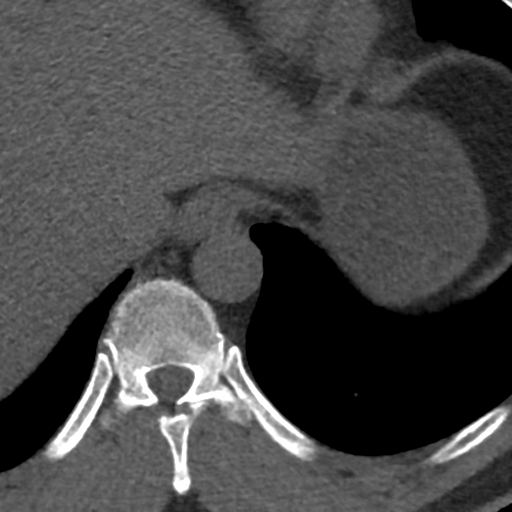
[im 5/43  lung]
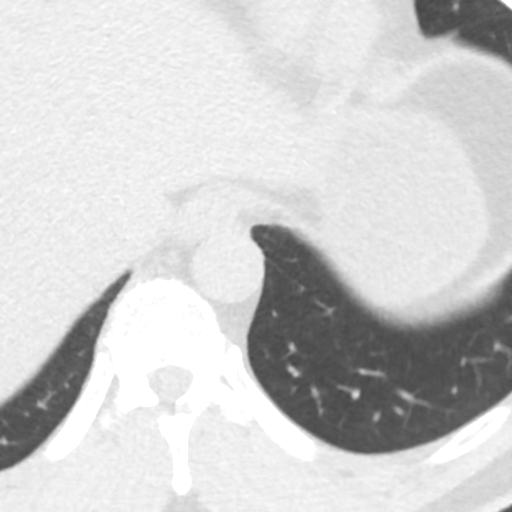
[im 10/43  vessel]
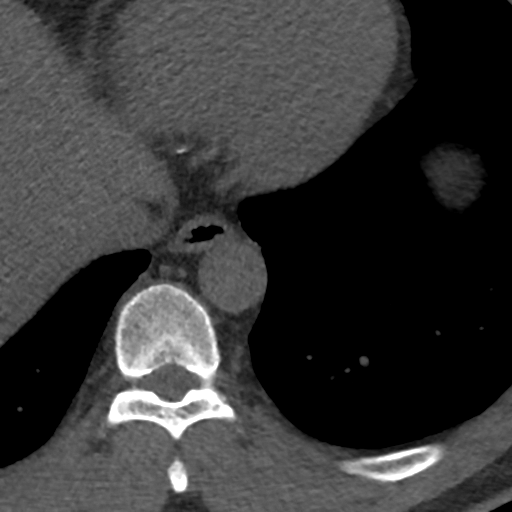
[im 15/43  vessel]
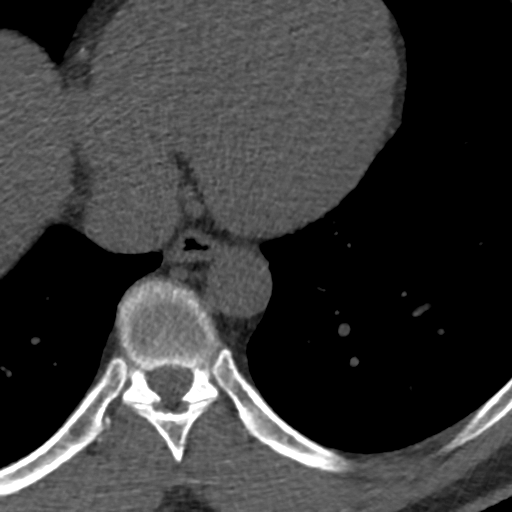
[im 19/43  vessel]
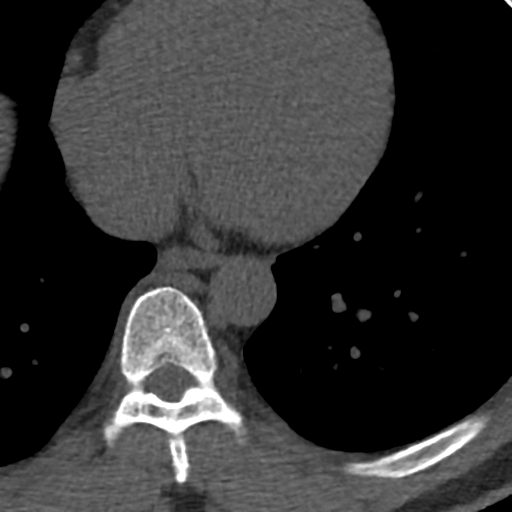
[im 24/43  vessel]
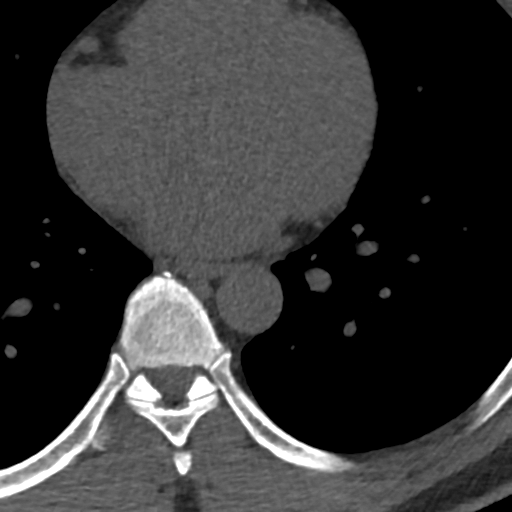
[im 24/43  lung]
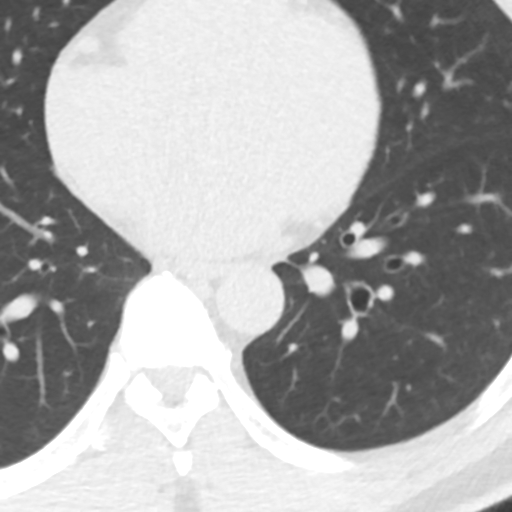
[im 29/43  vessel]
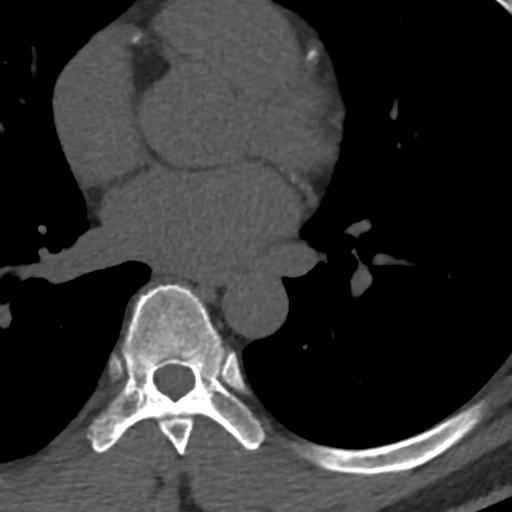
[im 33/43  vessel]
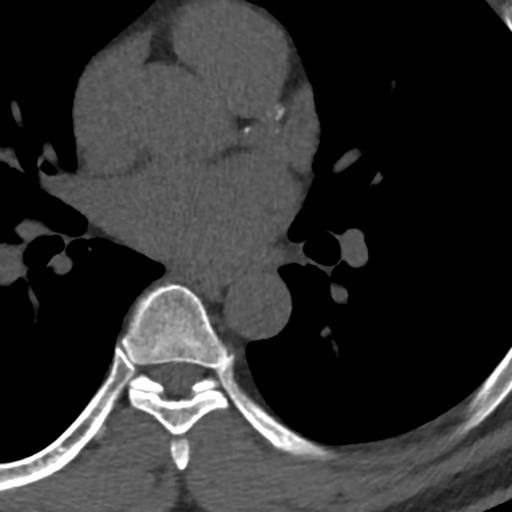
[im 38/43  vessel]
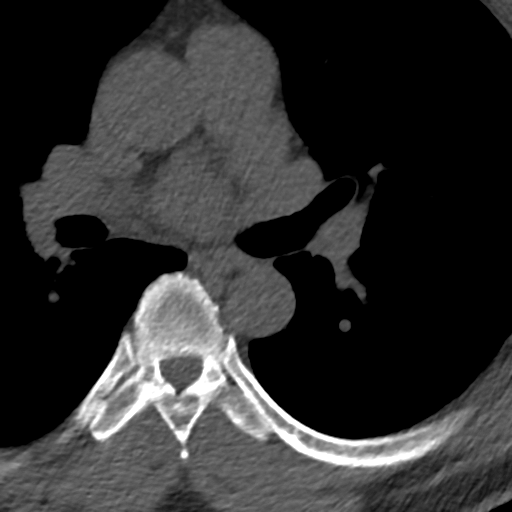

[Series 4: lung st 69 % · axial · 0.68mm/px · z∈[-377,-278]mm · 8 of 43 slices shown]
[im 5/43  lung]
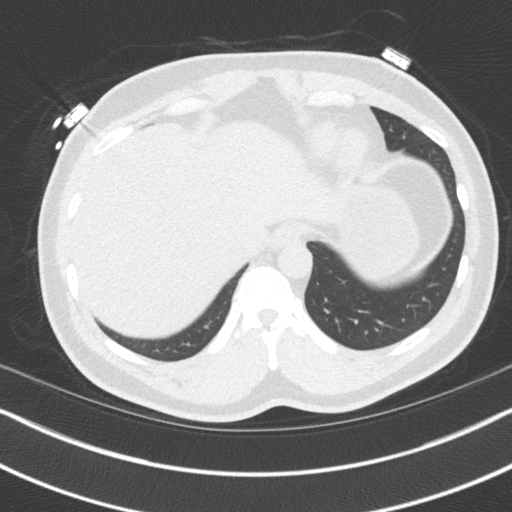
[im 10/43  lung]
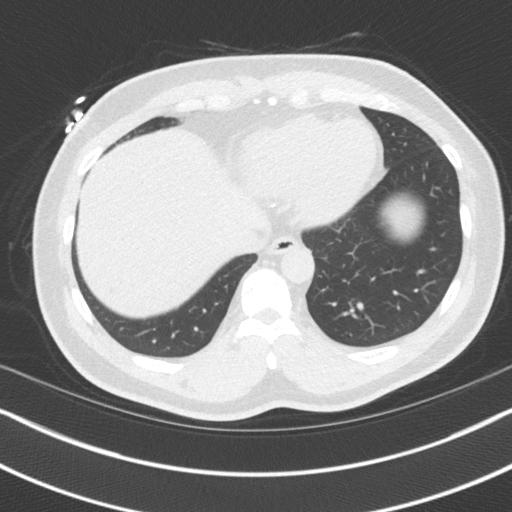
[im 15/43  lung]
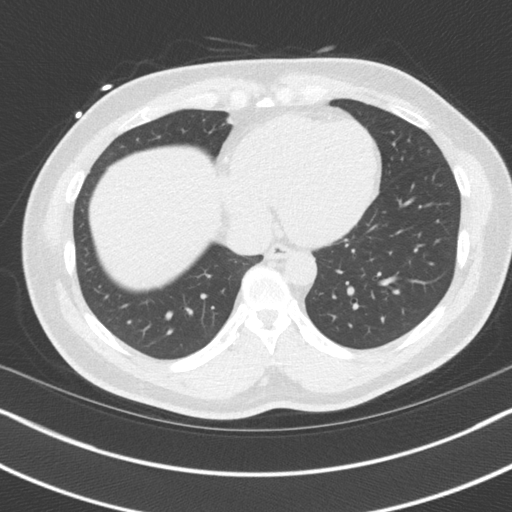
[im 19/43  lung]
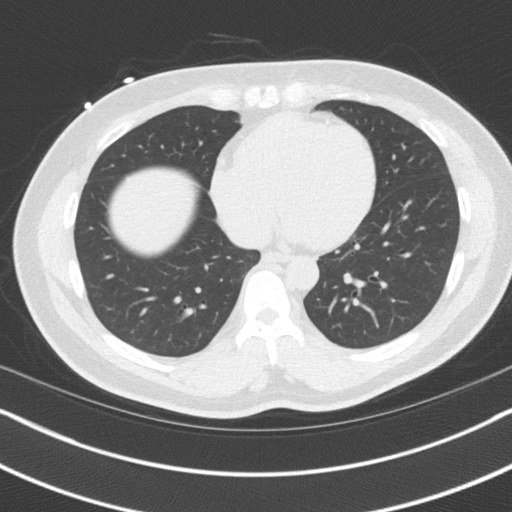
[im 24/43  lung]
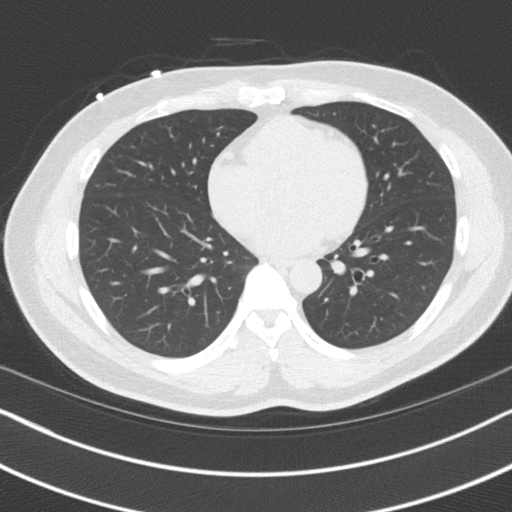
[im 29/43  lung]
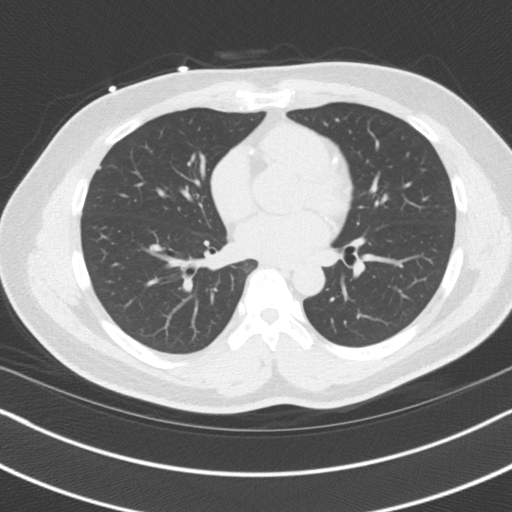
[im 33/43  lung]
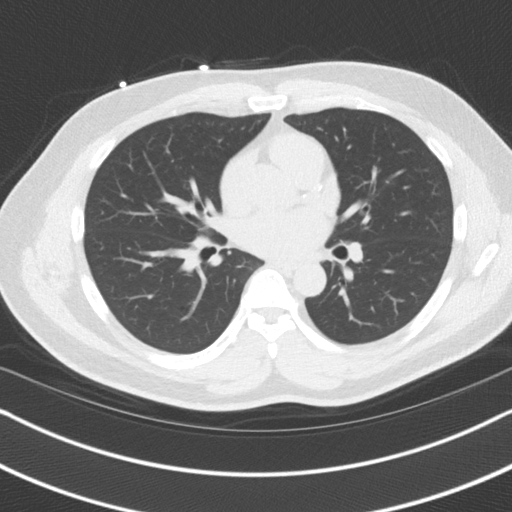
[im 38/43  lung]
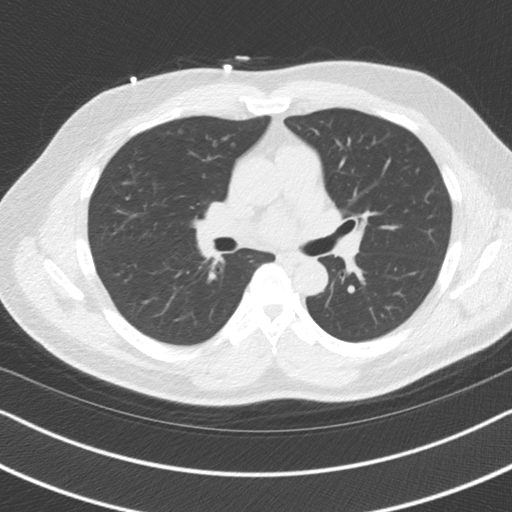

[16 of 20 positions shown; findings below may reference images not displayed]

FINDINGS: Non-cardiac: See separate report from [REDACTED].

Ascending Aorta:  3.3 cm

Pericardium: Normal

Coronary arteries: Significant 3 vessel coronary calcium especially
the proximal / mid LAD and mid RCA
IMPRESSION: Coronary calcium score of 343 . This was 95 th percentile for age
and sex matched control.

Arnfwa Kess Nathane

EXAM:
OVER-READ INTERPRETATION  CT CHEST

The following report is an over-read performed by radiologist Dr.
Li Ling Inda [REDACTED] on 03/26/2017. This over-read
does not include interpretation of cardiac or coronary anatomy or
pathology. The coronary calcium score interpretation by the
cardiologist is attached.
FINDINGS: Cardiovascular: Heart is normal size. Visualized aorta normal
caliber.

Mediastinum/Nodes: No adenopathy in the lower mediastinum or hila.

Lungs/Pleura: Visualized lungs are clear.  No effusions.

Upper Abdomen: Imaging into the upper abdomen shows no acute
findings.

Musculoskeletal: Chest wall soft tissues and visualized bony
structures unremarkable.
IMPRESSION: No acute or significant extracardiac abnormality.

## 2018-12-10 ENCOUNTER — Other Ambulatory Visit: Payer: Self-pay | Admitting: Family Medicine

## 2019-01-30 ENCOUNTER — Ambulatory Visit (INDEPENDENT_AMBULATORY_CARE_PROVIDER_SITE_OTHER): Payer: 59 | Admitting: Family Medicine

## 2019-01-30 ENCOUNTER — Encounter: Payer: Self-pay | Admitting: Family Medicine

## 2019-01-30 VITALS — BP 151/83 | HR 88 | Ht 66.0 in

## 2019-01-30 DIAGNOSIS — R7303 Prediabetes: Secondary | ICD-10-CM

## 2019-01-30 DIAGNOSIS — R5383 Other fatigue: Secondary | ICD-10-CM

## 2019-01-30 DIAGNOSIS — I10 Essential (primary) hypertension: Secondary | ICD-10-CM

## 2019-01-30 DIAGNOSIS — K922 Gastrointestinal hemorrhage, unspecified: Secondary | ICD-10-CM

## 2019-01-30 DIAGNOSIS — K921 Melena: Secondary | ICD-10-CM

## 2019-01-30 DIAGNOSIS — Z862 Personal history of diseases of the blood and blood-forming organs and certain disorders involving the immune mechanism: Secondary | ICD-10-CM

## 2019-01-30 DIAGNOSIS — Z125 Encounter for screening for malignant neoplasm of prostate: Secondary | ICD-10-CM

## 2019-01-30 DIAGNOSIS — I2510 Atherosclerotic heart disease of native coronary artery without angina pectoris: Secondary | ICD-10-CM

## 2019-01-30 DIAGNOSIS — E7800 Pure hypercholesterolemia, unspecified: Secondary | ICD-10-CM

## 2019-01-30 MED ORDER — PANTOPRAZOLE SODIUM 40 MG PO TBEC: 40.0000 mg | DELAYED_RELEASE_TABLET | Freq: Two times a day (BID) | ORAL | 1 refills | Status: DC

## 2019-01-30 NOTE — Progress Notes (Signed)
Virtual Visit via Video Note  I connected with Thomas Spencer on 01/30/19 at  8:20 AM EDT by a video enabled telemedicine application and verified that I am speaking with the correct person using two identifiers.  Location patient: home Location provider:work or home office Persons participating in the virtual visit: patient, provider  I discussed the limitations of evaluation and management by telemedicine and the availability of in person appointments. The patient expressed understanding and agreed to proceed.  Telemedicine visit is a necessity given the COVID-19 restrictions in place at the current time.  HPI: 53 y/o WM being seen today for "feeling sluggish". Onset about 3 wks ago started feeling fatigued and noticing melenotic stool intermittently.  He does have prior hx of upper GI bleed with multiple gastric ulcers in 2018, h pylori +. He had been off of iron and pantoprazole b/c he had been feeling much better since his GI bleed last year, so he restarted pantoprazole 40mg  qd and feSo4 325mg  qd. He has no abd pain, nausea, diarrhea, or constipation.  No BRBPR or hematochezia.   No NSAIDs, but he has remained on his plavix for secondary CV prevention. He is feeling improved over the last 1 week, melena has decreased significantly and he feels better overall. His bp has been mildly elevated and HR in 85-90 range (HR usually 60-70 range).     ROS: no CP, no SOB, no wheezing, no cough, no dizziness, no HAs, no rashes, no melena/hematochezia.  No polyuria or polydipsia.  No myalgias or arthralgias.  Increased stress with more hours working since being home-bound due to covid 19 crisis. More coffee intake than his usual (now 2 cups per day).    Past Medical History:  Diagnosis Date  . Acute epiglottitis 01/2014  . BPH with obstruction/lower urinary tract symptoms    [facility] referral done 01/[facility] (Dr. Risa Grill); ultimately a prostate biopsy was done for increased PSA velocity--April  2013--and it  was completely normal.  . CAD S/P percutaneous coronary angioplasty 05/05/2017   Progressive Angina: (3 V CAD on Cor Calcium Score) --> LEFT HEART CATH & CORONARY ANGIOGRAPHY  / CORONARY STENT INTERVENTION Conclusion  LESION #1: p-mLAD to Mid LAD 80 %s --> PCI SYNERGY DES 2.75X16 - Tapered post-dilation: 3.1-2.9 mm.  LESION #2: pRI 85 % - PCI SYNERGY DES 3X12.  Initial plan ASA/Plavix x 1 yr  --> ASA stopped 06/2017 for GI Bleed-- on Plavix alone and continued w/this 11/2017  . Elevated coronary artery calcium score 03/2017   indicating possible 3 vessel CAD: cardiac cath per Dr. Ellyn Hack 8/22 revealed 2 Vessel obstructive CAD in LAD & RI   . Elevated liver function tests    mild  . Erectile dysfunction   . Family history of colon cancer    father  . Family history of hypertension    strong family history  . Family history of premature coronary heart disease    strong family history; Mother (MI in 10s, then 56s, s/p Valve Sgx in 77s) & oldest Brother (CABG x 4 in 55s)  . History of sciatica 11/2013   responded well to prednisone burst 11/2013  . Hx of colonic polyp 2007; 2014   Recall 07/2018 (Dr. Wynetta Emery at [place])  . Hyperlipidemia    On 40 mg Crestor as of 06/2017  . Hypertension    On telmisartan  . Iron deficiency anemia 06/2017   Hb drop from 15 down to 11 from 04/2017-06/2017.  +H pylori gastritis+  . Prediabetes  11/2016   HbA1c 6.1%     Past Surgical History:  Procedure Laterality Date  . COLONOSCOPY W/ POLYPECTOMY  09/02/06; 07/2013   [facility]: adenomatous polyp w/out high grade dysplasia or malignancy.  Recall 07/2018  . coronary calcium score     Very high risk: as of 03/2017 Thomas Spencer is to return to discuss with Dr. Ellyn Hack: stress test vs cath as next step.  . CORONARY STENT INTERVENTION N/A 05/05/2017   DES x 2. Preserved LV function.  Procedure: CORONARY STENT INTERVENTION;  Surgeon: Leonie Man, MD;  Location: [facility];  Service: Cardiovascular;:  CAD S/P PCI: p-mLAD Synergy DES 2.75 x 16 (3.1-2.9 mm), p-mRI Synergy DES 3.0 x 12 (3.3 mm)  . dental implants    . ESOPHAGOGASTRODUODENOSCOPY  07/08/2017   GI RECOMMENDED D/C ASPIRIN:  superfic non-bleeding ulcers ranging 5-34mm in size in antrum.  +H pylori +.  A few non-bleeding erosions in pre-pyloric region.  Normal duodenum.  Marland Kitchen LEFT HEART CATH AND CORONARY ANGIOGRAPHY N/A 05/05/2017   Procedure: LEFT HEART CATH AND CORONARY ANGIOGRAPHY;  Surgeon: Leonie Man, MD;  Location: [place];  Service: Cardiovascular;  Laterality: N/A; p-m LAD 80%, mRI 90% --> 2 vessel PCI  . PROSTATE BIOPSY  01/2012   Normal    Family History  Problem Relation Age of Onset  . Heart attack Mother 40  . Heart disease Mother 62  . Hyperlipidemia Mother   . Hypertension Mother   . Valvular heart disease Mother 43       s/p MVR-CABG  . Cancer Father        colon  . Cancer Sister        cervical/ remission  . Hyperlipidemia Brother   . Hypertension Brother   . Coronary artery disease Brother 80       CABG x 4  . Diabetes Sister        type 2  . Hypertension Sister   . Hypertension Sister   . Diabetes Sister        type 2    SOCIAL HX: married, 2 children, is a Personal assistant.  No Tob, rare ETOH.   Current Outpatient Medications:  .  clopidogrel (PLAVIX) 75 MG tablet, Take 1 tablet (75 mg total) by mouth daily with breakfast., Disp: 90 tablet, Rfl: 3 .  ferrous sulfate 325 (65 FE) MG tablet, Take 1 tablet by mouth daily., Disp: , Rfl:  .  Multiple Vitamin (MULTIVITAMIN WITH MINERALS) TABS tablet, Take 1 tablet by mouth daily., Disp: , Rfl:  .  pantoprazole (PROTONIX) 40 MG tablet, TAKE 1 TABLET BY MOUTH EVERY DAY, Disp: 30 tablet, Rfl: 6 .  rosuvastatin (CRESTOR) 20 MG tablet, TAKE 1 TABLET BY MOUTH EVERY DAY, Disp: 90 tablet, Rfl: 0 .  sildenafil (VIAGRA) 100 MG tablet, Take 1 tablet (100 mg total) by mouth daily as needed for erectile dysfunction., Disp: 6 tablet, Rfl: 2 .  telmisartan  (MICARDIS) 40 MG tablet, Take 1 tablet (40 mg total) by mouth daily., Disp: 90 tablet, Rfl: 3 .  meclizine (ANTIVERT) 25 MG tablet, Take 1 tablet (25 mg total) by mouth 3 (three) times daily as needed for dizziness. (Patient not taking: Reported on 05/12/2018), Disp: 30 tablet, Rfl: 0 .  nitroGLYCERIN (NITROSTAT) 0.4 MG SL tablet, Place 1 tablet (0.4 mg total) under the tongue every 5 (five) minutes as needed for chest pain. (Patient not taking: Reported on 12/28/2017), Disp: 90 tablet, Rfl: 3  EXAM:  VITALS per patient if applicable: BP (!) 299/37 (BP Location: Left Arm, Patient Position: Sitting, Cuff Size: Normal)   Pulse 88   Ht 5\' 6"  (1.676 m)   BMI 26.87 kg/m    GENERAL: alert, oriented, appears well and in no acute distress  HEENT: atraumatic, conjunttiva clear, no obvious abnormalities on inspection of external nose and ears  NECK: normal movements of the head and neck  LUNGS: on inspection no signs of respiratory distress, breathing rate appears normal, no obvious gross SOB, gasping or wheezing  CV: no obvious cyanosis  MS: moves all visible extremities without noticeable abnormality  PSYCH/NEURO: pleasant and cooperative, no obvious depression or anxiety, speech and thought processing grossly intact  LABS: none today  Lab Results  Component Value Date   TSH 0.73 12/08/2016   Lab Results  Component Value Date   WBC 3.4 (L) 12/28/2017   HGB 14.2 12/28/2017   HCT 42.3 12/28/2017   MCV 85.1 12/28/2017   PLT 160.0 12/28/2017   Lab Results  Component Value Date   IRON 32 (L) 12/28/2017   TIBC 371 12/28/2017   FERRITIN 13 (L) 12/28/2017   Lab Results  Component Value Date   VITAMINB12 904 07/01/2017    Lab Results  Component Value Date   CREATININE 1.35 07/01/2017   BUN 19 07/01/2017   NA 139 07/01/2017   K 4.6 07/01/2017   CL 104 07/01/2017   CO2 30 07/01/2017   Lab Results  Component Value Date   ALT 39 07/01/2017   AST 28 07/01/2017   ALKPHOS 50  07/01/2017   BILITOT 0.4 07/01/2017   Lab Results  Component Value Date   CHOL 147 12/08/2016   Lab Results  Component Value Date   HDL 29.80 (L) 12/08/2016   Lab Results  Component Value Date   LDLCALC 97 10/23/2015   Lab Results  Component Value Date   TRIG 220.0 (H) 12/08/2016   Lab Results  Component Value Date   CHOLHDL 5 12/08/2016   Lab Results  Component Value Date   PSA 0.68 12/08/2016   PSA 0.61 10/23/2015   PSA 0.59 07/03/2014   Lab Results  Component Value Date   HGBA1C 6.1 12/08/2016   ASSESSMENT AND PLAN:  Discussed the following assessment and plan:  1) Fatigue + melena: suspect upper GI bleed.  He does feel like his energy level is starting to stabilize and his melena is less frequent the last week or so. Given this information, I think we'll leave him on his plavix at this time and wait to see what his Hb level is. Also, increase pantoprazole to 40mg  BID.  He knows to avoid NSAIDs. Hemoccults x 3 ordered. If energy level starts dropping and/or melena starts increasing then he knows to stop his plavix and seek medical attention. Will check CBC, iron panel,and TSH.  2) HTN: bp's and HR have been up a little last few weeks. However, we decided not to change meds/add meds at this time. He'll continue to monitor these and call if marked/abrupt change up or down. Check lytes/cr.  3) Hyperchol: continue statin. FLP-future  4) Prediabetes: HbA1c and fasting gluc--future.  5) Preventative health: PSA level-future.   I discussed the assessment and treatment plan with the patient. The patient was provided an opportunity to ask questions and all were answered. The patient agreed with the plan and demonstrated an understanding of the instructions.   The patient was advised to call back or seek an in-person evaluation  if the symptoms worsen or if the condition fails to improve as anticipated.  F/u: 2 wks  Signed:  Crissie Sickles, MD            01/30/2019

## 2019-02-07 ENCOUNTER — Ambulatory Visit (INDEPENDENT_AMBULATORY_CARE_PROVIDER_SITE_OTHER): Payer: 59 | Admitting: Family Medicine

## 2019-02-07 ENCOUNTER — Other Ambulatory Visit: Payer: Self-pay

## 2019-02-07 DIAGNOSIS — K922 Gastrointestinal hemorrhage, unspecified: Secondary | ICD-10-CM

## 2019-02-07 DIAGNOSIS — I2510 Atherosclerotic heart disease of native coronary artery without angina pectoris: Secondary | ICD-10-CM | POA: Diagnosis not present

## 2019-02-07 DIAGNOSIS — I10 Essential (primary) hypertension: Secondary | ICD-10-CM

## 2019-02-07 DIAGNOSIS — Z125 Encounter for screening for malignant neoplasm of prostate: Secondary | ICD-10-CM | POA: Diagnosis not present

## 2019-02-07 DIAGNOSIS — E7800 Pure hypercholesterolemia, unspecified: Secondary | ICD-10-CM | POA: Diagnosis not present

## 2019-02-07 DIAGNOSIS — R7303 Prediabetes: Secondary | ICD-10-CM

## 2019-02-07 DIAGNOSIS — R5383 Other fatigue: Secondary | ICD-10-CM | POA: Diagnosis not present

## 2019-02-07 DIAGNOSIS — Z862 Personal history of diseases of the blood and blood-forming organs and certain disorders involving the immune mechanism: Secondary | ICD-10-CM

## 2019-02-07 DIAGNOSIS — K921 Melena: Secondary | ICD-10-CM

## 2019-02-07 LAB — LIPID PANEL
Cholesterol: 104 mg/dL (ref 0–200)
HDL: 28.4 mg/dL — ABNORMAL LOW (ref >39.00)
LDL Cholesterol: 39 mg/dL (ref 0–99)
NonHDL: 75.48
Total CHOL/HDL Ratio: 4
Triglycerides: 180 mg/dL — ABNORMAL HIGH (ref 0.0–149.0)
VLDL: 36 mg/dL (ref 0.0–40.0)

## 2019-02-07 LAB — COMPREHENSIVE METABOLIC PANEL
ALT: 32 U/L (ref 0–53)
AST: 23 U/L (ref 0–37)
Albumin: 4.1 g/dL (ref 3.5–5.2)
Alkaline Phosphatase: 52 U/L (ref 39–117)
BUN: 19 mg/dL (ref 6–23)
CO2: 27 mEq/L (ref 19–32)
Calcium: 8.9 mg/dL (ref 8.4–10.5)
Chloride: 103 mEq/L (ref 96–112)
Creatinine, Ser: 1.37 mg/dL (ref 0.40–1.50)
GFR: 65.25 mL/min (ref >60.00)
Glucose, Bld: 100 mg/dL — ABNORMAL HIGH (ref 70–99)
Potassium: 4.5 mEq/L (ref 3.5–5.1)
Sodium: 137 mEq/L (ref 135–145)
Total Bilirubin: 0.3 mg/dL (ref 0.2–1.2)
Total Protein: 6.1 g/dL (ref 6.0–8.3)

## 2019-02-07 LAB — CBC WITH DIFFERENTIAL/PLATELET
Basophils Absolute: 0 10*3/uL (ref 0.0–0.1)
Basophils Relative: 1 % (ref 0.0–3.0)
Eosinophils Absolute: 0.1 10*3/uL (ref 0.0–0.7)
Eosinophils Relative: 1.8 % (ref 0.0–5.0)
HCT: 31.6 % — ABNORMAL LOW (ref 39.0–52.0)
Hemoglobin: 11 g/dL — ABNORMAL LOW (ref 13.0–17.0)
Lymphocytes Relative: 23.7 % (ref 12.0–46.0)
Lymphs Abs: 0.7 10*3/uL (ref 0.7–4.0)
MCHC: 34.8 g/dL (ref 30.0–36.0)
MCV: 95.9 fl (ref 78.0–100.0)
Monocytes Absolute: 0.3 10*3/uL (ref 0.1–1.0)
Monocytes Relative: 10.4 % (ref 3.0–12.0)
Neutro Abs: 1.9 10*3/uL (ref 1.4–7.7)
Neutrophils Relative %: 63.1 % (ref 43.0–77.0)
Platelets: 215 10*3/uL (ref 150.0–400.0)
RBC: 3.3 Mil/uL — ABNORMAL LOW (ref 4.22–5.81)
RDW: 14.6 % (ref 11.5–15.5)
WBC: 3 10*3/uL — ABNORMAL LOW (ref 4.0–10.5)

## 2019-02-07 LAB — TSH: TSH: 1.36 u[IU]/mL (ref 0.35–4.50)

## 2019-02-07 LAB — HEMOGLOBIN A1C: Hgb A1c MFr Bld: 5.2 % (ref 4.6–6.5)

## 2019-02-07 LAB — PSA: PSA: 0.64 ng/mL (ref 0.10–4.00)

## 2019-02-08 ENCOUNTER — Encounter: Payer: Self-pay | Admitting: Family Medicine

## 2019-02-08 LAB — IRON,TIBC AND FERRITIN PANEL
%SAT: 9 % (calc) — ABNORMAL LOW (ref 20–48)
Ferritin: 17 ng/mL — ABNORMAL LOW (ref 38–380)
Iron: 29 ug/dL — ABNORMAL LOW (ref 50–180)
TIBC: 332 mcg/dL (calc) (ref 250–425)

## 2019-02-13 ENCOUNTER — Ambulatory Visit: Payer: 59 | Admitting: Family Medicine

## 2019-02-13 ENCOUNTER — Other Ambulatory Visit: Payer: Self-pay

## 2019-02-15 ENCOUNTER — Other Ambulatory Visit: Payer: 59

## 2019-02-15 DIAGNOSIS — K921 Melena: Secondary | ICD-10-CM

## 2019-02-15 DIAGNOSIS — K922 Gastrointestinal hemorrhage, unspecified: Secondary | ICD-10-CM

## 2019-02-15 LAB — HEMOCCULT SLIDES (X 3 CARDS)
Fecal Occult Blood: NEGATIVE
OCCULT 1: NEGATIVE
OCCULT 2: NEGATIVE
OCCULT 3: NEGATIVE
OCCULT 4: NEGATIVE
OCCULT 5: NEGATIVE

## 2019-03-22 ENCOUNTER — Other Ambulatory Visit: Payer: Self-pay | Admitting: Family Medicine

## 2019-04-24 ENCOUNTER — Telehealth: Payer: Self-pay | Admitting: Family Medicine

## 2019-04-24 NOTE — Telephone Encounter (Signed)
Patient has some questions about his foot & has some COVID questions about his daughter going to college. Okay for telephone visit. No openings please advise.

## 2019-04-24 NOTE — Telephone Encounter (Signed)
Ask if he can wait until Thursday and you can double book me at any time that day that is convenient for him (preferably the 4 oclock spot if it is not already double booked).-thx

## 2019-04-25 NOTE — Telephone Encounter (Signed)
Noted  

## 2019-04-25 NOTE — Telephone Encounter (Signed)
Patient agreed to virtual visit 05/03/19

## 2019-04-25 NOTE — Telephone Encounter (Signed)
We are booked tomorrow for both 4'o clock slots and PCP not available Thursday or Friday.  Please advise, thanks.

## 2019-05-04 ENCOUNTER — Encounter: Payer: Self-pay | Admitting: Family Medicine

## 2019-05-04 ENCOUNTER — Ambulatory Visit (INDEPENDENT_AMBULATORY_CARE_PROVIDER_SITE_OTHER): Payer: 59 | Admitting: Family Medicine

## 2019-05-04 ENCOUNTER — Other Ambulatory Visit: Payer: Self-pay

## 2019-05-04 DIAGNOSIS — D500 Iron deficiency anemia secondary to blood loss (chronic): Secondary | ICD-10-CM | POA: Diagnosis not present

## 2019-05-04 DIAGNOSIS — Z7189 Other specified counseling: Secondary | ICD-10-CM

## 2019-05-04 DIAGNOSIS — B353 Tinea pedis: Secondary | ICD-10-CM

## 2019-05-04 DIAGNOSIS — K922 Gastrointestinal hemorrhage, unspecified: Secondary | ICD-10-CM

## 2019-05-04 NOTE — Progress Notes (Signed)
Virtual Visit via Video Note  I connected with pt on 05/04/19 at  4:00 PM EDT by video enabled telemedicine application and verified that I am speaking with the correct person using two identifiers.  Location patient: home Location provider:work or home office Persons participating in the virtual visit: patient, provider  I discussed the limitations of evaluation and management by telemedicine and the availability of in person appointments. The patient expressed understanding and agreed to proceed.  Telemedicine visit is a necessity given the COVID-19 restrictions in place at the current time.  HPI: 53 y/o AAM with whom I am doing a telemedicine visit today (due to COVID-19 pandemic restrictions) for feet rash. Itchy pinkish, flaky rash on both feet bottoms and sides.  Seems to come and go, pt knows it is likely athlete's foot but doesn't know why he keeps getting it. He has gotten it much improved over the last week b/c has been using otc antifungal spray bid.  Hx of GI bleed, see last o/v regarding recurrence of melena and fatigue, + result note from labs from that time. He is not on iron anymore and feels like he is back to normal energy level and is walking daily and has no melena and no abd pains.  Appetite normal.   Has not contacted his GI MD yet (needs colonoscopy) b/c of fear of being in facility with increased traffic/risk of covid.   ROS: See pertinent positives and negatives per HPI.  Past Medical History:  Diagnosis Date  . Acute epiglottitis 01/2014  . BPH with obstruction/lower urinary tract symptoms    [facility] referral done 01/[facility] (Dr. Risa Grill); ultimately a prostate biopsy was done for increased PSA velocity--April 2013--and it  was completely normal.  . CAD S/P percutaneous coronary angioplasty 05/05/2017   Progressive Angina: (3 V CAD on Cor Calcium Score) --> LEFT HEART CATH & CORONARY ANGIOGRAPHY  / CORONARY STENT INTERVENTION Conclusion  LESION #1: p-mLAD to  Mid LAD 80 %s --> PCI SYNERGY DES 2.75X16 - Tapered post-dilation: 3.1-2.9 mm.  LESION #2: pRI 85 % - PCI SYNERGY DES 3X12.  Initial plan ASA/Plavix x 1 yr  --> ASA stopped 06/2017 for GI Bleed-- on Plavix alone and continued w/this 11/2017  . Elevated coronary artery calcium score 03/2017   indicating possible 3 vessel CAD: cardiac cath per Dr. Ellyn Hack 8/22 revealed 2 Vessel obstructive CAD in LAD & RI   . Elevated liver function tests    mild  . Erectile dysfunction   . Family history of colon cancer    father  . Family history of hypertension    strong family history  . Family history of premature coronary heart disease    strong family history; Mother (MI in 59s, then 23s, s/p Valve Sgx in 3s) & oldest Brother (CABG x 4 in 5s)  . History of sciatica 11/2013   responded well to prednisone burst 11/2013  . Hx of colonic polyp 2007; 2014   Recall 07/2018 (Dr. Wynetta Emery at [facility])  . Hyperlipidemia    On 40 mg Crestor as of 06/2017  . Hypertension    On telmisartan  . Iron deficiency anemia 06/2017   Hb drop from 15 down to 11 from 04/2017-06/2017.  +H pylori gastritis+.  Recurrence 01/2019 +correlation with melena and fatigue.  Pt has been back on iron x 2 wks and is making arrangements to see his GI MD as of 02/08/19 (? repeat EGD + is overdue for repeat colonoscopy).  . Prediabetes  11/2016   HbA1c 6.1%     Past Surgical History:  Procedure Laterality Date  . COLONOSCOPY W/ POLYPECTOMY  09/02/06; 07/2013   [facility]: adenomatous polyp w/out high grade dysplasia or malignancy.  Recall 07/2018  . coronary calcium score     Very high risk: as of 03/2017 pt is to return to discuss with Dr. Ellyn Hack: stress test vs cath as next step.  . CORONARY STENT INTERVENTION N/A 05/05/2017   DES x 2. Preserved LV function.  Procedure: CORONARY STENT INTERVENTION;  Surgeon: Leonie Man, MD;  Location: [facility];  Service: Cardiovascular;: CAD S/P PCI: p-mLAD Synergy DES 2.75 x 16 (3.1-2.9  mm), p-mRI Synergy DES 3.0 x 12 (3.3 mm)  . dental implants    . ESOPHAGOGASTRODUODENOSCOPY  07/08/2017   GI RECOMMENDED D/C ASPIRIN:  superfic non-bleeding ulcers ranging 5-75mm in size in antrum.  +H pylori +.  A few non-bleeding erosions in pre-pyloric region.  Normal duodenum.  Marland Kitchen LEFT HEART CATH AND CORONARY ANGIOGRAPHY N/A 05/05/2017   Procedure: LEFT HEART CATH AND CORONARY ANGIOGRAPHY;  Surgeon: Leonie Man, MD;  Location: [facility];  Service: Cardiovascular;  Laterality: N/A; p-m LAD 80%, mRI 90% --> 2 vessel PCI  . PROSTATE BIOPSY  01/2012   Normal    Family History  Problem Relation Age of Onset  . Heart attack Mother 77  . Heart disease Mother 39  . Hyperlipidemia Mother   . Hypertension Mother   . Valvular heart disease Mother 40       s/p MVR-CABG  . Cancer Father        colon  . Cancer Sister        cervical/ remission  . Hyperlipidemia Brother   . Hypertension Brother   . Coronary artery disease Brother 66       CABG x 4  . Diabetes Sister        type 2  . Hypertension Sister   . Hypertension Sister   . Diabetes Sister        type 2     Current Outpatient Medications:  .  clopidogrel (PLAVIX) 75 MG tablet, Take 1 tablet (75 mg total) by mouth daily with breakfast., Disp: 90 tablet, Rfl: 3 .  Multiple Vitamin (MULTIVITAMIN WITH MINERALS) TABS tablet, Take 1 tablet by mouth daily., Disp: , Rfl:  .  pantoprazole (PROTONIX) 40 MG tablet, Take 1 tablet (40 mg total) by mouth 2 (two) times daily., Disp: 60 tablet, Rfl: 1 .  rosuvastatin (CRESTOR) 20 MG tablet, TAKE 1 TABLET BY MOUTH EVERY DAY, Disp: 30 tablet, Rfl: 2 .  sildenafil (VIAGRA) 100 MG tablet, Take 1 tablet (100 mg total) by mouth daily as needed for erectile dysfunction., Disp: 6 tablet, Rfl: 2 .  telmisartan (MICARDIS) 40 MG tablet, Take 1 tablet (40 mg total) by mouth daily., Disp: 90 tablet, Rfl: 3 .  ferrous sulfate 325 (65 FE) MG tablet, Take 1 tablet by mouth daily., Disp: , Rfl:  .   meclizine (ANTIVERT) 25 MG tablet, Take 1 tablet (25 mg total) by mouth 3 (three) times daily as needed for dizziness. (Patient not taking: Reported on 05/12/2018), Disp: 30 tablet, Rfl: 0 .  nitroGLYCERIN (NITROSTAT) 0.4 MG SL tablet, Place 1 tablet (0.4 mg total) under the tongue every 5 (five) minutes as needed for chest pain. (Patient not taking: Reported on 12/28/2017), Disp: 90 tablet, Rfl: 3  EXAM:  VITALS per patient if applicable: There were no vitals  taken for this visit.   GENERAL: alert, oriented, appears well and in no acute distress  HEENT: atraumatic, conjunttiva clear, no obvious abnormalities on inspection of external nose and ears  NECK: normal movements of the head and neck  LUNGS: on inspection no signs of respiratory distress, breathing rate appears normal, no obvious gross SOB, gasping or wheezing  CV: no obvious cyanosis  MS: moves all visible extremities without noticeable abnormality  PSYCH/NEURO: pleasant and cooperative, no obvious depression or anxiety, speech and thought processing grossly intact  LABS: none today    Chemistry      Component Value Date/Time   NA 137 02/07/2019 1102   NA 138 04/20/2017 0843   K 4.5 02/07/2019 1102   CL 103 02/07/2019 1102   CO2 27 02/07/2019 1102   BUN 19 02/07/2019 1102   BUN 19 04/20/2017 0843   CREATININE 1.37 02/07/2019 1102      Component Value Date/Time   CALCIUM 8.9 02/07/2019 1102   ALKPHOS 52 02/07/2019 1102   AST 23 02/07/2019 1102   ALT 32 02/07/2019 1102   BILITOT 0.3 02/07/2019 1102     Lab Results  Component Value Date   HGBA1C 5.2 02/07/2019   Lab Results  Component Value Date   WBC 3.0 (L) 02/07/2019   HGB 11.0 (L) 02/07/2019   HCT 31.6 (L) 02/07/2019   MCV 95.9 02/07/2019   PLT 215.0 02/07/2019   Lab Results  Component Value Date   IRON 29 (L) 02/07/2019   TIBC 332 02/07/2019   FERRITIN 17 (L) 02/07/2019   Lab Results  Component Value Date   PSA 0.64 02/07/2019   PSA 0.68  12/08/2016   PSA 0.61 10/23/2015    ASSESSMENT AND PLAN:  Discussed the following assessment and plan:  1) Tinea pedis: recommended bid use of lamisil for 3 wks as well as daily antifungal spray into shoes for 3 wks. Change towels daily and avoid prolonged heat/sweat on feet if possible. Will try a course of oral antifungal if this is not satisfactory.  2) Hx of GIB: upper GI (h pylori +).  Recurrence of this 01/2019 (+melena + iron def anemia recurrence)-->all sx's resolved with getting back on PPI bid and took oral iron for a couple months.  He does not feel like he needs to get iron/cbc repeat.  Even though this is very likely upper GI bleed recurrence, he is aware of need for rpt colonoscopy due to his hx of adenomatous polyp 2007 and 2014-->recall was 2019. He is in the process of finding GI MD that his insurance will cover for o/v, procedure, and facility cost. He remains on plavix for CAD with hx of stent.  He knows that if melena recurrs he needs to stop this med and call us or his cardiologist or GI MD.  3) Pt had extensive questions about what to do in different scenarios regarding covid 19. Counseled pt and answered questions to the best of my ability.  I discussed the assessment and treatment plan with the patient. The patient was provided an opportunity to ask questions and all were answered. The patient agreed with the plan and demonstrated an understanding of the instructions.   The patient was advised to call back or seek an in-person evaluation if the symptoms worsen or if the condition fails to improve as anticipated.  Spent 25 min with pt today, with >50% of this time spent in counseling and care coordination regarding the above problems.  F/u: as needed  Signed:  Crissie Sickles, MD           05/04/2019

## 2019-06-08 ENCOUNTER — Other Ambulatory Visit: Payer: Self-pay | Admitting: Family Medicine

## 2019-07-06 ENCOUNTER — Other Ambulatory Visit: Payer: Self-pay | Admitting: Family Medicine

## 2019-07-06 ENCOUNTER — Telehealth: Payer: Self-pay | Admitting: Family Medicine

## 2019-07-06 NOTE — Telephone Encounter (Signed)
Rx refill done, pt notified.

## 2019-07-06 NOTE — Telephone Encounter (Signed)
2nd request for refill - he states that [facility] said prescription was not approved. Patient out of meds.  Please call, patient will pick up tomorrow.  pantoprazole (PROTONIX) 40 MG tablet [id number]   [facility]   Thank you

## 2019-07-08 ENCOUNTER — Other Ambulatory Visit: Payer: Self-pay | Admitting: Family Medicine

## 2019-07-11 ENCOUNTER — Other Ambulatory Visit: Payer: Self-pay

## 2019-07-11 MED ORDER — ROSUVASTATIN CALCIUM 20 MG PO TABS: 20.0000 mg | ORAL_TABLET | Freq: Every day | ORAL | 2 refills | Status: DC

## 2019-07-28 ENCOUNTER — Other Ambulatory Visit: Payer: Self-pay | Admitting: Family Medicine

## 2019-07-28 NOTE — Telephone Encounter (Signed)
Patient called to request refills to be sent to [facility]. Patient states his prescriptions always has issues getting these 2 meds filled

## 2019-08-22 ENCOUNTER — Telehealth: Payer: Self-pay | Admitting: Family Medicine

## 2019-08-22 DIAGNOSIS — Z86010 Personal history of colonic polyps: Secondary | ICD-10-CM

## 2019-08-22 DIAGNOSIS — Z1211 Encounter for screening for malignant neoplasm of colon: Secondary | ICD-10-CM

## 2019-08-22 NOTE — Telephone Encounter (Signed)
OK, referral ordered 

## 2019-08-22 NOTE — Telephone Encounter (Signed)
Patient was advised, he would just need to sign release of info form to have records sent to Dr.Perry.

## 2019-08-22 NOTE — Telephone Encounter (Signed)
Pt will come by today to sign.

## 2019-08-22 NOTE — Addendum Note (Signed)
Addended by: Tammi Sou on: 08/22/2019 12:54 PM   Modules accepted: Orders

## 2019-08-22 NOTE — Telephone Encounter (Signed)
Patient has not had colonoscopy since 08/02/2013. Please advise for referral, thanks.

## 2019-08-22 NOTE — Telephone Encounter (Signed)
Patient needs referral to get a colonoscopy done before end of year.  He prefers to have appt with Dr. Scarlette Shorts.  Patient can be reached at [phone]. I told patient someone would follow up with him once Dr. Anitra Lauth has placed referral.  Thank you

## 2019-08-24 ENCOUNTER — Telehealth: Payer: Self-pay | Admitting: Internal Medicine

## 2019-08-24 NOTE — Telephone Encounter (Signed)
Good afternoon Dr. Henrene Pastor, there is a referral for pt to have a colonoscopy.  His previous colon was in 2014 at [facility].  Pt requested you as his GI MD because his wife is your patient and they were very pleased with how her colonoscopy went.  Records from [facility] will be sent to you for review.  Please advise scheduling.

## 2019-08-28 ENCOUNTER — Encounter: Payer: Self-pay | Admitting: Internal Medicine

## 2019-08-29 ENCOUNTER — Other Ambulatory Visit: Payer: Self-pay

## 2019-08-29 ENCOUNTER — Other Ambulatory Visit: Payer: Self-pay | Admitting: Family Medicine

## 2019-08-29 ENCOUNTER — Telehealth: Payer: Self-pay | Admitting: Family Medicine

## 2019-08-29 MED ORDER — ROSUVASTATIN CALCIUM 20 MG PO TABS: 20.0000 mg | ORAL_TABLET | Freq: Every day | ORAL | 0 refills | Status: DC

## 2019-08-29 MED ORDER — TELMISARTAN 40 MG PO TABS: 40.0000 mg | ORAL_TABLET | Freq: Every day | ORAL | 0 refills | Status: DC

## 2019-08-29 MED ORDER — CLOPIDOGREL BISULFATE 75 MG PO TABS: 75.0000 mg | ORAL_TABLET | Freq: Every day | ORAL | 0 refills | Status: DC

## 2019-08-29 NOTE — Telephone Encounter (Signed)
RF's sent for all meds requested, pt notified.

## 2019-08-29 NOTE — Telephone Encounter (Signed)
Patient called in checking to see if we got a refill request from [facility] clopidogrel (PLAVIX) 75 MG tablet, telmisartan (MICARDIS) 40 MG tablet, and rosuvastatin (CRESTOR) 20 MG tablet

## 2019-08-31 ENCOUNTER — Ambulatory Visit (INDEPENDENT_AMBULATORY_CARE_PROVIDER_SITE_OTHER): Payer: 59 | Admitting: Internal Medicine

## 2019-08-31 ENCOUNTER — Encounter: Payer: Self-pay | Admitting: Internal Medicine

## 2019-08-31 ENCOUNTER — Ambulatory Visit (INDEPENDENT_AMBULATORY_CARE_PROVIDER_SITE_OTHER): Payer: 59 | Admitting: Cardiology

## 2019-08-31 ENCOUNTER — Other Ambulatory Visit: Payer: Self-pay

## 2019-08-31 ENCOUNTER — Encounter: Payer: Self-pay | Admitting: Cardiology

## 2019-08-31 ENCOUNTER — Telehealth: Payer: Self-pay

## 2019-08-31 VITALS — BP 118/80 | HR 66 | Temp 96.8°F | Ht 66.0 in | Wt 169.1 lb

## 2019-08-31 VITALS — BP 132/86 | HR 66 | Temp 94.6°F | Ht 64.0 in | Wt 169.9 lb

## 2019-08-31 DIAGNOSIS — I2510 Atherosclerotic heart disease of native coronary artery without angina pectoris: Secondary | ICD-10-CM

## 2019-08-31 DIAGNOSIS — Z7902 Long term (current) use of antithrombotics/antiplatelets: Secondary | ICD-10-CM

## 2019-08-31 DIAGNOSIS — E785 Hyperlipidemia, unspecified: Secondary | ICD-10-CM | POA: Diagnosis not present

## 2019-08-31 DIAGNOSIS — Z955 Presence of coronary angioplasty implant and graft: Secondary | ICD-10-CM | POA: Insufficient documentation

## 2019-08-31 DIAGNOSIS — Z9861 Coronary angioplasty status: Secondary | ICD-10-CM | POA: Diagnosis not present

## 2019-08-31 DIAGNOSIS — R002 Palpitations: Secondary | ICD-10-CM

## 2019-08-31 DIAGNOSIS — I209 Angina pectoris, unspecified: Secondary | ICD-10-CM | POA: Diagnosis not present

## 2019-08-31 DIAGNOSIS — Z1159 Encounter for screening for other viral diseases: Secondary | ICD-10-CM | POA: Diagnosis not present

## 2019-08-31 DIAGNOSIS — I10 Essential (primary) hypertension: Secondary | ICD-10-CM | POA: Diagnosis not present

## 2019-08-31 DIAGNOSIS — Z86010 Personal history of colonic polyps: Secondary | ICD-10-CM

## 2019-08-31 MED ORDER — NA SULFATE-K SULFATE-MG SULF 17.5-3.13-1.6 GM/177ML PO SOLN: 1.0000 | Freq: Once | ORAL | 0 refills | Status: DC

## 2019-08-31 NOTE — Telephone Encounter (Signed)
Pt has been made aware he will need an appt for his pre op clearance. Pt last seen with Dr. Ellyn Hack in 2019. Pt is seeing Dr. Ellyn Hack today at 2 pm for pre op clearance. Pt aware he will need to wear his mask for the entire visit. Pt thanked me for the call. I will send clearance notes to Dr. Ellyn Hack for appt.

## 2019-08-31 NOTE — Telephone Encounter (Signed)
[facility] Pre-operative Risk Assessment     Request for surgical clearance:     Endoscopy Procedure  What type of surgery is being performed?     Endoscopy/colonoscopy  When is this surgery scheduled?     09/05/2020  What type of clearance is required ?   Pharmacy  Are there any medications that need to be held prior to surgery and how long? Plavix - 5 days  Practice name and name of physician performing surgery?      [facility]  What is your office phone and fax number?      Phone- [phone]  [phone]  Anesthesia type (None, local, MAC, general) ?       MAC  URGENT - patient needs to be able to stop tomorrow - 09/01/19

## 2019-08-31 NOTE — Progress Notes (Signed)
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  Son at the [employer] and daughter at [facility] state.  He presents today regarding surveillance colonoscopy.  He has a history of coronary artery disease for which she underwent coronary artery stenting August 2018.  He is on chronic Plavix therapy.  He sees Dr. Ellyn Hack.  Last cardiology evaluation February 2019.  Patient has had no recent cardiac issues.  His last colonoscopy was performed November 2014 with Dr. Earle Gell.  Found to have a sigmoid colon polyp which was removed and found to be tubular adenoma.  Follow-up in 5 years recommended.  Patient's GI review of systems is entirely negative.  There is a reported family history of colon cancer.  Review of blood work from Feb 07, 2019 finds unremarkable comprehensive metabolic panel.  CBC reveals mild anemia with hemoglobin 11.0.  MCV 95.9.  He did have Hemoccult studies performed June 2020.  These were negative x6.  Of importance, the patient had superficial nonbleeding gastric ulcers on upper endoscopy performed [facility] October 2018.  He does take daily PPI in the form of pantoprazole.  Biopsies revealed Helicobacter pylori gastritis for which he reports being treated.  REVIEW OF SYSTEMS:  All non-GI ROS negative unless otherwise stated in the HPI   Past Medical History:  Diagnosis Date  . Acute epiglottitis 01/2014  . BPH with obstruction/lower urinary tract symptoms    [facility] referral done 01/[facility] (Dr. Risa Grill); ultimately a prostate biopsy was done for increased PSA velocity--April 2013--and it  was completely normal.  . CAD S/P percutaneous coronary angioplasty 05/05/2017   Progressive Angina: (3 V CAD on Cor Calcium Score) --> LEFT HEART CATH & CORONARY ANGIOGRAPHY  / CORONARY STENT INTERVENTION Conclusion  LESION #1: p-mLAD to Mid LAD 80 %s --> PCI SYNERGY DES 2.75X16 - Tapered post-dilation: 3.1-2.9  mm.  LESION #2: pRI 85 % - PCI SYNERGY DES 3X12.  Initial plan ASA/Plavix x 1 yr  --> ASA stopped 06/2017 for GI Bleed-- on Plavix alone and continued w/this 11/2017  . Elevated coronary artery calcium score 03/2017   indicating possible 3 vessel CAD: cardiac cath per Dr. Ellyn Hack 8/22 revealed 2 Vessel obstructive CAD in LAD & RI   . Elevated liver function tests    mild  . Erectile dysfunction   . Family history of colon cancer    father  . Family history of hypertension    strong family history  . Family history of premature coronary heart disease    strong family history; Mother (MI in 70s, then 60s, s/p Valve Sgx in 41s) & oldest Brother (CABG x 4 in 81s)  . History of sciatica 11/2013   responded well to prednisone burst 11/2013  . Hx of colonic polyp 2007; 2014   Recall 07/2018 (Dr. Wynetta Emery at [facility])  . Hyperlipidemia    On 40 mg Crestor as of 06/2017  . Hypertension    On telmisartan  . Iron deficiency anemia 06/2017   Hb drop from 15 down to 11 from 04/2017-06/2017.  +H pylori gastritis+.  Recurrence 01/2019 +correlation with melena and fatigue.  Pt has been back on iron x 2 wks and is making arrangements to see his GI MD as of 02/08/19 (? repeat EGD + is overdue for repeat colonoscopy).  . Prediabetes 11/2016   HbA1c 6.1%     Past Surgical History:  Procedure Laterality Date  .  COLONOSCOPY W/ POLYPECTOMY  09/02/06; 07/2013   [facility]: adenomatous polyp w/out high grade dysplasia or malignancy.  Recall 07/2018  . coronary calcium score     Very high risk: as of 03/2017 pt is to return to discuss with Dr. Ellyn Hack: stress test vs cath as next step.  . CORONARY STENT INTERVENTION N/A 05/05/2017   DES x 2. Preserved LV function.  Procedure: CORONARY STENT INTERVENTION;  Surgeon: Leonie Man, MD;  Location: [facility];  Service: Cardiovascular;: CAD S/P PCI: p-mLAD Synergy DES 2.75 x 16 (3.1-2.9 mm), p-mRI Synergy DES 3.0 x 12 (3.3 mm)  . dental implants    .  ESOPHAGOGASTRODUODENOSCOPY  07/08/2017   GI RECOMMENDED D/C ASPIRIN:  superfic non-bleeding ulcers ranging 5-78mm in size in antrum.  +H pylori +.  A few non-bleeding erosions in pre-pyloric region.  Normal duodenum.  Marland Kitchen LEFT HEART CATH AND CORONARY ANGIOGRAPHY N/A 05/05/2017   Procedure: LEFT HEART CATH AND CORONARY ANGIOGRAPHY;  Surgeon: Leonie Man, MD;  Location: [facility];  Service: Cardiovascular;  Laterality: N/A; p-m LAD 80%, mRI 90% --> 2 vessel PCI  . PROSTATE BIOPSY  01/2012   Normal    Social History Thomas Spencer  reports that he has never smoked. He has never used smokeless tobacco. He reports current alcohol use of about 2.0 standard drinks of alcohol per week. He reports that he does not use drugs.  family history includes Cancer in his father and sister; Coronary artery disease (age of onset: 71) in his brother; Diabetes in his sister and sister; Heart attack (age of onset: 4) in his mother; Heart disease (age of onset: 11) in his mother; Hyperlipidemia in his brother and mother; Hypertension in his brother, mother, sister, and sister; Valvular heart disease (age of onset: 11) in his mother.  Allergies  Allergen Reactions  . Ace Inhibitors Cough  . Shrimp [Shellfish Allergy] Other (See Comments)    Swelling around eyes/lips---can eat IF cooked WELL       PHYSICAL EXAMINATION: Vital signs: BP 118/80 (BP Location: Right Arm, Patient Position: Sitting, Cuff Size: Normal)   Pulse 66   Temp (!) 96.8 F (36 C)   Ht 5\' 6"  (1.676 m)   Wt 169 lb 2 oz (76.7 kg)   SpO2 100%   BMI 27.30 kg/m   Constitutional: generally well-appearing, no acute distress Psychiatric: alert and oriented x3, cooperative Eyes: extraocular movements intact, anicteric, conjunctiva pink Mouth: oral pharynx moist, no lesions Neck: supple no lymphadenopathy Cardiovascular: heart regular rate and rhythm, no murmur Lungs: clear to auscultation bilaterally Abdomen: soft, nontender,  nondistended, no obvious ascites, no peritoneal signs, normal bowel sounds, no organomegaly Rectal: Deferred until colonoscopy Extremities: no clubbing, cyanosis, or lower extremity edema bilaterally Skin: no lesions on visible extremities Neuro: No focal deficits.  Cranial nerves intact  ASSESSMENT:  1.  History of adenomatous colon polyp 2014.  Due for surveillance 2.  History of gastric ulcers associated with Helicobacter pylori on EGD also October 2018.  The patient is maintained on PPI 3.  History of coronary artery disease with prior coronary artery stent placement August 2018.  On chronic Plavix   PLAN:  1.  Surveillance colonoscopy.  The patient is high risk given his comorbidities and the need to address Plavix.The nature of the procedure, as well as the risks, benefits, and alternatives were carefully and thoroughly reviewed with the patient. Ample time for discussion and questions allowed. The patient understood, was satisfied, and  agreed to proceed. 2.  Continue daily PPI to reduce the risk of recurrent ulcer disease 3.  Hold Plavix 5 days prior to the colonoscopy if okay with his cardiologist Dr. Ellyn Hack.  We will correspond.

## 2019-08-31 NOTE — Telephone Encounter (Signed)
   Primary Cardiologist:David Ellyn Hack, MD  Chart reviewed as part of pre-operative protocol coverage. Because of Niquan Defina past medical history and time since last visit, he/she will require a follow-up visit in order to better assess preoperative cardiovascular risk.  Pt has not been seen since 10/2017.   Pre-op covering staff: - Please schedule appointment and call patient to inform them. - Please contact requesting surgeon's office via preferred method (i.e, phone, fax) to inform them of need for appointment prior to surgery.  If applicable, this message will also be routed to pharmacy pool and/or primary cardiologist for input on holding anticoagulant/antiplatelet agent as requested below so that this information is available at time of patient's appointment.   Kathyrn Drown, NP  08/31/2019, 12:01 PM

## 2019-08-31 NOTE — Patient Instructions (Signed)
Medication Instructions:  No changes *If you need a refill on your cardiac medications before your next appointment, please call your pharmacy*  Lab Work: Not needed   Testing/Procedures: Not needed  Follow-Up: At [facility], you and your health needs are our priority.  As part of our continuing mission to provide you with exceptional heart care, we have created designated Provider Care Teams.  These Care Teams include your primary Cardiologist (physician) and Advanced Practice Providers (APPs -  Physician Assistants and Nurse Practitioners) who all work together to provide you with the care you need, when you need it.  Your next appointment:   12 month(s)  The format for your next appointment:   In Person  Provider:   Glenetta Hew, MD  Other Instructions  okay to have colonoscopy and can hold plavix 5 to 7 days prior

## 2019-08-31 NOTE — Patient Instructions (Signed)
You have been scheduled for a colonoscopy. Please follow written instructions given to you at your visit today.  Please pick up your prep supplies at the pharmacy within the next 1-3 days. If you use inhalers (even only as needed), please bring them with you on the day of your procedure.  You will be contacted by our office prior to your procedure for directions on holding your Plavix.  If you do not hear from our office 1 week prior to your scheduled procedure, please call [phone] to discuss.

## 2019-08-31 NOTE — Progress Notes (Signed)
Primary Care Provider: Tammi Sou, MD Cardiologist: Glenetta Hew, MD Electrophysiologist:   Clinic Note: Chief Complaint  Patient presents with  . Follow-up    Preop colonoscopy  . Coronary Artery Disease    History of PCI    HPI:    Thomas Spencer is a 53 y.o. male with a history of two-vessel CAD with PCI to the LAD and RI reviewed below who presents today for almost 2-year follow-up as part of preop evaluation for CAD.Marland Kitchen  He is a Hydrologist with an Art gallery manager.  He has 2 children-his son is a Banker at [employer], and his daughter is now at [employer].  Thomas Spencer was last seen on November 05, 2017 -> had pretty much completed treatment for peptic ulcer disease.  He was finally starting to feel better, energy improving.  No longer having issues with anemia.  No longer having chest pain or dyspnea with exertion. --> Aspirin placed completely on hold.  Continued on ARB, statin and Plavix.  No beta-blocker because of bradycardia.  Recent Hospitalizations: None  Reviewed  CV studies:    The following studies were reviewed today: (if available, images/films reviewed: From Epic Chart or Care Everywhere) . None:   Interval History:   Thomas Spencer returns here today for delayed follow-up stating that he is feeling well.  He is not having any symptoms whatsoever of chest pain or pressure with rest or exertion.  He feels great.  Being off the aspirin has really helped his heartburn type symptoms.  He takes iron supplements still have a little constipation but other than that is doing great.  He has not had any further bleeding issues with Plavix.  May be some mild bruising here and there.  Has pending colonoscopy with Dr. Henrene Pastor, and realized he needed a follow-up appointment to discuss holding Plavix.  Otherwise totally asymptomatic low-grade standpoint.  He is very active.  CV Review of Symptoms (Summary) no chest pain or dyspnea on  exertion positive for - Fleeting twinges in his chest and fluttering but nothing significant. negative for - edema, irregular heartbeat, orthopnea, paroxysmal nocturnal dyspnea, rapid heart rate, shortness of breath or Syncope/near syncope, TIA/amaurosis fugax, claudication  The patient does not have symptoms concerning for COVID-19 infection (fever, chills, cough, or new shortness of breath).  The patient is practicing social distancing. ++ Masking.  He goes out sparingly for groceries/shopping.  Easily able to maintain social distancing, masking and hand sensitization at work   [place] was performed. Review of Systems  Constitutional: Negative for malaise/fatigue and weight loss.  HENT: Negative for congestion and nosebleeds.   Respiratory: Negative for cough and shortness of breath.   Gastrointestinal: Positive for heartburn (Off-and-on cough but no significant symptoms like he had before.). Negative for blood in stool and melena.  Genitourinary: Negative for hematuria.  Musculoskeletal: Positive for joint pain (Mild aches and pains but nothing significant). Negative for falls.  Neurological: Negative for dizziness and headaches.  Psychiatric/Behavioral: Negative for memory loss. The patient is not nervous/anxious and does not have insomnia.   All other systems reviewed and are negative.  I have reviewed and (if needed) personally updated the patient's problem list, medications, allergies, past medical and surgical history, social and family history.   PAST MEDICAL HISTORY   Past Medical History:  Diagnosis Date  . Acute epiglottitis 01/2014  . BPH with obstruction/lower urinary tract symptoms    [facility]  referral done 01/2011 (Dr. Risa Grill); ultimately a prostate biopsy was done for increased PSA velocity--April 2013--and it  was completely normal.  . CAD S/P percutaneous coronary angioplasty 05/05/2017   Progressive Angina: (3 V CAD on Cor  Calcium Score) --> LEFT HEART CATH & CORONARY ANGIOGRAPHY  / CORONARY STENT INTERVENTION Conclusion  LESION #1: p-mLAD to Mid LAD 80 %s --> PCI SYNERGY DES 2.75X16 - Tapered post-dilation: 3.1-2.9 mm.  LESION #2: pRI 85 % - PCI SYNERGY DES 3X12.  Initial plan ASA/Plavix x 1 yr  --> ASA stopped 06/2017 for GI Bleed-- on Plavix alone and continued w/this 11/2017  . Elevated coronary artery calcium score 03/2017   indicating possible 3 vessel CAD: cardiac cath per Dr. Ellyn Hack 8/22 revealed 2 Vessel obstructive CAD in LAD & RI   . Elevated liver function tests    mild  . Erectile dysfunction   . Family history of colon cancer    father  . Family history of hypertension    strong family history  . Family history of premature coronary heart disease    strong family history; Mother (MI in 80s, then 38s, s/p Valve Sgx in 36s) & oldest Brother (CABG x 4 in 26s)  . History of sciatica 11/2013   responded well to prednisone burst 11/2013  . Hx of colonic polyp 2007; 2014   Recall 07/2018 (Dr. Wynetta Emery at [facility])  . Hyperlipidemia    On 40 mg Crestor as of 06/2017  . Hypertension    On telmisartan  . Iron deficiency anemia 06/2017   Hb drop from 15 down to 11 from 04/2017-06/2017.  +H pylori gastritis+.  Recurrence 01/2019 +correlation with melena and fatigue.  Pt has been back on iron x 2 wks and is making arrangements to see his GI MD as of 02/08/19 (? repeat EGD + is overdue for repeat colonoscopy).  . Prediabetes 11/2016   HbA1c 6.1%      PAST SURGICAL HISTORY   Past Surgical History:  Procedure Laterality Date  . COLONOSCOPY W/ POLYPECTOMY  09/02/06; 07/2013   [facility]: adenomatous polyp w/out high grade dysplasia or malignancy.  Recall 07/2018  . coronary calcium score     Very high risk: as of 03/2017 pt is to return to discuss with Dr. Ellyn Hack: stress test vs cath as next step.  . CORONARY STENT INTERVENTION N/A 05/05/2017   DES x 2. Preserved LV function.  Procedure: CORONARY STENT  INTERVENTION;  Surgeon: Leonie Man, MD;  Location: [facility];  Service: Cardiovascular;: CAD S/P PCI: p-mLAD Synergy DES 2.75 x 16 (3.1-2.9 mm), p-mRI Synergy DES 3.0 x 12 (3.3 mm)  . dental implants    . ESOPHAGOGASTRODUODENOSCOPY  07/08/2017   GI RECOMMENDED D/C ASPIRIN:  superfic non-bleeding ulcers ranging 5-11mm in size in antrum.  +H pylori +.  A few non-bleeding erosions in pre-pyloric region.  Normal duodenum.  Marland Kitchen LEFT HEART CATH AND CORONARY ANGIOGRAPHY N/A 05/05/2017   Procedure: LEFT HEART CATH AND CORONARY ANGIOGRAPHY;  Surgeon: Leonie Man, MD;  Location: [facility];  Service: Cardiovascular;  Laterality: N/A; p-m LAD 80%, mRI 90% --> 2 vessel PCI  . PROSTATE BIOPSY  01/2012   Normal    Diagnostic Diagram 05/05/2017  Post-Intervention Diagram                         PCI LAD Synergy DES: 2.75x 16 (~3.0); PCI RI Synergy 3.0 x 12 (3.3 mm)                MEDICATIONS/ALLERGIES   Current Meds  Medication Sig  . clopidogrel (PLAVIX) 75 MG tablet Take 1 tablet (75 mg total) by mouth daily with breakfast. OFFICE VISIT NEEDED  . ferrous sulfate 325 (65 FE) MG tablet Take 1 tablet by mouth daily.  . Multiple Vitamin (MULTIVITAMIN WITH MINERALS) TABS tablet Take 1 tablet by mouth daily.  . pantoprazole (PROTONIX) 40 MG tablet TAKE 1 TABLET BY MOUTH TWICE A DAY  . rosuvastatin (CRESTOR) 20 MG tablet Take 1 tablet (20 mg total) by mouth daily.  . sildenafil (VIAGRA) 100 MG tablet Take 1 tablet (100 mg total) by mouth daily as needed for erectile dysfunction.  Marland Kitchen telmisartan (MICARDIS) 40 MG tablet Take 1 tablet (40 mg total) by mouth daily. OFFICE VISIT NEEDED    Allergies  Allergen Reactions  . Ace Inhibitors Cough  . Shrimp [Shellfish Allergy] Other (See Comments)    Swelling around eyes/lips---can eat IF cooked WELL     SOCIAL HISTORY/FAMILY HISTORY   Social History   Tobacco Use  . Smoking status: Never  Smoker  . Smokeless tobacco: Never Used  Substance Use Topics  . Alcohol use: Yes    Alcohol/week: 2.0 standard drinks    Types: 2 Glasses of wine per week    Comment: weekly  . Drug use: No   Social History   Social History Narrative   SH: Married, two teenagers (1 boy and 1 girl) as of 10/2015.   He is a Personal assistant, lives in [place], [place].   He has a PhD in a vertical engineering any [employer] in intellectual property   No Tobacco, rare ETOH, no drug use.   Exercise occasionally - most days the week he goes to the gym.          Family History family history includes Cancer in his father and sister; Coronary artery disease (age of onset: 26) in his brother; Diabetes in his sister and sister; Heart attack (age of onset: 60) in his mother; Heart disease (age of onset: 26) in his mother; Hyperlipidemia in his brother and mother; Hypertension in his brother, mother, sister, and sister; Valvular heart disease (age of onset: 16) in his mother.   OBJCTIVE -PE, EKG, labs   Wt Readings from Last 3 Encounters:  08/31/19 169 lb 14.4 oz (77.1 kg)  08/31/19 169 lb 2 oz (76.7 kg)  05/12/18 166 lb 8 oz (75.5 kg)    Physical Exam: BP 132/86 (BP Location: Left Arm, Patient Position: Sitting, Cuff Size: Normal)   Pulse 66   Temp (!) 94.6 F (34.8 C)   Ht 5\' 4"  (1.626 m)   Wt 169 lb 14.4 oz (77.1 kg)   BMI 29.16 kg/m  Physical Exam  Constitutional: He is oriented to person, place, and time. He appears well-developed and well-nourished. No distress.  Healthy-appearing.  Well-groomed  HENT:  Head: Normocephalic and atraumatic.  Neck: No hepatojugular reflux and no JVD present. Carotid bruit is not present. No thyromegaly present.  Cardiovascular: Normal rate, regular rhythm, normal heart sounds, intact distal pulses and normal pulses.  No extrasystoles are present. PMI is not displaced. Exam reveals no gallop and no friction rub.  No murmur heard. Pulmonary/Chest: Effort normal and breath  sounds normal. No respiratory distress. He has no wheezes. He has no rales.  Abdominal: Soft. Bowel sounds are normal. He exhibits no distension. There is no abdominal tenderness. There is no rebound.  No HSM  Musculoskeletal:        General: No edema. Normal range of motion.     Cervical back: Normal range of motion and neck supple.  Neurological: He is alert and oriented to person, place, and time.  Psychiatric: He has a normal mood and affect. His behavior is normal. Judgment and thought content normal.  Vitals reviewed.    Adult ECG Report  Rate: 66 ;  Rhythm: normal sinus rhythm and Normal axis, intervals and durations.;   Narrative Interpretation: Normal EKG  Reent Labs:    Lab Results  Component Value Date   CHOL 104 02/07/2019   HDL 28.40 (L) 02/07/2019   LDLCALC 39 02/07/2019   LDLDIRECT 72.0 12/08/2016   TRIG 180.0 (H) 02/07/2019   CHOLHDL 4 02/07/2019   Lab Results  Component Value Date   CREATININE 1.37 02/07/2019   BUN 19 02/07/2019   NA 137 02/07/2019   K 4.5 02/07/2019   CL 103 02/07/2019   CO2 27 02/07/2019    ASSESSMENT/PLAN    Problem List Items Addressed This Visit    Angina, class III (HCC) (Chronic)    This was his presenting symptom.  He had two-vessel PCI.  After having his PUD treated, any residual chest discomfort was resolved.  He initially was upset because his symptoms did not go away right away, but now they are completely resolved.  He is quite active and has no symptoms.      CAD S/P PCI: p-mLAD Synergy DES 2.75 x 16 (3.1-2.9 mm), p-mRI Synergy DES 3.0 x 12 (3.3 mm) (Chronic)    Over 2 years out from DES PCI to the LAD and Ramus.  No recurrent anginal symptoms.  Not on beta-blocker because of bradycardia.  He is on statin and ARB.  Was on aspirin and Plavix, aspirin was stopped due to PUD issues.  On maintenance dose Plavix because of two-vessel PCI.  Okay to hold Plavix 5-7 days preop for surgeries or procedures.  --> He has an  upcoming colonoscopy with Dr. Henrene Pastor.  Should be fine holding his Plavix.  This is a low risk procedure and he has not had any active anginal symptoms.  No need for further cardiac evaluation.      Relevant Orders   EKG 12-Lead   Hyperlipidemia with target low density lipoprotein (LDL) cholesterol less than 70 mg/dL - Primary (Chronic)    He remains on rosuvastatin 20 mg daily.  LDL is 39.  Excellent.  Notably improved from 2 years ago.  Being followed up with labs in May.      Essential hypertension (Chronic)    Blood pressure looks good.  Well-controlled on ARB. Decided against beta-blocker because of fatigue issues and borderline bradycardia.      Fluttering sensation of heart    Pretty much resolved after PCI.      Presence of drug coated stent in LAD coronary artery   Relevant Orders   EKG 12-Lead       COVID-19 Education: The signs and symptoms of COVID-19 were discussed with the patient and how to seek care for testing (follow up with PCP or arrange E-visit).   The importance of social distancing was discussed today.  I spent a total  of 18 minutes with the patient and chart review. >  50% of the time was spent in direct patient consultation.  Additional time spent with chart review (studies, outside notes, etc): 8 Total Time: 26 min   Current medicines are reviewed at length with the patient today.  (+/- concerns) -asked about being able to hold Plavix for colonoscopy.   Patient Instructions / Medication Changes & Studies & Tests Ordered   Patient Instructions  Medication Instructions:  No changes *If you need a refill on your cardiac medications before your next appointment, please call your pharmacy*  Lab Work: Not needed   Testing/Procedures: Not needed  Follow-Up: At [facility], you and your health needs are our priority.  As part of our continuing mission to provide you with exceptional heart care, we have created designated Provider Care Teams.   These Care Teams include your primary Cardiologist (physician) and Advanced Practice Providers (APPs -  Physician Assistants and Nurse Practitioners) who all work together to provide you with the care you need, when you need it.  Your next appointment:   12 month(s)  The format for your next appointment:   In Person  Provider:   Glenetta Hew, MD  Other Instructions  okay to have colonoscopy and can hold plavix 5 to 7 days prior    Studies Ordered:   Orders Placed This Encounter  Procedures  . EKG 12-Lead     Glenetta Hew, M.D., M.S. Interventional Cardiologist   Pager # [phone] Phone # [phone] [address] [place], [place] [address]   Thank you for choosing Heartcare at [facility]!!

## 2019-09-01 NOTE — Assessment & Plan Note (Signed)
Over 2 years out from DES PCI to the LAD and Ramus.  No recurrent anginal symptoms.  Not on beta-blocker because of bradycardia.  He is on statin and ARB.  Was on aspirin and Plavix, aspirin was stopped due to PUD issues.  On maintenance dose Plavix because of two-vessel PCI.  Okay to hold Plavix 5-7 days preop for surgeries or procedures.  --> He has an upcoming colonoscopy with Dr. Henrene Pastor.  Should be fine holding his Plavix.  This is a low risk procedure and he has not had any active anginal symptoms.  No need for further cardiac evaluation.

## 2019-09-01 NOTE — Assessment & Plan Note (Signed)
Pretty much resolved after PCI.

## 2019-09-01 NOTE — Assessment & Plan Note (Signed)
Blood pressure looks good.  Well-controlled on ARB. Decided against beta-blocker because of fatigue issues and borderline bradycardia.

## 2019-09-01 NOTE — Assessment & Plan Note (Signed)
This was his presenting symptom.  He had two-vessel PCI.  After having his PUD treated, any residual chest discomfort was resolved.  He initially was upset because his symptoms did not go away right away, but now they are completely resolved.  He is quite active and has no symptoms.

## 2019-09-01 NOTE — Telephone Encounter (Signed)
The nurse practioner Urban Gibson) will call patient to let him know it is ok to hold his Plavix for 5 days prior to his procedure.

## 2019-09-01 NOTE — Telephone Encounter (Signed)
Full note pending  But yes he is okay to hold Plavix.  Glenetta Hew, MD

## 2019-09-01 NOTE — Telephone Encounter (Signed)
Hi Dr. Ellyn Hack,   [place] saw Thomas Spencer yesterday for preoperative clearance. He is scheduled for endoscopy 09/06/19. Wanted to confirm if he was able to continue with the procedure and stop his Plavix? He would need to stop today.  Please route your response to 'CV DIV PREOP'.   Thanks, Loel Dubonnet, NP

## 2019-09-01 NOTE — Telephone Encounter (Signed)
Just wanted to follow up on patient's appointment yesterday.  He will need to stop his Plavix today if he is to have his colonoscopy next week.  Please keep me posted.  Thanks!

## 2019-09-01 NOTE — Assessment & Plan Note (Signed)
He remains on rosuvastatin 20 mg daily.  LDL is 39.  Excellent.  Notably improved from 2 years ago.  Being followed up with labs in May.

## 2019-09-01 NOTE — Telephone Encounter (Signed)
   Primary Cardiologist: Glenetta Hew, MD  Chart reviewed as part of pre-operative protocol coverage. Given past medical history and time since last visit, based on ACC/AHA guidelines, Thomas Spencer would be at acceptable risk for the planned procedure without further cardiovascular testing.   He was seen by Dr. Ellyn Hack 08/31/19. Recommended for annual follow up. His after visit summary noted that he could hold Plavix 5 days prior to his scheduled endoscopy/conoloscopy on 09/06/19. Spoke with [name] and he verbalized understanding to hold his Plavix 5 days prior to procedure.  I will route this recommendation to the requesting party via Epic fax function and remove from pre-op pool.  Please call with questions.  Loel Dubonnet, NP 09/01/2019, 3:28 PM

## 2019-09-01 NOTE — Telephone Encounter (Signed)
I will route to Pre OP Provider today Laurann Montana to check Dr. Allison Quarry note for clearance

## 2019-09-02 ENCOUNTER — Encounter: Payer: Self-pay | Admitting: Cardiology

## 2019-09-04 ENCOUNTER — Encounter: Payer: Self-pay | Admitting: Family Medicine

## 2019-09-04 ENCOUNTER — Ambulatory Visit (INDEPENDENT_AMBULATORY_CARE_PROVIDER_SITE_OTHER): Payer: 59

## 2019-09-04 DIAGNOSIS — Z1159 Encounter for screening for other viral diseases: Secondary | ICD-10-CM

## 2019-09-05 LAB — SARS CORONAVIRUS 2 (TAT 6-24 HRS): SARS Coronavirus 2: NEGATIVE

## 2019-09-06 ENCOUNTER — Other Ambulatory Visit: Payer: Self-pay

## 2019-09-06 ENCOUNTER — Ambulatory Visit (AMBULATORY_SURGERY_CENTER): Payer: 59 | Admitting: Internal Medicine

## 2019-09-06 ENCOUNTER — Encounter: Payer: Self-pay | Admitting: Internal Medicine

## 2019-09-06 VITALS — BP 123/75 | HR 77 | Temp 98.3°F | Resp 11 | Ht 64.0 in | Wt 169.0 lb

## 2019-09-06 DIAGNOSIS — Z86010 Personal history of colonic polyps: Secondary | ICD-10-CM | POA: Diagnosis present

## 2019-09-06 DIAGNOSIS — D120 Benign neoplasm of cecum: Secondary | ICD-10-CM

## 2019-09-06 MED ORDER — SODIUM CHLORIDE 0.9 % IV SOLN: 500.0000 mL | INTRAVENOUS | Status: DC

## 2019-09-06 NOTE — Progress Notes (Signed)
Temp-LC VS-DT 

## 2019-09-06 NOTE — Progress Notes (Signed)
Called to room to assist during endoscopic procedure.  Patient ID and intended procedure confirmed with present staff. Received instructions for my participation in the procedure from the performing physician.  

## 2019-09-06 NOTE — Progress Notes (Signed)
A and O x3. Report to RN. Tolerated MAC anesthesia well.

## 2019-09-06 NOTE — Op Note (Signed)
[facility] Patient Name: Thomas Spencer Procedure Date: 09/06/2019 4:02 PM MRN: [id number] Endoscopist: Docia Chuck. Henrene Pastor , MD Age: 53 Referring MD:  Date of Birth: [birth year] Gender: Male Account #: [id number] Procedure:                Colonoscopy with cold snare polypectomy x 2 Indications:              High risk colon cancer surveillance: Personal                            history of non-advanced adenoma. Previous                            examination November 2014 (Dr. Earle Gell) Medicines:                Monitored Anesthesia Care Procedure:                Pre-Anesthesia Assessment:                           - Prior to the procedure, a History and Physical                            was performed, and patient medications and                            allergies were reviewed. The patient's tolerance of                            previous anesthesia was also reviewed. The risks                            and benefits of the procedure and the sedation                            options and risks were discussed with the patient.                            All questions were answered, and informed consent                            was obtained. Prior Anticoagulants: The patient has                            taken Plavix (clopidogrel), last dose was 6 days                            prior to procedure. ASA Grade Assessment: III - A                            patient with severe systemic disease. After                            reviewing the risks and benefits, the patient was  deemed in satisfactory condition to undergo the                            procedure.                           After obtaining informed consent, the colonoscope                            was passed under direct vision. Throughout the                            procedure, the patient's blood pressure, pulse, and                            oxygen saturations were  monitored continuously. The                            Colonoscope was introduced through the anus and                            advanced to the the cecum, identified by                            appendiceal orifice and ileocecal valve. The                            ileocecal valve, appendiceal orifice, and rectum                            were photographed. The quality of the bowel                            preparation was excellent. The colonoscopy was                            performed without difficulty. The patient tolerated                            the procedure well. The bowel preparation used was                            SUPREP via split dose instruction. Scope In: 4:14:51 PM Scope Out: 4:29:09 PM Scope Withdrawal Time: 0 hours 12 minutes 29 seconds  Total Procedure Duration: 0 hours 14 minutes 18 seconds  Findings:                 Two polyps were found in the cecum. The polyps were                            1 to 5 mm in size. These polyps were removed with a                            cold snare. Resection and retrieval were complete.  Multiple diverticula were found in the sigmoid                            colon.                           The exam was otherwise without abnormality on                            direct and retroflexion views. Complications:            No immediate complications. Estimated blood loss:                            None. Estimated Blood Loss:     Estimated blood loss: none. Impression:               - Two 1 to 5 mm polyps in the cecum, removed with a                            cold snare. Resected and retrieved.                           - Diverticulosis in the sigmoid colon.                           - The examination was otherwise normal on direct                            and retroflexion views. Recommendation:           - Repeat colonoscopy in 5 years for surveillance.                           - Resume  Plavix (clopidogrel) today at prior dose.                           - Patient has a contact number available for                            emergencies. The signs and symptoms of potential                            delayed complications were discussed with the                            patient. Return to normal activities tomorrow.                            Written discharge instructions were provided to the                            patient.                           - Resume previous diet.                           -  Continue present medications.                           - Await pathology results. Docia Chuck. Henrene Pastor, MD 09/06/2019 4:35:06 PM This report has been signed electronically.

## 2019-09-06 NOTE — Patient Instructions (Signed)
2 POLYPS REMOVED TODAY.  YOU WILL RECEIVE A LETTER IN THE MAIL FROM DR PERRY IN ABOUT 2 WEEKS EXPLAINING THE PATHOLOGY. NEXT COLONOSCOPY IN 5 YEARS. DIVERTICULOSIS NOTED TODAY. RESUME PLAVIX AT PRIOR DOSE TODAY.    YOU HAD AN ENDOSCOPIC PROCEDURE TODAY AT [facility]:   Refer to the procedure report that was given to you for any specific questions about what was found during the examination.  If the procedure report does not answer your questions, please call your gastroenterologist to clarify.  If you requested that your care partner not be given the details of your procedure findings, then the procedure report has been included in a sealed envelope for you to review at your convenience later.  YOU SHOULD EXPECT: Some feelings of bloating in the abdomen. Passage of more gas than usual.  Walking can help get rid of the air that was put into your GI tract during the procedure and reduce the bloating. If you had a lower endoscopy (such as a colonoscopy or flexible sigmoidoscopy) you may notice spotting of blood in your stool or on the toilet paper. If you underwent a bowel prep for your procedure, you may not have a normal bowel movement for a few days.  Please Note:  You might notice some irritation and congestion in your nose or some drainage.  This is from the oxygen used during your procedure.  There is no need for concern and it should clear up in a day or so.  SYMPTOMS TO REPORT IMMEDIATELY:   Following lower endoscopy (colonoscopy or flexible sigmoidoscopy):  Excessive amounts of blood in the stool  Significant tenderness or worsening of abdominal pains  Swelling of the abdomen that is new, acute  Fever of 100F or higher   For urgent or emergent issues, a gastroenterologist can be reached at any hour by calling [phone].   DIET:  We do recommend a small meal at first, but then you may proceed to your regular diet.  Drink plenty of fluids but you should avoid  alcoholic beverages for 24 hours.  ACTIVITY:  You should plan to take it easy for the rest of today and you should NOT DRIVE or use heavy machinery until tomorrow (because of the sedation medicines used during the test).    FOLLOW UP: Our staff will call the number listed on your records 48-72 hours following your procedure to check on you and address any questions or concerns that you may have regarding the information given to you following your procedure. If we do not reach you, we will leave a message.  We will attempt to reach you two times.  During this call, we will ask if you have developed any symptoms of COVID 19. If you develop any symptoms (ie: fever, flu-like symptoms, shortness of breath, cough etc.) before then, please call [phone].  If you test positive for Covid 19 in the 2 weeks post procedure, please call and report this information to [place].    If any biopsies were taken you will be contacted by phone or by letter within the next 1-3 weeks.  Please call us at [phone] if you have not heard about the biopsies in 3 weeks.    SIGNATURES/CONFIDENTIALITY: You and/or your care partner have signed paperwork which will be entered into your electronic medical record.  These signatures attest to the fact that that the information above on your After Visit Summary has been reviewed and is understood.  Full responsibility  of the confidentiality of this discharge information lies with you and/or your care-partner.

## 2019-09-11 ENCOUNTER — Telehealth: Payer: Self-pay | Admitting: *Deleted

## 2019-09-11 NOTE — Telephone Encounter (Signed)
  Follow up Call-  Call back number 09/06/2019  Post procedure Call Back phone  # [phone]  Permission to leave phone message Yes  Some recent data might be hidden     Patient questions:  Do you have a fever, pain , or abdominal swelling? No. Pain Score  0 *  Have you tolerated food without any problems? Yes.    Have you been able to return to your normal activities? Yes.    Do you have any questions about your discharge instructions: Diet   No. Medications  No. Follow up visit  No.  Do you have questions or concerns about your Care? No.  Actions: * If pain score is 4 or above: No action needed, pain <4.  1. Have you developed a fever since your procedure? no  2.   Have you had an respiratory symptoms (SOB or cough) since your procedure? no  3.   Have you tested positive for COVID 19 since your procedure no  4.   Have you had any family members/close contacts diagnosed with the COVID 19 since your procedure?  no   If yes to any of these questions please route to Joylene John, RN and Alphonsa Gin, Therapist, sports.

## 2019-09-15 DIAGNOSIS — N503 Cyst of epididymis: Secondary | ICD-10-CM

## 2019-09-15 HISTORY — DX: Cyst of epididymis: N50.3

## 2019-09-18 ENCOUNTER — Encounter: Payer: Self-pay | Admitting: Internal Medicine

## 2019-09-19 ENCOUNTER — Other Ambulatory Visit: Payer: Self-pay

## 2019-09-19 ENCOUNTER — Ambulatory Visit (INDEPENDENT_AMBULATORY_CARE_PROVIDER_SITE_OTHER): Payer: 59 | Admitting: Family Medicine

## 2019-09-19 ENCOUNTER — Encounter: Payer: Self-pay | Admitting: Family Medicine

## 2019-09-19 VITALS — BP 152/92 | HR 116 | Temp 98.2°F | Resp 16 | Ht 64.0 in | Wt 168.6 lb

## 2019-09-19 DIAGNOSIS — I10 Essential (primary) hypertension: Secondary | ICD-10-CM | POA: Diagnosis not present

## 2019-09-19 DIAGNOSIS — D500 Iron deficiency anemia secondary to blood loss (chronic): Secondary | ICD-10-CM | POA: Diagnosis not present

## 2019-09-19 DIAGNOSIS — N5089 Other specified disorders of the male genital organs: Secondary | ICD-10-CM

## 2019-09-19 NOTE — Progress Notes (Signed)
OFFICE VISIT  09/19/2019   CC:  Chief Complaint  Patient presents with  . Follow-up    RCI, pt is not fasting   HPI:    Patient is a 54 y.o.  male who presents for f/u HTN. He has CAD and is s/p stent x 15 Apr 2017.  On 09/06/19 he got a repeat colonoscopy, 2 polyps removed, recall 5 yrs (Dr. Henrene Pastor).  BP a little up today in office. HOme bp's consistently <130/80.  Didn't take med yet today.  He has a lump on right testicle x years. Getting a little bigger starting a few months ago, has a little bit of burning in testicle area chronically--"always in the background". Denies hx of epididymitis or orchitis in the past. No hx of STD.  No color change or heat to the R scrotal area.  Also with dull pain in RLB extending around R hip to R anterior thigh.  No tingle or numbness.   Past Medical History:  Diagnosis Date  . Acute epiglottitis 01/2014  . BPH with obstruction/lower urinary tract symptoms    [facility] referral done 01/2011 (Dr. Risa Grill); ultimately a prostate biopsy was done for increased PSA velocity--April 2013--and it  was completely normal.  . CAD S/P percutaneous coronary angioplasty 05/05/2017   Progressive Angina: (3 V CAD on Cor Calcium Score) --> LEFT HEART CATH & CORONARY ANGIOGRAPHY  / CORONARY STENT INTERVENTION Conclusion  LESION #1: p-mLAD to Mid LAD 80 %s --> PCI SYNERGY DES 2.75X16 - Tapered post-dilation: 3.1-2.9 mm.  LESION #2: pRI 85 % - PCI SYNERGY DES 3X12.  Initial plan ASA/Plavix x 1 yr  --> ASA stopped 06/2017 for GI Bleed-- on Plavix alone and continued w/this 11/2017  . Elevated coronary artery calcium score 03/2017   indicating possible 3 vessel CAD: cardiac cath per Dr. Ellyn Hack 8/22 revealed 2 Vessel obstructive CAD in LAD & RI   . Elevated liver function tests    mild  . Erectile dysfunction   . Family history of colon cancer    father  . Family history of hypertension    strong family history  . Family history of premature coronary heart  disease    strong family history; Mother (MI in 103s, then 69s, s/p Valve Sgx in 60s) & oldest Brother (CABG x 4 in 46s)  . History of sciatica 11/2013   responded well to prednisone burst 11/2013  . Hx of colonic polyp 2007; 2014; 08/2019   Dr. Henrene Pastor did 2020 scope.  Recall 2025.  Marland Kitchen Hyperlipidemia    On 40 mg Crestor as of 06/2017  . Hypertension    On telmisartan  . Iron deficiency anemia 06/2017   Hb drop from 15 down to 11 from 04/2017-06/2017.  +H pylori gastritis+.  Recurrence 01/2019 +correlation with melena and fatigue.  Pt has been back on iron x 2 wks and is making arrangements to see his GI MD as of 02/08/19 (? repeat EGD + is overdue for repeat colonoscopy).  . Prediabetes 11/2016   HbA1c 6.1%     Past Surgical History:  Procedure Laterality Date  . COLONOSCOPY W/ POLYPECTOMY  09/02/06; 07/2013; 08/2019   2014 [facility]: adenomatous polyp w/out high grade dysplasia or malignancy.  08/2019 adenoma x 2, diverticulosis, recall 5 yrs.  . coronary calcium score     Very high risk: as of 03/2017 pt is to return to discuss with Dr. Ellyn Hack: stress test vs cath as next step.  . CORONARY STENT INTERVENTION N/A  05/05/2017   DES x 2. Preserved LV function.  Procedure: CORONARY STENT INTERVENTION;  Surgeon: Leonie Man, MD;  Location: [facility];  Service: Cardiovascular;: CAD S/P PCI: p-mLAD Synergy DES 2.75 x 16 (3.1-2.9 mm), p-mRI Synergy DES 3.0 x 12 (3.3 mm)  . dental implants    . ESOPHAGOGASTRODUODENOSCOPY  07/08/2017   GI RECOMMENDED D/C ASPIRIN:  superfic non-bleeding ulcers ranging 5-71mm in size in antrum.  +H pylori +.  A few non-bleeding erosions in pre-pyloric region.  Normal duodenum.  Marland Kitchen LEFT HEART CATH AND CORONARY ANGIOGRAPHY N/A 05/05/2017   Procedure: LEFT HEART CATH AND CORONARY ANGIOGRAPHY;  Surgeon: Leonie Man, MD;  Location: [facility];  Service: Cardiovascular;  Laterality: N/A; p-m LAD 80%, mRI 90% --> 2 vessel PCI  . PROSTATE BIOPSY  01/2012    Normal    Outpatient Medications Prior to Visit  Medication Sig Dispense Refill  . clopidogrel (PLAVIX) 75 MG tablet Take 1 tablet (75 mg total) by mouth daily with breakfast. OFFICE VISIT NEEDED 90 tablet 0  . ferrous sulfate 325 (65 FE) MG tablet Take 1 tablet by mouth daily.    . Multiple Vitamin (MULTIVITAMIN WITH MINERALS) TABS tablet Take 1 tablet by mouth daily.    . pantoprazole (PROTONIX) 40 MG tablet TAKE 1 TABLET BY MOUTH TWICE A DAY 60 tablet 1  . rosuvastatin (CRESTOR) 20 MG tablet Take 1 tablet (20 mg total) by mouth daily. 90 tablet 0  . sildenafil (VIAGRA) 100 MG tablet Take 1 tablet (100 mg total) by mouth daily as needed for erectile dysfunction. 6 tablet 2  . telmisartan (MICARDIS) 40 MG tablet Take 1 tablet (40 mg total) by mouth daily. OFFICE VISIT NEEDED 90 tablet 0   No facility-administered medications prior to visit.    Allergies  Allergen Reactions  . Ace Inhibitors Cough  . Shrimp [Shellfish Allergy] Other (See Comments)    Swelling around eyes/lips---can eat IF cooked WELL    ROS As per HPI  PE: Blood pressure (!) 152/92, pulse (!) 116, temperature 98.2 F (36.8 C), temperature source Temporal, resp. rate 16, height 5\' 4"  (1.626 m), weight 168 lb 9.6 oz (76.5 kg), SpO2 96 %. Gen: Alert, well appearing.  Patient is oriented to person, place, time, and situation. Genitals normal; both testes normal without tenderness or mass.  Lateral to R testicle there is an approx 1.5 cm x 1.5 cm round rubbery nodule that feels separate from any palpable vessels, also separate from epididymus.  Shaft normal, uncircumcised, meatus normal without discharge. No inguinal hernia noted. No inguinal lymphadenopathy.   LABS:    Chemistry      Component Value Date/Time   NA 137 02/07/2019 1102   NA 138 04/20/2017 0843   K 4.5 02/07/2019 1102   CL 103 02/07/2019 1102   CO2 27 02/07/2019 1102   BUN 19 02/07/2019 1102   BUN 19 04/20/2017 0843   CREATININE 1.37 02/07/2019  1102      Component Value Date/Time   CALCIUM 8.9 02/07/2019 1102   ALKPHOS 52 02/07/2019 1102   AST 23 02/07/2019 1102   ALT 32 02/07/2019 1102   BILITOT 0.3 02/07/2019 1102     Lab Results  Component Value Date   WBC 3.0 (L) 02/07/2019   HGB 11.0 (L) 02/07/2019   HCT 31.6 (L) 02/07/2019   MCV 95.9 02/07/2019   PLT 215.0 02/07/2019   Lab Results  Component Value Date   IRON 29 (L) 02/07/2019  TIBC 332 02/07/2019   FERRITIN 17 (L) 02/07/2019   Lab Results  Component Value Date   CHOL 104 02/07/2019   HDL 28.40 (L) 02/07/2019   LDLCALC 39 02/07/2019   LDLDIRECT 72.0 12/08/2016   TRIG 180.0 (H) 02/07/2019   CHOLHDL 4 02/07/2019   Lab Results  Component Value Date   HGBA1C 5.2 02/07/2019     IMPRESSION AND PLAN:  1) R sided scrotal mass, chronic, enlarging slowly. Sebaceous cyst vs calcified varicocele or large epididymal cyst.  Low suspicion of malignancy-->does not feel attached to testicle at all.  The nodule is nontender and is mobile.  No induration. Ordered scrotal u/s to further assess.  2) HTN: good control on home monitoring.  Elevated today in office->he didn't take med yet today.  3) Iron def anemia due to chronic blood loss: UGI bleed documented.  No melena.  Takes one iron tab daily. No need for repeat labs at this visit.  No labs today: will repeat CBC, iron panel, and all usual CPE labs at next CPE, which he'll arrange at his convenience. No med changes today.  An After Visit Summary was printed and given to the patient.  FOLLOW UP: Return for pt to schedule annual CPE at his convenience.  Signed:  Crissie Sickles, MD           09/19/2019

## 2019-09-20 ENCOUNTER — Telehealth: Payer: Self-pay

## 2019-09-20 NOTE — Telephone Encounter (Signed)
Diane, can you "edit" the order for me? I do not want doppler as part of the ultrasound.-thx

## 2019-09-20 NOTE — Telephone Encounter (Signed)
Received VM from [place] at [facility] wanting to know if you wanted the US scrotum with or without doppler, per their protocol. If so, please edit order and put "with or without doppler" in the comments.  Please advise, thanks.

## 2019-09-26 ENCOUNTER — Encounter: Payer: Self-pay | Admitting: Family Medicine

## 2019-10-04 ENCOUNTER — Other Ambulatory Visit: Payer: 59

## 2019-10-05 ENCOUNTER — Ambulatory Visit
Admission: RE | Admit: 2019-10-05 | Discharge: 2019-10-05 | Disposition: A | Discharge status: Home / Self Care | Payer: 59 | Source: Ambulatory Visit | Attending: Family Medicine | Admitting: Family Medicine

## 2019-10-05 DIAGNOSIS — N5089 Other specified disorders of the male genital organs: Secondary | ICD-10-CM

## 2019-10-06 ENCOUNTER — Encounter: Payer: Self-pay | Admitting: Family Medicine

## 2019-10-12 ENCOUNTER — Ambulatory Visit: Payer: 59 | Admitting: Internal Medicine

## 2019-10-14 ENCOUNTER — Other Ambulatory Visit: Payer: Self-pay | Admitting: Family Medicine

## 2019-11-08 ENCOUNTER — Telehealth: Payer: Self-pay | Admitting: Cardiology

## 2019-11-08 NOTE — Telephone Encounter (Signed)
Pt c/o of Chest Pain: STAT if CP now or developed within 24 hours  1. Are you having CP right now? Minor   2. Are you experiencing any other symptoms (ex. SOB, nausea, vomiting, sweating)? No  3. How long have you been experiencing CP? 3 weeks   4. Is your CP continuous or coming and going? Coming and Going   5. Have you taken Nitroglycerin? No, doesn't have any.   Thomas Spencer is calling stating he has been experiencing CP off and on for the last 3 weeks. He states he is not experiencing any other symptoms along with the CP. The patient does not have any Nitroglycerin in order to take it regarding this. Thomas Spencer has an appointment scheduled with Dr. Ellyn Hack for this on 12/20/19. He is requesting a sooner appt, but only wants to see Dr. Ellyn Hack. His appt has been added to the wait list. Please advise.  ?

## 2019-11-08 NOTE — Telephone Encounter (Signed)
Spoke with pt who report he's been experiencing off and on CP x 3 weeks. He describes symptoms as light pressure and voiced he is experiencing symptoms now. He denies SOB or any other symptoms.  Nurse advise pt to report to ER for further evaluations. Pt declined and voiced he doesn't feel symptoms are worse enough to seek emergent care. Nurse reiterated the importance of going to the ER but pt continues to decline. Appointment scheduled for first available on Friday 2/25 with Almyra Deforest, PA.

## 2019-11-10 ENCOUNTER — Encounter: Payer: Self-pay | Admitting: Physician Assistant

## 2019-11-10 ENCOUNTER — Other Ambulatory Visit: Payer: Self-pay

## 2019-11-10 ENCOUNTER — Ambulatory Visit (INDEPENDENT_AMBULATORY_CARE_PROVIDER_SITE_OTHER): Payer: 59 | Admitting: Physician Assistant

## 2019-11-10 VITALS — BP 130/92 | HR 64 | Ht 65.0 in | Wt 166.8 lb

## 2019-11-10 DIAGNOSIS — I2510 Atherosclerotic heart disease of native coronary artery without angina pectoris: Secondary | ICD-10-CM | POA: Diagnosis not present

## 2019-11-10 DIAGNOSIS — Z9861 Coronary angioplasty status: Secondary | ICD-10-CM | POA: Diagnosis not present

## 2019-11-10 DIAGNOSIS — E785 Hyperlipidemia, unspecified: Secondary | ICD-10-CM | POA: Diagnosis not present

## 2019-11-10 DIAGNOSIS — I10 Essential (primary) hypertension: Secondary | ICD-10-CM

## 2019-11-10 DIAGNOSIS — R079 Chest pain, unspecified: Secondary | ICD-10-CM

## 2019-11-10 DIAGNOSIS — R7303 Prediabetes: Secondary | ICD-10-CM

## 2019-11-10 NOTE — Patient Instructions (Signed)
Medication Instructions:  Your physician recommends that you continue on your current medications as directed. Please refer to the Current Medication list given to you today.  *If you need a refill on your cardiac medications before your next appointment, please call your pharmacy*  Testing/Procedures: Isaac Laud PA has ordered a Lexiscan Myocardial Perfusion Imaging Study.  Please arrive 15 minutes prior to your appointment time for registration and insurance purposes.   The test will take approximately 3 to 4 hours to complete; you may bring reading material.  If someone comes with you to your appointment, they will need to remain in the main lobby due to limited space in the testing area.   How to prepare for your Myocardial Perfusion Test:  Do not eat or drink 3 hours prior to your test, except you may have water.  Do not consume products containing caffeine (regular or decaffeinated) 12 hours prior to your test. (ex: coffee, chocolate, sodas, tea).  Do wear comfortable clothes (no dresses or overalls) and walking shoes, tennis shoes preferred (No heels or open toe shoes are allowed).  Do NOT wear cologne, perfume, aftershave, or lotions (deodorant is allowed).  If you use an inhaler, use it the AM of your test and bring it with you.   If you use a nebulizer, use it the AM of your test.   If these instructions are not followed, your test will have to be rescheduled.    Follow-Up: At [facility], you and your health needs are our priority.  As part of our continuing mission to provide you with exceptional heart care, we have created designated Provider Care Teams.  These Care Teams include your primary Cardiologist (physician) and Advanced Practice Providers (APPs -  Physician Assistants and Nurse Practitioners) who all work together to provide you with the care you need, when you need it.  We recommend signing up for the patient portal called "MyChart".  Sign up information is  provided on this After Visit Summary.  MyChart is used to connect with patients for Virtual Visits (Telemedicine).  Patients are able to view lab/test results, encounter notes, upcoming appointments, etc.  Non-urgent messages can be sent to your provider as well.   To learn more about what you can do with MyChart, go to [email or web address].    Your next appointment:   6 month(s)  The format for your next appointment:   In Person  Provider:   You may see Glenetta Hew, MD or one of the following Advanced Practice Providers on your designated Care Team:    Rosaria Ferries, PA-C  Jory Sims, DNP, ANP  Cadence Kathlen Mody, NP    Other Instructions

## 2019-11-10 NOTE — Progress Notes (Signed)
Cardiology Office Note:    Date:  11/12/2019   ID:  Thomas Spencer, DOB 05/30/1966, MRN [id number]  PCP:  Tammi Sou, MD  Cardiologist:  Glenetta Hew, MD  Electrophysiologist:  None   Referring MD: Tammi Sou, MD   Chief Complaint  Patient presents with  . Follow-up    seen for Dr. Ellyn Hack.     History of Present Illness:    Thomas Spencer is a 54 y.o. 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  He is not on beta-blocker due to bradycardia and fatigue.  Cardiac catheterization performed in August 2018 demonstrated 80% lesion in proximal LAD treated with synergy DES, 85% proximal RI lesion treated with 3 x 12 mm Synergy DES.  The initial plan is to continue aspirin and Plavix for 1 year, however aspirin stopped in October 2018 due to GI bleed.  He underwent treatment for peptic ulcer disease.  Afterward, he continued on Plavix monotherapy.  Patient was last seen by Dr. Ellyn Hack in December 2020 at which time he was doing well.  Patient presents today for cardiology office visit.  Over the past few weeks, he has been noticing intermittent chest discomfort which he described as a pulling sensation in the left side of the chest.  This sensation can last up to 15 minutes at a time.  Unlike the previous angina, he denies any obvious exertional dyspnea or sweating.  The current chest discomfort is also atypical when compared to the previous anginal symptom which did not have obvious chest pain but more dyspnea in nature.  I recommend a Lexiscan Myoview for further evaluation.  Otherwise he has no lower extremity edema, orthopnea or PND.   Past Medical History:  Diagnosis Date  . Acute epiglottitis 01/2014  . BPH with obstruction/lower urinary tract symptoms    [facility] referral done 01/2011 (Dr. Risa Grill); ultimately a prostate biopsy was done for increased PSA velocity--April 2013--and  it  was completely normal.  . CAD S/P percutaneous coronary angioplasty 05/05/2017   Progressive Angina: (3 V CAD on Cor Calcium Score) --> LEFT HEART CATH & CORONARY ANGIOGRAPHY  / CORONARY STENT INTERVENTION Conclusion  LESION #1: p-mLAD to Mid LAD 80 %s --> PCI SYNERGY DES 2.75X16 - Tapered post-dilation: 3.1-2.9 mm.  LESION #2: pRI 85 % - PCI SYNERGY DES 3X12.  Initial plan ASA/Plavix x 1 yr  --> ASA stopped 06/2017 for GI Bleed-- on Plavix alone and continued w/this 11/2017  . Elevated coronary artery calcium score 03/2017   indicating possible 3 vessel CAD: cardiac cath per Dr. Ellyn Hack 8/22 revealed 2 Vessel obstructive CAD in LAD & RI   . Elevated liver function tests    mild  . Epididymal cyst 09/2019   Right (vs spermatocele.  Hematocele less likely since no hx of trauma).  . Erectile dysfunction   . Family history of colon cancer    father  . Family history of hypertension    strong family history  . Family history of premature coronary heart disease    strong family history; Mother (MI in 59s, then 52s, s/p Valve Sgx in 96s) & oldest Brother (CABG x 4 in 10s)  . History of adenomatous polyp of colon 2007; 2014; 08/2019   Dr. Henrene Pastor did 2020 scope.  Recall 2025.  Marland Kitchen History of sciatica 11/2013   responded well to prednisone burst 11/2013  . Hyperlipidemia  On 40 mg Crestor as of 06/2017  . Hypertension    On telmisartan  . Iron deficiency anemia 06/2017   Hb drop from 15 down to 11 from 04/2017-06/2017.  +H pylori gastritis+.  Recurrence 01/2019 +correlation with melena and fatigue.  Pt has been back on iron x 2 wks and is making arrangements to see his GI MD as of 02/08/19 (? repeat EGD + is overdue for repeat colonoscopy).  . Prediabetes 11/2016   HbA1c 6.1%     Past Surgical History:  Procedure Laterality Date  . COLONOSCOPY W/ POLYPECTOMY  09/02/06; 07/2013; 08/2019   2014 [facility]: adenomatous polyp w/out high grade dysplasia or malignancy.  08/2019 adenoma x 2,  diverticulosis, recall 5 yrs.  . coronary calcium score     Very high risk: as of 03/2017 pt is to return to discuss with Dr. Ellyn Hack: stress test vs cath as next step.  . CORONARY STENT INTERVENTION N/A 05/05/2017   DES x 2. Preserved LV function.  Procedure: CORONARY STENT INTERVENTION;  Surgeon: Leonie Man, MD;  Location: [facility];  Service: Cardiovascular;: CAD S/P PCI: p-mLAD Synergy DES 2.75 x 16 (3.1-2.9 mm), p-mRI Synergy DES 3.0 x 12 (3.3 mm)  . dental implants    . ESOPHAGOGASTRODUODENOSCOPY  07/08/2017   GI RECOMMENDED D/C ASPIRIN:  superfic non-bleeding ulcers ranging 5-11mm in size in antrum.  +H pylori +.  A few non-bleeding erosions in pre-pyloric region.  Normal duodenum.  Marland Kitchen LEFT HEART CATH AND CORONARY ANGIOGRAPHY N/A 05/05/2017   Procedure: LEFT HEART CATH AND CORONARY ANGIOGRAPHY;  Surgeon: Leonie Man, MD;  Location: [facility];  Service: Cardiovascular;  Laterality: N/A; p-m LAD 80%, mRI 90% --> 2 vessel PCI  . PROSTATE BIOPSY  01/2012   Normal    Current Medications: Current Meds  Medication Sig  . clopidogrel (PLAVIX) 75 MG tablet Take 1 tablet (75 mg total) by mouth daily with breakfast. OFFICE VISIT NEEDED  . Multiple Vitamin (MULTIVITAMIN WITH MINERALS) TABS tablet Take 1 tablet by mouth daily.  . pantoprazole (PROTONIX) 40 MG tablet TAKE 1 TABLET BY MOUTH TWICE A DAY  . rosuvastatin (CRESTOR) 20 MG tablet Take 1 tablet (20 mg total) by mouth daily.  . sildenafil (VIAGRA) 100 MG tablet Take 1 tablet (100 mg total) by mouth daily as needed for erectile dysfunction.  Marland Kitchen telmisartan (MICARDIS) 40 MG tablet Take 1 tablet (40 mg total) by mouth daily. OFFICE VISIT NEEDED     Allergies:   Ace inhibitors and Shrimp [shellfish allergy]   Social History   Socioeconomic History  . Marital status: Married    Spouse name: Not on file  . Number of children: 2  . Years of education: Not on file  . Highest education level: Not on file  Occupational  History  . Occupation: Personal assistant    Comment: [employer].  Tobacco Use  . Smoking status: Never Smoker  . Smokeless tobacco: Never Used  Substance and Sexual Activity  . Alcohol use: Yes    Alcohol/week: 2.0 standard drinks    Types: 2 Glasses of wine per week    Comment: weekly  . Drug use: No  . Sexual activity: Yes    Partners: Female  Other Topics Concern  . Not on file  Social History Narrative   SH: Married, two teenagers (1 boy and 1 girl) as of 10/2015.   He is a Personal assistant, lives in [place], [place].   He  has a PhD in a vertical engineering any [employer] in intellectual property   No Tobacco, rare ETOH, no drug use.   Exercise occasionally - most days the week he goes to the gym.         Social Determinants of Health   Financial Resource Strain:   . Difficulty of Paying Living Expenses: Not on file  Food Insecurity:   . Worried About Charity fundraiser in the Last Year: Not on file  . Ran Out of Food in the Last Year: Not on file  Transportation Needs:   . Lack of Transportation (Medical): Not on file  . Lack of Transportation (Non-Medical): Not on file  Physical Activity:   . Days of Exercise per Week: Not on file  . Minutes of Exercise per Session: Not on file  Stress:   . Feeling of Stress : Not on file  Social Connections:   . Frequency of Communication with Friends and Family: Not on file  . Frequency of Social Gatherings with Friends and Family: Not on file  . Attends Religious Services: Not on file  . Active Member of Clubs or Organizations: Not on file  . Attends Archivist Meetings: Not on file  . Marital Status: Not on file     Family History: The patient's family history includes Cancer in his father and sister; Coronary artery disease (age of onset: 85) in his brother; Diabetes in his sister and sister; Heart attack (age of onset: 77) in his mother; Heart disease (age of onset: 10) in his mother; Hyperlipidemia in his brother and mother;  Hypertension in his brother, mother, sister, and sister; Valvular heart disease (age of onset: 71) in his mother.  ROS:   Please see the history of present illness.     All other systems reviewed and are negative.  EKGs/Labs/Other Studies Reviewed:    The following studies were reviewed today:  Cath 05/05/2017  LESION #1: Prox LAD to Mid LAD lesion, 80 %stenosed.  A STENT SYNERGY DES [id number] drug eluting stent was successfully placed. - Postdilated and tapered fashion to 3.1-2.9 mm  Post intervention, there is a 0% residual stenosis.  LESION #2: Ramus lesion, 85 %stenosed.  A STENT SYNERGY DES 3X12 drug eluting stent was successfully placed. Postdilated to 3.3 mm  Post intervention, there is a 0% residual stenosis.  There is hyperdynamic left ventricular systolic function. The left ventricular ejection fraction is greater than 65% by visual estimate.  LV end diastolic pressure is normal.   Severe 2 vessel disease involving the mid LAD and proximal ramus intermedius treated with 2 DES stents. Otherwise mild to moderate disease elsewhere.  Hyperdynamic left ventricle with normal LVEDP.  Plan:  Overnight monitoring and post procedure unit with TR band removal per protocol. - With 2 vessel PCI, not appropriate for same-day discharge  Dual and platelet therapy for minimum one year with aspirin and Plavix  Increase simvastatin to 40 mm daily  Continue ARB, but no beta blocker due to bradycardia  Likely discharge tomorrow.  Follow-up with me OR APP in roughly 2 weeks  EKG:  EKG is ordered today.  The ekg ordered today demonstrates normal sinus rhythm without significant ST-T wave changes  Recent Labs: 02/07/2019: ALT 32; BUN 19; Creatinine, Ser 1.37; Hemoglobin 11.0; Platelets 215.0; Potassium 4.5; Sodium 137; TSH 1.36  Recent Lipid Panel    Component Value Date/Time   CHOL 104 02/07/2019 1102   TRIG 180.0 (H) 02/07/2019 1102   HDL  28.40 (L) 02/07/2019 1102   CHOLHDL  4 02/07/2019 1102   VLDL 36.0 02/07/2019 1102   LDLCALC 39 02/07/2019 1102   LDLDIRECT 72.0 12/08/2016 0943    Physical Exam:    VS:  BP (!) 130/92   Pulse 64   Ht 5\' 5"  (1.651 m)   Wt 166 lb 12.8 oz (75.7 kg)   BMI 27.76 kg/m     Wt Readings from Last 3 Encounters:  11/10/19 166 lb 12.8 oz (75.7 kg)  09/19/19 168 lb 9.6 oz (76.5 kg)  09/06/19 169 lb (76.7 kg)     GEN:  Well nourished, well developed in no acute distress HEENT: Normal NECK: No JVD; No carotid bruits LYMPHATICS: No lymphadenopathy CARDIAC: RRR, no murmurs, rubs, gallops RESPIRATORY:  Clear to auscultation without rales, wheezing or rhonchi  ABDOMEN: Soft, non-tender, non-distended MUSCULOSKELETAL:  No edema; No deformity  SKIN: Warm and dry NEUROLOGIC:  Alert and oriented x 3 PSYCHIATRIC:  Normal affect   ASSESSMENT:    1. Chest pain, unspecified type   2. CAD S/P percutaneous coronary angioplasty   3. Essential hypertension   4. Hyperlipidemia LDL goal <70   5. Prediabetes    PLAN:    In order of problems listed above:  1. Chest pain: His symptom is somewhat atypical.  However this has not been going away for the past several weeks.  I recommend a Lexiscan Myoview to further assess  2. CAD: Last PCI was in 2018.  Continue Plavix and Crestor  3. Hyperlipidemia: On Crestor 20 mg daily  4. Prediabetes: Diet and exercise   Medication Adjustments/Labs and Tests Ordered: Current medicines are reviewed at length with the patient today.  Concerns regarding medicines are outlined above.  Orders Placed This Encounter  Procedures  . MYOCARDIAL PERFUSION IMAGING  . EKG 12-Lead   No orders of the defined types were placed in this encounter.   Patient Instructions  Medication Instructions:  Your physician recommends that you continue on your current medications as directed. Please refer to the Current Medication list given to you today.  *If you need a refill on your cardiac medications before  your next appointment, please call your pharmacy*  Testing/Procedures: Isaac Laud PA has ordered a Lexiscan Myocardial Perfusion Imaging Study.  Please arrive 15 minutes prior to your appointment time for registration and insurance purposes.   The test will take approximately 3 to 4 hours to complete; you may bring reading material.  If someone comes with you to your appointment, they will need to remain in the main lobby due to limited space in the testing area.   How to prepare for your Myocardial Perfusion Test:  Do not eat or drink 3 hours prior to your test, except you may have water.  Do not consume products containing caffeine (regular or decaffeinated) 12 hours prior to your test. (ex: coffee, chocolate, sodas, tea).  Do wear comfortable clothes (no dresses or overalls) and walking shoes, tennis shoes preferred (No heels or open toe shoes are allowed).  Do NOT wear cologne, perfume, aftershave, or lotions (deodorant is allowed).  If you use an inhaler, use it the AM of your test and bring it with you.   If you use a nebulizer, use it the AM of your test.   If these instructions are not followed, your test will have to be rescheduled.    Follow-Up: At [facility], you and your health needs are our priority.  As part of our continuing mission to  provide you with exceptional heart care, we have created designated Provider Care Teams.  These Care Teams include your primary Cardiologist (physician) and Advanced Practice Providers (APPs -  Physician Assistants and Nurse Practitioners) who all work together to provide you with the care you need, when you need it.  We recommend signing up for the patient portal called "MyChart".  Sign up information is provided on this After Visit Summary.  MyChart is used to connect with patients for Virtual Visits (Telemedicine).  Patients are able to view lab/test results, encounter notes, upcoming appointments, etc.  Non-urgent messages can be sent to  your provider as well.   To learn more about what you can do with MyChart, go to [email or web address].    Your next appointment:   6 month(s)  The format for your next appointment:   In Person  Provider:   You may see Glenetta Hew, MD or one of the following Advanced Practice Providers on your designated Care Team:    Rosaria Ferries, PA-C  Jory Sims, DNP, ANP  Cadence Kathlen Mody, NP    Other Instructions      Signed, Almyra Deforest, [place]  11/12/2019 11:56 PM    [place]

## 2019-11-12 ENCOUNTER — Encounter: Payer: Self-pay | Admitting: Physician Assistant

## 2019-11-12 NOTE — Addendum Note (Signed)
Addended byEulas Post, [place] on: 11/12/2019 11:58 PM   Modules accepted: Level of Service

## 2019-11-14 ENCOUNTER — Telehealth (HOSPITAL_COMMUNITY): Payer: Self-pay

## 2019-11-14 NOTE — Telephone Encounter (Signed)
Encounter complete. 

## 2019-11-15 ENCOUNTER — Telehealth (HOSPITAL_COMMUNITY): Payer: Self-pay

## 2019-11-15 NOTE — Telephone Encounter (Signed)
Encounter complete. 

## 2019-11-16 ENCOUNTER — Ambulatory Visit (HOSPITAL_COMMUNITY)
Admission: RE | Admit: 2019-11-16 | Discharge: 2019-11-16 | Disposition: A | Discharge status: Home / Self Care | Payer: 59 | Source: Ambulatory Visit | Attending: Cardiology | Admitting: Cardiology

## 2019-11-16 ENCOUNTER — Encounter (INDEPENDENT_AMBULATORY_CARE_PROVIDER_SITE_OTHER): Payer: Self-pay

## 2019-11-16 ENCOUNTER — Other Ambulatory Visit: Payer: Self-pay

## 2019-11-16 DIAGNOSIS — I2510 Atherosclerotic heart disease of native coronary artery without angina pectoris: Secondary | ICD-10-CM | POA: Diagnosis present

## 2019-11-16 DIAGNOSIS — Z9861 Coronary angioplasty status: Secondary | ICD-10-CM

## 2019-11-16 DIAGNOSIS — R079 Chest pain, unspecified: Secondary | ICD-10-CM

## 2019-11-16 HISTORY — PX: NM MYOVIEW LTD: HXRAD82

## 2019-11-16 HISTORY — PX: CARDIOVASCULAR STRESS TEST: SHX262

## 2019-11-16 LAB — MYOCARDIAL PERFUSION IMAGING
LV dias vol: 89 mL (ref 62–150)
LV sys vol: 43 mL
Peak HR: 120 {beats}/min
Rest HR: 56 {beats}/min
SDS: 1
SRS: 5
SSS: 6
TID: 1.21

## 2019-11-16 MED ORDER — REGADENOSON 0.4 MG/5ML IV SOLN
0.4000 mg | Freq: Once | INTRAVENOUS | Status: AC
  Administered 2019-11-16: 0.4 mg via INTRAVENOUS

## 2019-11-16 MED ORDER — TECHNETIUM TC 99M TETROFOSMIN IV KIT
9.8000 | PACK | Freq: Once | INTRAVENOUS | Status: AC | PRN
  Administered 2019-11-16: 9.8 via INTRAVENOUS
  Filled 2019-11-16: qty 10

## 2019-11-16 MED ORDER — TECHNETIUM TC 99M TETROFOSMIN IV KIT
30.6000 | PACK | Freq: Once | INTRAVENOUS | Status: AC | PRN
  Administered 2019-11-16: 30.6 via INTRAVENOUS
  Filled 2019-11-16: qty 31

## 2019-11-23 ENCOUNTER — Other Ambulatory Visit: Payer: Self-pay | Admitting: Family Medicine

## 2019-12-06 ENCOUNTER — Ambulatory Visit: Payer: 59 | Admitting: Adult Health

## 2019-12-07 ENCOUNTER — Encounter: Payer: Self-pay | Admitting: Family Medicine

## 2019-12-20 ENCOUNTER — Ambulatory Visit: Payer: 59 | Admitting: Cardiology

## 2020-02-24 ENCOUNTER — Other Ambulatory Visit: Payer: Self-pay | Admitting: Family Medicine

## 2020-02-29 ENCOUNTER — Telehealth: Payer: Self-pay

## 2020-02-29 DIAGNOSIS — M5430 Sciatica, unspecified side: Secondary | ICD-10-CM

## 2020-02-29 NOTE — Telephone Encounter (Signed)
Patient's last visit was 09/19/19 for routine f/u and next f/u appt 03/20/20. Last PT referral done 03/25/15 to [facility]. Okay for referral?

## 2020-02-29 NOTE — Telephone Encounter (Signed)
Patient calling wanting to know if he can have a referral to previous PT used before    Please call and advise

## 2020-02-29 NOTE — Telephone Encounter (Signed)
Verified with patient that diagnosis would be the same.

## 2020-02-29 NOTE — Telephone Encounter (Signed)
Sure, but I need to confirm the diagnosis he wants PT for->is it low back pain with sciatica like it was in the past?

## 2020-03-01 ENCOUNTER — Other Ambulatory Visit: Payer: Self-pay

## 2020-03-01 ENCOUNTER — Telehealth: Payer: Self-pay

## 2020-03-01 MED ORDER — PANTOPRAZOLE SODIUM 40 MG PO TBEC: 40.0000 mg | DELAYED_RELEASE_TABLET | Freq: Two times a day (BID) | ORAL | 0 refills | Status: DC

## 2020-03-01 NOTE — Telephone Encounter (Signed)
Patient said he called on Wednesday 6/15 and made an appt for his CPE and requested a refill for [place] he also stated that he requested physical therapy  No notes requesting refill. Patient is very upset.  Because of time of call (4:45) Friday 03/01/20 weekend, I asked Mervil Wacker, LPN to review chart to see if we could assist patient.  She said could fill meds until 7/7  Patient was behind in making his [facility] appts with Dr. Anitra Lauth. Patient was scheduled to see Dr. Anitra Lauth for his CPE on 03/20/20.   She sent meds to [facility]

## 2020-03-01 NOTE — Telephone Encounter (Signed)
New referral entered, will be contacted for scheduling details.

## 2020-03-02 DIAGNOSIS — S0101XA Laceration without foreign body of scalp, initial encounter: Secondary | ICD-10-CM | POA: Diagnosis not present

## 2020-03-04 ENCOUNTER — Telehealth: Payer: Self-pay

## 2020-03-04 NOTE — Telephone Encounter (Signed)
Spoke with patient regarding head injury.  Patient reports hitting head on metal portion of garage door.  Patient was treated at local Urgent Care with staples, antibiotics and tetanus vaccine.  Patient plans to return to UC for staple removal, advised to take all antibiotics prescribed and call with concern.  Verbalized understanding.

## 2020-03-04 NOTE — Telephone Encounter (Signed)
Patient came into the office seeking care as a walk in, I advised patient that we do not do walk ins. He also states that he wanted Dr to put in a consult for wound care. Patient states he was in urgent care over the weekend with a head injury that he has 7 to 8 stitches. He is concerned about the swelling and possible infection. Asking for a work a call consult    Please call and advise

## 2020-03-07 DIAGNOSIS — S0101XD Laceration without foreign body of scalp, subsequent encounter: Secondary | ICD-10-CM | POA: Diagnosis not present

## 2020-03-11 DIAGNOSIS — M5430 Sciatica, unspecified side: Secondary | ICD-10-CM | POA: Diagnosis not present

## 2020-03-11 DIAGNOSIS — M542 Cervicalgia: Secondary | ICD-10-CM | POA: Diagnosis not present

## 2020-03-20 ENCOUNTER — Other Ambulatory Visit: Payer: Self-pay | Admitting: Family Medicine

## 2020-03-20 ENCOUNTER — Encounter: Payer: BLUE CROSS/BLUE SHIELD | Admitting: Family Medicine

## 2020-03-20 ENCOUNTER — Other Ambulatory Visit: Payer: Self-pay

## 2020-03-20 DIAGNOSIS — M542 Cervicalgia: Secondary | ICD-10-CM | POA: Diagnosis not present

## 2020-03-20 DIAGNOSIS — M5430 Sciatica, unspecified side: Secondary | ICD-10-CM | POA: Diagnosis not present

## 2020-03-20 NOTE — Progress Notes (Deleted)
Office Note 03/20/2020  CC: No chief complaint on file.   HPI:  Thomas Spencer is a 54 y.o. Black male who is here for annual health maintenance exam.   Past Medical History:  Diagnosis Date  . Acute epiglottitis 01/2014  . BPH with obstruction/lower urinary tract symptoms    [facility] referral done 01/[facility] (Dr. Risa Grill); ultimately a prostate biopsy was done for increased PSA velocity--April 2013--and it  was completely normal.  . CAD S/P percutaneous coronary angioplasty 05/05/2017   Progressive Angina: (3 V CAD on Cor Calcium Score) --> LEFT HEART CATH & CORONARY ANGIOGRAPHY  / CORONARY STENT INTERVENTION Conclusion  LESION #1: p-mLAD to Mid LAD 80 %s --> PCI SYNERGY DES 2.75X16 - Tapered post-dilation: 3.1-2.9 mm.  LESION #2: pRI 85 % - PCI SYNERGY DES 3X12.  Initial plan ASA/Plavix x 1 yr  --> ASA stopped 06/2017 for GI Bleed-- on Plavix alone and continued w/this 11/2017  . Elevated coronary artery calcium score 03/2017   indicating possible 3 vessel CAD: cardiac cath per Dr. Ellyn Hack 8/22 revealed 2 Vessel obstructive CAD in LAD & RI   . Elevated liver function tests    mild  . Epididymal cyst 09/2019   Right (vs spermatocele.  Hematocele less likely since no hx of trauma).  . Erectile dysfunction   . Family history of colon cancer    father  . Family history of hypertension    strong family history  . Family history of premature coronary heart disease    strong family history; Mother (MI in 14s, then 13s, s/p Valve Sgx in 37s) & oldest Brother (CABG x 4 in 57s)  . History of adenomatous polyp of colon 2007; 2014; 08/2019   Dr. Henrene Pastor did 2020 scope.  Recall 2025.  Marland Kitchen History of sciatica 11/2013   responded well to prednisone burst 11/2013  . Hyperlipidemia    On 40 mg Crestor as of 06/2017  . Hypertension    On telmisartan  . Iron deficiency anemia 06/2017   Hb drop from 15 down to 11 from 04/2017-06/2017.  +H pylori gastritis+.  Recurrence 01/2019 +correlation with  melena and fatigue.  Pt has been back on iron x 2 wks and is making arrangements to see his GI MD as of 02/08/19 (? repeat EGD + is overdue for repeat colonoscopy).  . Prediabetes 11/2016   HbA1c 6.1%     Past Surgical History:  Procedure Laterality Date  . CARDIOVASCULAR STRESS TEST  11/16/2019   Normal EF, no ischemia  . COLONOSCOPY W/ POLYPECTOMY  09/02/06; 07/2013; 08/2019   2014 [facility]: adenomatous polyp w/out high grade dysplasia or malignancy.  08/2019 adenoma x 2, diverticulosis, recall 5 yrs.  . coronary calcium score     Very high risk: as of 03/2017 pt is to return to discuss with Dr. Ellyn Hack: stress test vs cath as next step.  . CORONARY STENT INTERVENTION N/A 05/05/2017   DES x 2. Preserved LV function.  Procedure: CORONARY STENT INTERVENTION;  Surgeon: Leonie Man, MD;  Location: [facility];  Service: Cardiovascular;: CAD S/P PCI: p-mLAD Synergy DES 2.75 x 16 (3.1-2.9 mm), p-mRI Synergy DES 3.0 x 12 (3.3 mm)  . dental implants    . ESOPHAGOGASTRODUODENOSCOPY  07/08/2017   GI RECOMMENDED D/C ASPIRIN:  superfic non-bleeding ulcers ranging 5-51mm in size in antrum.  +H pylori +.  A few non-bleeding erosions in pre-pyloric region.  Normal duodenum.  Marland Kitchen LEFT HEART CATH AND CORONARY ANGIOGRAPHY N/A 05/05/2017  Procedure: LEFT HEART CATH AND CORONARY ANGIOGRAPHY;  Surgeon: Leonie Man, MD;  Location: [facility];  Service: Cardiovascular;  Laterality: N/A; p-m LAD 80%, mRI 90% --> 2 vessel PCI  . PROSTATE BIOPSY  01/2012   Normal    Family History  Problem Relation Age of Onset  . Heart attack Mother 102  . Heart disease Mother 25  . Hyperlipidemia Mother   . Hypertension Mother   . Valvular heart disease Mother 54       s/p MVR-CABG  . Cancer Father        colon  . Cancer Sister        cervical/ remission  . Hyperlipidemia Brother   . Hypertension Brother   . Coronary artery disease Brother 74       CABG x 4  . Diabetes Sister        type 2  .  Hypertension Sister   . Hypertension Sister   . Diabetes Sister        type 2    Social History   Socioeconomic History  . Marital status: Married    Spouse name: Not on file  . Number of children: 2  . Years of education: Not on file  . Highest education level: Not on file  Occupational History  . Occupation: Personal assistant    Comment: [employer].  Tobacco Use  . Smoking status: Never Smoker  . Smokeless tobacco: Never Used  Vaping Use  . Vaping Use: Never used  Substance and Sexual Activity  . Alcohol use: Yes    Alcohol/week: 2.0 standard drinks    Types: 2 Glasses of wine per week    Comment: weekly  . Drug use: No  . Sexual activity: Yes    Partners: Female  Other Topics Concern  . Not on file  Social History Narrative   SH: Married, two teenagers (1 boy and 1 girl) as of 10/2015.   He is a Personal assistant, lives in [place], [place].   He has a PhD in a vertical engineering any [employer] in intellectual property   No Tobacco, rare ETOH, no drug use.   Exercise occasionally - most days the week he goes to the gym.         Social Determinants of Health   Financial Resource Strain:   . Difficulty of Paying Living Expenses:   Food Insecurity:   . Worried About Charity fundraiser in the Last Year:   . Arboriculturist in the Last Year:   Transportation Needs:   . Film/video editor (Medical):   Marland Kitchen Lack of Transportation (Non-Medical):   Physical Activity:   . Days of Exercise per Week:   . Minutes of Exercise per Session:   Stress:   . Feeling of Stress :   Social Connections:   . Frequency of Communication with Friends and Family:   . Frequency of Social Gatherings with Friends and Family:   . Attends Religious Services:   . Active Member of Clubs or Organizations:   . Attends Archivist Meetings:   Marland Kitchen Marital Status:   Intimate Partner Violence:   . Fear of Current or Ex-Partner:   . Emotionally Abused:   Marland Kitchen Physically Abused:   . Sexually Abused:      Outpatient Medications Prior to Visit  Medication Sig Dispense Refill  . clopidogrel (PLAVIX) 75 MG tablet Take 1 tablet (75 mg total) by mouth daily with breakfast.  30 tablet 0  . Multiple Vitamin (MULTIVITAMIN WITH MINERALS) TABS tablet Take 1 tablet by mouth daily.    . pantoprazole (PROTONIX) 40 MG tablet Take 1 tablet (40 mg total) by mouth 2 (two) times daily. Needs appt for further refills 60 tablet 0  . rosuvastatin (CRESTOR) 20 MG tablet Take 1 tablet (20 mg total) by mouth daily. 90 tablet 0  . sildenafil (VIAGRA) 100 MG tablet Take 1 tablet (100 mg total) by mouth daily as needed for erectile dysfunction. 6 tablet 2  . telmisartan (MICARDIS) 40 MG tablet Take 1 tablet (40 mg total) by mouth daily. 30 tablet 0   No facility-administered medications prior to visit.    Allergies  Allergen Reactions  . Ace Inhibitors Cough  . Shrimp [Shellfish Allergy] Other (See Comments)    Swelling around eyes/lips---can eat IF cooked WELL    ROS *** PE; There were no vitals taken for this visit. *** Pertinent labs:  Lab Results  Component Value Date   TSH 1.36 02/07/2019   Lab Results  Component Value Date   WBC 3.0 (L) 02/07/2019   HGB 11.0 (L) 02/07/2019   HCT 31.6 (L) 02/07/2019   MCV 95.9 02/07/2019   PLT 215.0 02/07/2019   Lab Results  Component Value Date   IRON 29 (L) 02/07/2019   TIBC 332 02/07/2019   FERRITIN 17 (L) 02/07/2019    Lab Results  Component Value Date   CREATININE 1.37 02/07/2019   BUN 19 02/07/2019   NA 137 02/07/2019   K 4.5 02/07/2019   CL 103 02/07/2019   CO2 27 02/07/2019   Lab Results  Component Value Date   ALT 32 02/07/2019   AST 23 02/07/2019   ALKPHOS 52 02/07/2019   BILITOT 0.3 02/07/2019   Lab Results  Component Value Date   CHOL 104 02/07/2019   Lab Results  Component Value Date   HDL 28.40 (L) 02/07/2019   Lab Results  Component Value Date   LDLCALC 39 02/07/2019   Lab Results  Component Value Date   TRIG  180.0 (H) 02/07/2019   Lab Results  Component Value Date   CHOLHDL 4 02/07/2019   Lab Results  Component Value Date   PSA 0.64 02/07/2019   PSA 0.68 12/08/2016   PSA 0.61 10/23/2015   Lab Results  Component Value Date   HGBA1C 5.2 02/07/2019   ASSESSMENT AND PLAN:   Health maintenance exam: Reviewed age and gender appropriate health maintenance issues (prudent diet, regular exercise, health risks of tobacco and excessive alcohol, use of seatbelts, fire alarms in home, use of sunscreen).  Also reviewed age and gender appropriate health screening as well as vaccine recommendations. Vaccines: Tdap due->***.  Pneumovax (CAD)->***.  Shingrix-->***.  Covid 19 UTD (11/25/19). Labs: fasting HP (IFG, HLD, HTN, hx of IDA)+ PSA. Prostate ca screening: DRE , PSA. Colon ca screening: recall 2025.  An After Visit Summary was printed and given to the patient.  FOLLOW UP:  No follow-ups on file.  Signed:  Crissie Sickles, MD           03/20/2020

## 2020-03-22 DIAGNOSIS — M542 Cervicalgia: Secondary | ICD-10-CM | POA: Diagnosis not present

## 2020-03-22 DIAGNOSIS — M5430 Sciatica, unspecified side: Secondary | ICD-10-CM | POA: Diagnosis not present

## 2020-03-24 ENCOUNTER — Other Ambulatory Visit: Payer: Self-pay | Admitting: Family Medicine

## 2020-03-25 DIAGNOSIS — M5430 Sciatica, unspecified side: Secondary | ICD-10-CM | POA: Diagnosis not present

## 2020-03-25 DIAGNOSIS — M542 Cervicalgia: Secondary | ICD-10-CM | POA: Diagnosis not present

## 2020-03-27 DIAGNOSIS — M5430 Sciatica, unspecified side: Secondary | ICD-10-CM | POA: Diagnosis not present

## 2020-03-27 DIAGNOSIS — M542 Cervicalgia: Secondary | ICD-10-CM | POA: Diagnosis not present

## 2020-04-03 ENCOUNTER — Other Ambulatory Visit: Payer: Self-pay

## 2020-04-03 ENCOUNTER — Ambulatory Visit (INDEPENDENT_AMBULATORY_CARE_PROVIDER_SITE_OTHER): Payer: BC Managed Care – PPO | Admitting: Family Medicine

## 2020-04-03 ENCOUNTER — Encounter: Payer: Self-pay | Admitting: Family Medicine

## 2020-04-03 VITALS — BP 128/80 | HR 59 | Temp 97.8°F | Resp 16 | Ht 65.0 in | Wt 158.6 lb

## 2020-04-03 DIAGNOSIS — I2510 Atherosclerotic heart disease of native coronary artery without angina pectoris: Secondary | ICD-10-CM | POA: Diagnosis not present

## 2020-04-03 DIAGNOSIS — Z125 Encounter for screening for malignant neoplasm of prostate: Secondary | ICD-10-CM

## 2020-04-03 DIAGNOSIS — Z0000 Encounter for general adult medical examination without abnormal findings: Secondary | ICD-10-CM

## 2020-04-03 DIAGNOSIS — I10 Essential (primary) hypertension: Secondary | ICD-10-CM | POA: Diagnosis not present

## 2020-04-03 DIAGNOSIS — Z862 Personal history of diseases of the blood and blood-forming organs and certain disorders involving the immune mechanism: Secondary | ICD-10-CM | POA: Diagnosis not present

## 2020-04-03 DIAGNOSIS — Z23 Encounter for immunization: Secondary | ICD-10-CM | POA: Diagnosis not present

## 2020-04-03 DIAGNOSIS — R7301 Impaired fasting glucose: Secondary | ICD-10-CM | POA: Diagnosis not present

## 2020-04-03 LAB — CBC WITH DIFFERENTIAL/PLATELET
Basophils Absolute: 0 10*3/uL (ref 0.0–0.1)
Basophils Relative: 1 % (ref 0.0–3.0)
Eosinophils Absolute: 0.1 10*3/uL (ref 0.0–0.7)
Eosinophils Relative: 2.7 % (ref 0.0–5.0)
HCT: 47.1 % (ref 39.0–52.0)
Hemoglobin: 16.1 g/dL (ref 13.0–17.0)
Lymphocytes Relative: 35.2 % (ref 12.0–46.0)
Lymphs Abs: 1.4 10*3/uL (ref 0.7–4.0)
MCHC: 34.2 g/dL (ref 30.0–36.0)
MCV: 94.9 fl (ref 78.0–100.0)
Monocytes Absolute: 0.7 10*3/uL (ref 0.1–1.0)
Monocytes Relative: 16.5 % — ABNORMAL HIGH (ref 3.0–12.0)
Neutro Abs: 1.8 10*3/uL (ref 1.4–7.7)
Neutrophils Relative %: 44.6 % (ref 43.0–77.0)
Platelets: 146 10*3/uL — ABNORMAL LOW (ref 150.0–400.0)
RBC: 4.96 Mil/uL (ref 4.22–5.81)
RDW: 13.2 % (ref 11.5–15.5)
WBC: 4.1 10*3/uL (ref 4.0–10.5)

## 2020-04-03 LAB — COMPREHENSIVE METABOLIC PANEL
ALT: 55 U/L — ABNORMAL HIGH (ref 0–53)
AST: 36 U/L (ref 0–37)
Albumin: 4.5 g/dL (ref 3.5–5.2)
Alkaline Phosphatase: 50 U/L (ref 39–117)
BUN: 18 mg/dL (ref 6–23)
CO2: 30 mEq/L (ref 19–32)
Calcium: 9.9 mg/dL (ref 8.4–10.5)
Chloride: 101 mEq/L (ref 96–112)
Creatinine, Ser: 1.44 mg/dL (ref 0.40–1.50)
GFR: 61.35 mL/min (ref >60.00)
Glucose, Bld: 100 mg/dL — ABNORMAL HIGH (ref 70–99)
Potassium: 4.7 mEq/L (ref 3.5–5.1)
Sodium: 137 mEq/L (ref 135–145)
Total Bilirubin: 0.7 mg/dL (ref 0.2–1.2)
Total Protein: 7.1 g/dL (ref 6.0–8.3)

## 2020-04-03 LAB — LIPID PANEL
Cholesterol: 153 mg/dL (ref 0–200)
HDL: 35.9 mg/dL — ABNORMAL LOW (ref >39.00)
LDL Cholesterol: 90 mg/dL (ref 0–99)
NonHDL: 117.3
Total CHOL/HDL Ratio: 4
Triglycerides: 135 mg/dL (ref 0.0–149.0)
VLDL: 27 mg/dL (ref 0.0–40.0)

## 2020-04-03 LAB — TSH: TSH: 1.2 u[IU]/mL (ref 0.35–4.50)

## 2020-04-03 LAB — PSA: PSA: 0.56 ng/mL (ref 0.10–4.00)

## 2020-04-03 NOTE — Patient Instructions (Signed)

## 2020-04-03 NOTE — Progress Notes (Signed)
Office Note 04/03/2020  CC:  Chief Complaint  Patient presents with  . Annual Exam    pt is fasting   HPI:  Thomas Spencer is a 54 y.o. male who is here for annual health maintenance exam. Feeling well. Exercising/running regularly and he has no limitations by SOB or CP or other sx's. Had some L sided CP early this year, got stress test after seeing his cardiologist and this was NEG. Denies any melena or hematochezia or other bleeding problems at all lately.   Past Medical History:  Diagnosis Date  . Acute epiglottitis 01/2014  . BPH with obstruction/lower urinary tract symptoms    [facility] (Dr. Risa Grill); ultimately a prostate biopsy was done for increased PSA velocity--April 2013--and it  was completely normal.  . CAD S/P percutaneous coronary angioplasty 05/05/2017   Progressive Angina: (3 V CAD on Cor Calcium Score) --> LEFT HEART CATH & CORONARY ANGIOGRAPHY  / CORONARY STENT INTERVENTION Conclusion  LESION #1: p-mLAD to Mid LAD 80 %s --> PCI SYNERGY DES 2.75X16 - Tapered post-dilation: 3.1-2.9 mm.  LESION #2: pRI 85 % - PCI SYNERGY DES 3X12.  Initial plan ASA/Plavix x 1 yr  --> ASA stopped 06/2017 for GI Bleed-- on Plavix alone and continued w/this 11/2017  . Elevated coronary artery calcium score 03/2017   indicating possible 3 vessel CAD: cardiac cath per Dr. Ellyn Hack 8/22 revealed 2 Vessel obstructive CAD in LAD & RI   . Elevated liver function tests    mild  . Epididymal cyst 09/2019   Right (vs spermatocele.  Hematocele less likely since no hx of trauma).  . Erectile dysfunction   . Family history of colon cancer    father  . Family history of hypertension    strong family history  . Family history of premature coronary heart disease    strong family history; Mother (MI in 34s, then 71s, s/p Valve Sgx in 6s) & oldest Brother (CABG x 4 in 36s)  . History of adenomatous polyp of colon 2007; 2014; 08/2019   Dr. Henrene Pastor did 2020 scope.  Recall  2025.  Marland Kitchen History of sciatica 11/2013   responded well to prednisone burst 11/2013  . Hyperlipidemia    On 40 mg Crestor as of 06/2017  . Hypertension    On telmisartan  . Iron deficiency anemia 06/2017   Hb drop from 15 down to 11 from 04/2017-06/2017.  +H pylori gastritis+.  Recurrence 01/2019 +correlation with melena and fatigue.  Pt has been back on iron x 2 wks and is making arrangements to see his GI MD as of 02/08/19 (? repeat EGD + is overdue for repeat colonoscopy).  . Prediabetes 11/2016   HbA1c 6.1%     Past Surgical History:  Procedure Laterality Date  . CARDIOVASCULAR STRESS TEST  11/16/2019   Normal EF, no ischemia  . COLONOSCOPY W/ POLYPECTOMY  09/02/06; 07/2013; 08/2019   2014 [facility]: adenomatous polyp w/out high grade dysplasia or malignancy.  08/2019 adenoma x 2, diverticulosis, recall 5 yrs.  . coronary calcium score     Very high risk: as of 03/2017 pt is to return to discuss with Dr. Ellyn Hack: stress test vs cath as next step.  . CORONARY STENT INTERVENTION N/A 05/05/2017   DES x 2. Preserved LV function.  Procedure: CORONARY STENT INTERVENTION;  Surgeon: Leonie Man, MD;  Location: [facility];  Service: Cardiovascular;: CAD S/P PCI: p-mLAD Synergy DES 2.75 x 16 (3.1-2.9 mm), p-mRI Synergy  DES 3.0 x 12 (3.3 mm)  . dental implants    . ESOPHAGOGASTRODUODENOSCOPY  07/08/2017   GI RECOMMENDED D/C ASPIRIN:  superfic non-bleeding ulcers ranging 5-70mm in size in antrum.  +H pylori +.  A few non-bleeding erosions in pre-pyloric region.  Normal duodenum.  Marland Kitchen LEFT HEART CATH AND CORONARY ANGIOGRAPHY N/A 05/05/2017   Procedure: LEFT HEART CATH AND CORONARY ANGIOGRAPHY;  Surgeon: Leonie Man, MD;  Location: [facility];  Service: Cardiovascular;  Laterality: N/A; p-m LAD 80%, mRI 90% --> 2 vessel PCI  . PROSTATE BIOPSY  01/2012   Normal    Family History  Problem Relation Age of Onset  . Heart attack Mother 43  . Heart disease Mother 45  . Hyperlipidemia  Mother   . Hypertension Mother   . Valvular heart disease Mother 88       s/p MVR-CABG  . Cancer Father        colon  . Cancer Sister        cervical/ remission  . Hyperlipidemia Brother   . Hypertension Brother   . Coronary artery disease Brother 60       CABG x 4  . Diabetes Sister        type 2  . Hypertension Sister   . Hypertension Sister   . Diabetes Sister        type 2    Social History   Socioeconomic History  . Marital status: Married    Spouse name: Not on file  . Number of children: 2  . Years of education: Not on file  . Highest education level: Not on file  Occupational History  . Occupation: Personal assistant    Comment: [employer].  Tobacco Use  . Smoking status: Never Smoker  . Smokeless tobacco: Never Used  Vaping Use  . Vaping Use: Never used  Substance and Sexual Activity  . Alcohol use: Yes    Alcohol/week: 2.0 standard drinks    Types: 2 Glasses of wine per week    Comment: weekly  . Drug use: No  . Sexual activity: Yes    Partners: Female  Other Topics Concern  . Not on file  Social History Narrative   SH: Married, two teenagers (1 boy and 1 girl) as of 10/2015.   He is a Personal assistant, lives in [place], [place].   He has a PhD in a vertical engineering any [employer] in intellectual property   No Tobacco, rare ETOH, no drug use.   Exercise occasionally - most days the week he goes to the gym.         Social Determinants of Health   Financial Resource Strain:   . Difficulty of Paying Living Expenses:   Food Insecurity:   . Worried About Charity fundraiser in the Last Year:   . Arboriculturist in the Last Year:   Transportation Needs:   . Film/video editor (Medical):   Marland Kitchen Lack of Transportation (Non-Medical):   Physical Activity:   . Days of Exercise per Week:   . Minutes of Exercise per Session:   Stress:   . Feeling of Stress :   Social Connections:   . Frequency of Communication with Friends and Family:   . Frequency of Social  Gatherings with Friends and Family:   . Attends Religious Services:   . Active Member of Clubs or Organizations:   . Attends Archivist Meetings:   Marland Kitchen Marital Status:  Intimate Partner Violence:   . Fear of Current or Ex-Partner:   . Emotionally Abused:   Marland Kitchen Physically Abused:   . Sexually Abused:     Outpatient Medications Prior to Visit  Medication Sig Dispense Refill  . clopidogrel (PLAVIX) 75 MG tablet TAKE 1 TABLET (75 MG TOTAL) BY MOUTH DAILY WITH BREAKFAST. 30 tablet 0  . Multiple Vitamin (MULTIVITAMIN WITH MINERALS) TABS tablet Take 1 tablet by mouth daily.    . pantoprazole (PROTONIX) 40 MG tablet Take 1 tablet (40 mg total) by mouth 2 (two) times daily. Needs appt for further refills 60 tablet 0  . rosuvastatin (CRESTOR) 20 MG tablet TAKE 1 TABLET BY MOUTH EVERY DAY 30 tablet 0  . sildenafil (VIAGRA) 100 MG tablet Take 1 tablet (100 mg total) by mouth daily as needed for erectile dysfunction. 6 tablet 2  . telmisartan (MICARDIS) 40 MG tablet TAKE 1 TABLET BY MOUTH EVERY DAY 30 tablet 0   No facility-administered medications prior to visit.    Allergies  Allergen Reactions  . Ace Inhibitors Cough  . Shrimp [Shellfish Allergy] Other (See Comments)    Swelling around eyes/lips---can eat IF cooked WELL    ROS Review of Systems  Constitutional: Negative for appetite change, chills, fatigue and fever.  HENT: Negative for congestion, dental problem, ear pain and sore throat.   Eyes: Negative for discharge, redness and visual disturbance.  Respiratory: Negative for cough, chest tightness, shortness of breath and wheezing.   Cardiovascular: Negative for chest pain, palpitations and leg swelling.  Gastrointestinal: Negative for abdominal pain, blood in stool, diarrhea, nausea and vomiting.  Genitourinary: Negative for difficulty urinating, dysuria, flank pain, frequency, hematuria and urgency.  Musculoskeletal: Negative for arthralgias, back pain, joint swelling,  myalgias and neck stiffness.  Skin: Negative for pallor and rash.  Neurological: Negative for dizziness, speech difficulty, weakness and headaches.  Hematological: Negative for adenopathy. Does not bruise/bleed easily.  Psychiatric/Behavioral: Negative for confusion and sleep disturbance. The patient is not nervous/anxious.     PE; Vitals with BMI 04/03/2020 11/16/2019 11/10/2019  Height 5\' 5"  5\' 5"  5\' 5"   Weight 158 lbs 10 oz 166 lbs 166 lbs 13 oz  BMI 26.39 18.29 93.71  Systolic 696 - 789  Diastolic 80 - 92  Pulse 59 - 64  O2 sat on RA today is 99%  Gen: Alert, well appearing.  Patient is oriented to person, place, time, and situation. AFFECT: pleasant, lucid thought and speech. ENT: Ears: EACs clear, normal epithelium.  TMs with good light reflex and landmarks bilaterally.  Eyes: no injection, icteris, swelling, or exudate.  EOMI, PERRLA. Nose: no drainage or turbinate edema/swelling.  No injection or focal lesion.  Mouth: lips without lesion/swelling.  Oral mucosa pink and moist.  Dentition intact and without obvious caries or gingival swelling.  Oropharynx without erythema, exudate, or swelling.  Neck: supple/nontender.  No LAD, mass, or TM.  Carotid pulses 2+ bilaterally, without bruits. CV: RRR, no m/r/g.   LUNGS: CTA bilat, nonlabored resps, good aeration in all lung fields. ABD: soft, NT, ND, BS normal.  No hepatospenomegaly or mass.  No bruits. EXT: no clubbing, cyanosis, or edema.  Musculoskeletal: no joint swelling, erythema, warmth, or tenderness.  ROM of all joints intact. Skin - no sores or suspicious lesions or rashes or color changes Rectal exam: negative without mass, lesions or tenderness, PROSTATE EXAM: smooth and symmetric without nodules or tenderness.   Pertinent labs:  Lab Results  Component Value Date   TSH 1.36  02/07/2019   Lab Results  Component Value Date   WBC 3.0 (L) 02/07/2019   HGB 11.0 (L) 02/07/2019   HCT 31.6 (L) 02/07/2019   MCV 95.9 02/07/2019    PLT 215.0 02/07/2019   Lab Results  Component Value Date   IRON 29 (L) 02/07/2019   TIBC 332 02/07/2019   FERRITIN 17 (L) 02/07/2019    Lab Results  Component Value Date   CREATININE 1.37 02/07/2019   BUN 19 02/07/2019   NA 137 02/07/2019   K 4.5 02/07/2019   CL 103 02/07/2019   CO2 27 02/07/2019   Lab Results  Component Value Date   ALT 32 02/07/2019   AST 23 02/07/2019   ALKPHOS 52 02/07/2019   BILITOT 0.3 02/07/2019   Lab Results  Component Value Date   CHOL 104 02/07/2019   Lab Results  Component Value Date   HDL 28.40 (L) 02/07/2019   Lab Results  Component Value Date   LDLCALC 39 02/07/2019   Lab Results  Component Value Date   TRIG 180.0 (H) 02/07/2019   Lab Results  Component Value Date   CHOLHDL 4 02/07/2019   Lab Results  Component Value Date   PSA 0.64 02/07/2019   PSA 0.68 12/08/2016   PSA 0.61 10/23/2015   Lab Results  Component Value Date   HGBA1C 5.2 02/07/2019   ASSESSMENT AND PLAN:   Health maintenance exam: Reviewed age and gender appropriate health maintenance issues (prudent diet, regular exercise, health risks of tobacco and excessive alcohol, use of seatbelts, fire alarms in home, use of sunscreen).  Also reviewed age and gender appropriate health screening as well as vaccine recommendations. Vaccines: Tdap and covid UTD.  Shingrix->#1 today. Labs: fasting HP, A1c, iron panel (HTN, HLD, CAD, IFG, hx of IDA from upper GIB). Prostate ca screening: DRE normal, PSA. Colon ca screening: next colonoscopy due 2025.  An After Visit Summary was printed and given to the patient.  FOLLOW UP:  Return in about 6 months (around 10/04/2020) for routine chronic illness f/u.  Signed:  Crissie Sickles, MD           04/03/2020

## 2020-04-03 NOTE — Addendum Note (Signed)
Addended by: Deveron Furlong D on: 04/03/2020 11:47 AM   Modules accepted: Orders

## 2020-04-04 LAB — IRON,TIBC AND FERRITIN PANEL
Ferritin: 56 ng/mL (ref 38–380)
Iron: 173 ug/dL (ref 50–180)

## 2020-04-04 LAB — HEMOGLOBIN A1C: Hgb A1c MFr Bld: 6 % (ref 4.6–6.5)

## 2020-04-15 ENCOUNTER — Telehealth: Payer: Self-pay

## 2020-04-15 NOTE — Telephone Encounter (Signed)
Patient requests most recent iron levels results.

## 2020-04-15 NOTE — Telephone Encounter (Signed)
Patient was notified of recent iron level results. I asked additional follow up question regarding increasing cholesterol med that was dicussed on 7/23. He expressed he was not satisfied because he felt PCP was just trying to shove medication down his throat by increasing it instead of collaborating with him and discussing this. He felt the provider should call and discuss instead of having the nurse call. He would be looking for another physician for better overall care. PCP was verbally made aware of this.

## 2020-04-22 ENCOUNTER — Other Ambulatory Visit: Payer: Self-pay | Admitting: Family Medicine

## 2020-05-28 ENCOUNTER — Other Ambulatory Visit: Payer: Self-pay

## 2020-05-28 ENCOUNTER — Telehealth (INDEPENDENT_AMBULATORY_CARE_PROVIDER_SITE_OTHER): Payer: BC Managed Care – PPO | Admitting: Family Medicine

## 2020-05-28 ENCOUNTER — Other Ambulatory Visit: Payer: Self-pay | Admitting: Family Medicine

## 2020-05-28 DIAGNOSIS — J989 Respiratory disorder, unspecified: Secondary | ICD-10-CM

## 2020-05-28 MED ORDER — DOXYCYCLINE HYCLATE 100 MG PO TABS: 100.0000 mg | ORAL_TABLET | Freq: Two times a day (BID) | ORAL | 0 refills | Status: DC

## 2020-05-28 NOTE — Patient Instructions (Signed)
-  I sent the medication(s) we discussed to your pharmacy: Meds ordered this encounter  Medications  . doxycycline (VIBRA-TABS) 100 MG tablet    Sig: Take 1 tablet (100 mg total) by mouth 2 (two) times daily.    Dispense:  20 tablet    Refill:  0    I hope you are feeling better soon! Seek care promptly if your symptoms worsen, new concerns arise or you are not improving with treatment.

## 2020-05-28 NOTE — Progress Notes (Signed)
Virtual Visit via Video Note  I connected with Thomas Spencer  on 05/28/20 at  6:20 PM EDT by a video enabled telemedicine application and verified that I am speaking with the correct person using two identifiers.  Location patient: home, [place] Location provider:work or home office Persons participating in the virtual visit: patient, provider  I discussed the limitations of evaluation and management by telemedicine and the availability of in person appointments. The patient expressed understanding and agreed to proceed.   HPI:  Acute visit for a cough: -started about 10 days ago after going out of town for a football game -symptoms include: cough, sinus  congestion, mucus - now thick and discolored, temp up to 99.1 a few time, feels and hot and cold, sore throat, body aches  -denies: NVD, loss of taste or smell, SOB, inability to get out of bed, eat or drink -as tried dextromethorphan, tylenol -denies any known sick contacts -fully vaccinated for covid with [employer] in April -denies any underlying lung disease     ROS: See pertinent positives and negatives per HPI.  Past Medical History:  Diagnosis Date  . Acute epiglottitis 01/2014  . BPH with obstruction/lower urinary tract symptoms    [facility] referral done 01/2011 (Dr. Risa Grill); ultimately a prostate biopsy was done for increased PSA velocity--April 2013--and it  was completely normal.  . CAD S/P percutaneous coronary angioplasty 05/05/2017   Progressive Angina: (3 V CAD on Cor Calcium Score) --> LEFT HEART CATH & CORONARY ANGIOGRAPHY  / CORONARY STENT INTERVENTION Conclusion  LESION #1: p-mLAD to Mid LAD 80 %s --> PCI SYNERGY DES 2.75X16 - Tapered post-dilation: 3.1-2.9 mm.  LESION #2: pRI 85 % - PCI SYNERGY DES 3X12.  Initial plan ASA/Plavix x 1 yr  --> ASA stopped 06/2017 for GI Bleed-- on Plavix alone and continued w/this 11/2017  . Elevated coronary artery calcium score 03/2017   indicating possible 3 vessel CAD: cardiac cath  per Dr. Ellyn Hack 8/22 revealed 2 Vessel obstructive CAD in LAD & RI   . Elevated liver function tests    mild  . Epididymal cyst 09/2019   Right (vs spermatocele.  Hematocele less likely since no hx of trauma).  . Erectile dysfunction   . Family history of colon cancer    father  . Family history of hypertension    strong family history  . Family history of premature coronary heart disease    strong family history; Mother (MI in 74s, then 25s, s/p Valve Sgx in 75s) & oldest Brother (CABG x 4 in 51s)  . History of adenomatous polyp of colon 2007; 2014; 08/2019   Dr. Henrene Pastor did 2020 scope.  Recall 2025.  Marland Kitchen History of sciatica 11/2013   responded well to prednisone burst 11/2013  . Hyperlipidemia    On 40 mg Crestor as of 06/2017  . Hypertension    On telmisartan  . Iron deficiency anemia 06/2017   Hb drop from 15 down to 11 from 04/2017-06/2017.  +H pylori gastritis+.  Recurrence 01/2019 +correlation with melena and fatigue.  Pt has been back on iron x 2 wks and is making arrangements to see his GI MD as of 02/08/19 (? repeat EGD + is overdue for repeat colonoscopy).  . Prediabetes 11/2016   HbA1c 6.1%     Past Surgical History:  Procedure Laterality Date  . CARDIOVASCULAR STRESS TEST  11/16/2019   Normal EF, no ischemia  . COLONOSCOPY W/ POLYPECTOMY  09/02/06; 07/2013; 08/2019   2014 [facility]: adenomatous polyp w/out high grade dysplasia or malignancy.  08/2019 adenoma x 2, diverticulosis, recall 5 yrs.  . coronary calcium score     Very high risk: as of 03/2017 pt is to return to discuss with Dr. Ellyn Hack: stress test vs cath as next step.  . CORONARY STENT INTERVENTION N/A 05/05/2017   DES x 2. Preserved LV function.  Procedure: CORONARY STENT INTERVENTION;  Surgeon: Leonie Man, MD;  Location: [facility];  Service: Cardiovascular;: CAD S/P PCI: p-mLAD Synergy DES 2.75 x 16 (3.1-2.9 mm), p-mRI Synergy DES 3.0 x 12 (3.3 mm)  . dental implants    . ESOPHAGOGASTRODUODENOSCOPY   07/08/2017   GI RECOMMENDED D/C ASPIRIN:  superfic non-bleeding ulcers ranging 5-62mm in size in antrum.  +H pylori +.  A few non-bleeding erosions in pre-pyloric region.  Normal duodenum.  Marland Kitchen LEFT HEART CATH AND CORONARY ANGIOGRAPHY N/A 05/05/2017   Procedure: LEFT HEART CATH AND CORONARY ANGIOGRAPHY;  Surgeon: Leonie Man, MD;  Location: [facility];  Service: Cardiovascular;  Laterality: N/A; p-m LAD 80%, mRI 90% --> 2 vessel PCI  . PROSTATE BIOPSY  01/2012   Normal    Family History  Problem Relation Age of Onset  . Heart attack Mother 15  . Heart disease Mother 38  . Hyperlipidemia Mother   . Hypertension Mother   . Valvular heart disease Mother 11       s/p MVR-CABG  . Cancer Father        colon  . Cancer Sister        cervical/ remission  . Hyperlipidemia Brother   . Hypertension Brother   . Coronary artery disease Brother 55       CABG x 4  . Diabetes Sister        type 2  . Hypertension Sister   . Hypertension Sister   . Diabetes Sister        type 2    SOCIAL HX: see hpi   Current Outpatient Medications:  .  clopidogrel (PLAVIX) 75 MG tablet, TAKE 1 TABLET (75 MG TOTAL) BY MOUTH DAILY WITH BREAKFAST., Disp: 90 tablet, Rfl: 1 .  doxycycline (VIBRA-TABS) 100 MG tablet, Take 1 tablet (100 mg total) by mouth 2 (two) times daily., Disp: 20 tablet, Rfl: 0 .  Multiple Vitamin (MULTIVITAMIN WITH MINERALS) TABS tablet, Take 1 tablet by mouth daily., Disp: , Rfl:  .  pantoprazole (PROTONIX) 40 MG tablet, Take 1 tablet (40 mg total) by mouth 2 (two) times daily., Disp: 180 tablet, Rfl: 1 .  rosuvastatin (CRESTOR) 20 MG tablet, TAKE 1 TABLET BY MOUTH EVERY DAY, Disp: 30 tablet, Rfl: 0 .  sildenafil (VIAGRA) 100 MG tablet, Take 1 tablet (100 mg total) by mouth daily as needed for erectile dysfunction., Disp: 6 tablet, Rfl: 2 .  telmisartan (MICARDIS) 40 MG tablet, TAKE 1 TABLET BY MOUTH EVERY DAY, Disp: 90 tablet, Rfl: 1  EXAM:  VITALS per patient if  applicable:  BP 456/25, P 96 T 98.2  GENERAL: alert, oriented, appears well and in no acute distress  HEENT: atraumatic, conjunttiva clear, no obvious abnormalities on inspection of external nose and ears  NECK: normal movements of the head and neck  LUNGS: on inspection no signs of respiratory distress, breathing rate appears normal, no obvious gross SOB, gasping or wheezing  CV: no obvious cyanosis  MS: moves all visible extremities without noticeable abnormality  PSYCH/NEURO: pleasant and cooperative, no obvious depression or anxiety, speech and  thought processing grossly intact  ASSESSMENT AND PLAN:  Discussed the following assessment and plan:  Respiratory illness  -we discussed possible serious and likely etiologies, options for evaluation and workup, limitations of telemedicine visit vs in person visit, treatment, treatment risks and precautions. Pt prefers to treat via telemedicine empirically rather then risking or undertaking an in person visit at this moment. Query VUIR, breakthrough covid19 vs other initially. However, with lingering symptoms and now discolored mucus he opted for treatment for empiric treatment for possible 2ndary sinusitis or other resp infection with doxy 100mg  bid x 10 days. Also discussed nasal saline, short course of afrin for 3 days. Discussed possibility of COVID19 and testing. Work/School slipped offered:declined Advised to seek prompt follow up telemedicine visit or in person care if worsening, new symptoms arise, or if is not improving with treatment.   I discussed the assessment and treatment plan with the patient. The patient was provided an opportunity to ask questions and all were answered. The patient agreed with the plan and demonstrated an understanding of the instructions.      Lucretia Kern, DO

## 2020-06-05 ENCOUNTER — Other Ambulatory Visit: Payer: Self-pay | Admitting: Family Medicine

## 2020-06-06 ENCOUNTER — Ambulatory Visit: Payer: BC Managed Care – PPO | Admitting: Cardiology

## 2020-07-09 ENCOUNTER — Ambulatory Visit (INDEPENDENT_AMBULATORY_CARE_PROVIDER_SITE_OTHER): Payer: BC Managed Care – PPO | Admitting: Cardiology

## 2020-07-09 ENCOUNTER — Encounter: Payer: Self-pay | Admitting: Cardiology

## 2020-07-09 ENCOUNTER — Other Ambulatory Visit: Payer: Self-pay

## 2020-07-09 VITALS — BP 134/80 | HR 62 | Ht 64.0 in | Wt 158.8 lb

## 2020-07-09 DIAGNOSIS — I252 Old myocardial infarction: Secondary | ICD-10-CM

## 2020-07-09 DIAGNOSIS — Z955 Presence of coronary angioplasty implant and graft: Secondary | ICD-10-CM

## 2020-07-09 DIAGNOSIS — I2510 Atherosclerotic heart disease of native coronary artery without angina pectoris: Secondary | ICD-10-CM

## 2020-07-09 DIAGNOSIS — I10 Essential (primary) hypertension: Secondary | ICD-10-CM | POA: Diagnosis not present

## 2020-07-09 DIAGNOSIS — E785 Hyperlipidemia, unspecified: Secondary | ICD-10-CM

## 2020-07-09 DIAGNOSIS — Z9861 Coronary angioplasty status: Secondary | ICD-10-CM

## 2020-07-09 MED ORDER — ROSUVASTATIN CALCIUM 40 MG PO TABS: 40.0000 mg | ORAL_TABLET | Freq: Every day | ORAL | 3 refills | Status: DC

## 2020-07-09 MED ORDER — HYDROCHLOROTHIAZIDE 25 MG PO TABS: 25.0000 mg | ORAL_TABLET | Freq: Every day | ORAL | 3 refills | Status: DC

## 2020-07-09 NOTE — Patient Instructions (Addendum)
Medication Instructions:   start taking hydrochlorothiazide 25 mg one tablet daily  increase Rosuvastatin to 40 mg  Daily   Stop taking 20 mg Rosuvastatin   *If you need a refill on your cardiac medications before your next appointment, please call your pharmacy*   Lab Work: * please have lab _BMP in one - two weeks   non fasting   in 4 month FEB  Lipid ,CMP  - fasting .   If you have labs (blood work) drawn today and your tests are completely normal, you will receive your results only by: Marland Kitchen MyChart Message (if you have MyChart) OR . A paper copy in the mail If you have any lab test that is abnormal or we need to change your treatment, we will call you to review the results.   Testing/Procedures: Not needed   Follow-Up: At [facility], you and your health needs are our priority.  As part of our continuing mission to provide you with exceptional heart care, we have created designated Provider Care Teams.  These Care Teams include your primary Cardiologist (physician) and Advanced Practice Providers (APPs -  Physician Assistants and Nurse Practitioners) who all work together to provide you with the care you need, when you need it.  We recommend signing up for the patient portal called "MyChart".  Sign up information is provided on this After Visit Summary.  MyChart is used to connect with patients for Virtual Visits (Telemedicine).  Patients are able to view lab/test results, encounter notes, upcoming appointments, etc.  Non-urgent messages can be sent to your provider as well.   To learn more about what you can do with MyChart, go to [email or web address].    Your next appointment:   6 month(s)  The format for your next appointment:   In Person  Provider:   Glenetta Hew, MD   Other Instructions You have been referred to  Cardiac  Food habits     Heart-Healthy Eating Plan Heart-healthy meal planning includes:  Eating less unhealthy fats.  Eating more healthy  fats.  Making other changes in your diet. Talk with your doctor or a diet specialist (dietitian) to create an eating plan that is right for you. What is my plan? Your doctor may recommend an eating plan that includes:  Total fat: ______% or less of total calories a day.  Saturated fat: ______% or less of total calories a day.  Cholesterol: less than _________mg a day. What are tips for following this plan? Cooking Avoid frying your food. Try to bake, boil, grill, or broil it instead. You can also reduce fat by:  Removing the skin from poultry.  Removing all visible fats from meats.  Steaming vegetables in water or broth. Meal planning   At meals, divide your plate into four equal parts: ? Fill one-half of your plate with vegetables and green salads. ? Fill one-fourth of your plate with whole grains. ? Fill one-fourth of your plate with lean protein foods.  Eat 4-5 servings of vegetables per day. A serving of vegetables is: ? 1 cup of raw or cooked vegetables. ? 2 cups of raw leafy greens.  Eat 4-5 servings of fruit per day. A serving of fruit is: ? 1 medium whole fruit. ?  cup of dried fruit. ?  cup of fresh, frozen, or canned fruit. ?  cup of 100% fruit juice.  Eat more foods that have soluble fiber. These are apples, broccoli, carrots, beans, peas, and barley. Try to get 20-30  g of fiber per day.  Eat 4-5 servings of nuts, legumes, and seeds per week: ? 1 serving of dried beans or legumes equals  cup after being cooked. ? 1 serving of nuts is  cup. ? 1 serving of seeds equals 1 tablespoon. General information  Eat more home-cooked food. Eat less restaurant, buffet, and fast food.  Limit or avoid alcohol.  Limit foods that are high in starch and sugar.  Avoid fried foods.  Lose weight if you are overweight.  Keep track of how much salt (sodium) you eat. This is important if you have high blood pressure. Ask your doctor to tell you more about this.  Try  to add vegetarian meals each week. Fats  Choose healthy fats. These include olive oil and canola oil, flaxseeds, walnuts, almonds, and seeds.  Eat more omega-3 fats. These include salmon, mackerel, sardines, tuna, flaxseed oil, and ground flaxseeds. Try to eat fish at least 2 times each week.  Check food labels. Avoid foods with trans fats or high amounts of saturated fat.  Limit saturated fats. ? These are often found in animal products, such as meats, butter, and cream. ? These are also found in plant foods, such as palm oil, palm kernel oil, and coconut oil.  Avoid foods with partially hydrogenated oils in them. These have trans fats. Examples are stick margarine, some tub margarines, cookies, crackers, and other baked goods. What foods can I eat? Fruits All fresh, canned (in natural juice), or frozen fruits. Vegetables Fresh or frozen vegetables (raw, steamed, roasted, or grilled). Green salads. Grains Most grains. Choose whole wheat and whole grains most of the time. Rice and pasta, including brown rice and pastas made with whole wheat. Meats and other proteins Lean, well-trimmed beef, veal, pork, and lamb. Chicken and [place] without skin. All fish and shellfish. Wild duck, rabbit, pheasant, and venison. Egg whites or low-cholesterol egg substitutes. Dried beans, peas, lentils, and tofu. Seeds and most nuts. Dairy Low-fat or nonfat cheeses, including ricotta and mozzarella. Skim or 1% milk that is liquid, powdered, or evaporated. Buttermilk that is made with low-fat milk. Nonfat or low-fat yogurt. Fats and oils Non-hydrogenated (trans-free) margarines. Vegetable oils, including soybean, sesame, sunflower, olive, peanut, safflower, corn, canola, and cottonseed. Salad dressings or mayonnaise made with a vegetable oil. Beverages Mineral water. Coffee and tea. Diet carbonated beverages. Sweets and desserts Sherbet, gelatin, and fruit ice. Small amounts of dark chocolate. Limit all  sweets and desserts. Seasonings and condiments All seasonings and condiments. The items listed above may not be a complete list of foods and drinks you can eat. Contact a dietitian for more options. What foods should I avoid? Fruits Canned fruit in heavy syrup. Fruit in cream or butter sauce. Fried fruit. Limit coconut. Vegetables Vegetables cooked in cheese, cream, or butter sauce. Fried vegetables. Grains Breads that are made with saturated or trans fats, oils, or whole milk. Croissants. Sweet rolls. Donuts. High-fat crackers, such as cheese crackers. Meats and other proteins Fatty meats, such as hot dogs, ribs, sausage, bacon, rib-eye roast or steak. High-fat deli meats, such as salami and bologna. Caviar. Domestic duck and goose. Organ meats, such as liver. Dairy Cream, sour cream, cream cheese, and creamed cottage cheese. Whole-milk cheeses. Whole or 2% milk that is liquid, evaporated, or condensed. Whole buttermilk. Cream sauce or high-fat cheese sauce. Yogurt that is made from whole milk. Fats and oils Meat fat, or shortening. Cocoa butter, hydrogenated oils, palm oil, coconut oil, palm kernel oil. Solid  fats and shortenings, including bacon fat, salt pork, lard, and butter. Nondairy cream substitutes. Salad dressings with cheese or sour cream. Beverages Regular sodas and juice drinks with added sugar. Sweets and desserts Frosting. Pudding. Cookies. Cakes. Pies. Milk chocolate or white chocolate. Buttered syrups. Full-fat ice cream or ice cream drinks. The items listed above may not be a complete list of foods and drinks to avoid. Contact a dietitian for more information. Summary  Heart-healthy meal planning includes eating less unhealthy fats, eating more healthy fats, and making other changes in your diet.  Eat a balanced diet. This includes fruits and vegetables, low-fat or nonfat dairy, lean protein, nuts and legumes, whole grains, and heart-healthy oils and fats. This  information is not intended to replace advice given to you by your health care provider. Make sure you discuss any questions you have with your health care provider. Document Revised: 11/04/2017 Document Reviewed: 10/08/2017 Elsevier Patient Education  2020 [employer].

## 2020-07-09 NOTE — Progress Notes (Signed)
Primary Care Provider: Tammi Sou, MD Cardiologist: Glenetta Hew, MD Electrophysiologist: None  Clinic Note: Chief Complaint  Patient presents with   Follow-up    Next month; Myoview result   Coronary Artery Disease    No angina   Hypertension    He notes that at home his blood pressures range in the 135/80-140/85 mmHg range.    Problem List Items Addressed This Visit    CAD S/P PCI: p-mLAD Synergy DES 2.75 x 16 (3.1-2.9 mm), p-mRI Synergy DES 3.0 x 12 (3.3 mm) - Primary (Chronic)    3 years out from two-vessel PCI I for progressive angina symptoms.  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  He is active with no labs drawn anginal symptoms now.  No more than chest pain that he had back in February.  Plan:   Continue rosuvastatin, but would like to increase dose to 40 mg  Continue Micardis  Continue maintenance Plavix for two-vessel PCI.  Okay to hold Plavix 5 days preop for procedures or surgeries (other than cardiac cath).       Relevant Medications   hydrochlorothiazide (HYDRODIURIL) 25 MG tablet   rosuvastatin (CRESTOR) 40 MG tablet   Other Relevant Orders   Lipid panel   Comprehensive metabolic panel   Basic metabolic panel   Amb Referral to Nutrition and Diabetic E   Hyperlipidemia with target low density lipoprotein (LDL) cholesterol less than 70 mg/dL (Chronic)    His previous LDL was 39 on 20 Lipitor.  Unfortunately, as of July, his LDL is now up to 90.  Somewhat unusual, and may have been related to change in his diet.  Plan: We will try to increase to 40 mg rosuvastatin and reassess in roughly 3 months.  If that is tolerated we will probably consider Nexlizet (Nexletol plus ezetimibe)      Relevant Medications   hydrochlorothiazide (HYDRODIURIL) 25 MG tablet   rosuvastatin (CRESTOR) 40 MG tablet   Other Relevant Orders    Comprehensive metabolic panel   Amb Referral to Nutrition and Diabetic E   Essential hypertension (Chronic)    He tells me his blood pressure at home is about the same range and is here.  Probably averaging but would now be considered stage I hypertension by the new guidelines.  I would like to see a little better blood pressure control.  We will try to start low-dose HCTZ along with his Micardis.  Avoiding beta-blocker because of resting bradycardia and fatigue.  He asked about potentially being referred for nutrition counseling.  We provided some information in the AVS.  We will see about referral.      Relevant Medications   hydrochlorothiazide (HYDRODIURIL) 25 MG tablet   rosuvastatin (CRESTOR) 40 MG tablet   Other Relevant Orders   Comprehensive metabolic panel   Basic metabolic panel   History of myocardial infarction (Chronic)    Clear fixed defect noted on Myoview both inferoseptal and anteroseptal suggesting probably LAD related small microinfarction.  Perhaps some of his progressive anginal symptoms were actually causing myocardial injury.  No residual ischemia noted in relatively small defect, but clearly there does appear to be myocardial infarction.  I went through this in detail with him and have explained to him the pathophysiology.  He was somewhat confused and taken back, but after we discussed today Concerta made sense based on the  level symptoms he was having prior to PCI.      Presence of drug coated stent in LAD coronary artery (Chronic)    DES PCI to the LAD and ramus intermedius.  On maintenance dose Plavix which can be held for procedure 5 days preop      Relevant Orders   Comprehensive metabolic panel   Amb Referral to Nutrition and Diabetic E     HPI:    Thomas Spencer is a 54 y.o. male with a PMH below who presents today for delayed 53-month follow-up.  Thomas Spencer was last seen by me on August 31, 2019 -> he was doing well.  No complaints of chest pain  or pressure with rest or exertion.  Energy level doing well.  He felt much better being off aspirin with less heartburn and less bruising.  No changes made.  He was seen by Almyra Deforest, PA on November 10, 2019 with chest discomfort.  Evaluated with Myoview stress test  Recent Hospitalizations: None  Reviewed  CV studies:    The following studies were reviewed today: (if available, images/films reviewed: From Epic Chart or Care Everywhere)  Myoview Stress Test 11/16/2019: Fixed basal inferoseptal and mid anteroseptal perfusion defect consistent with subendocardial scar with no ischemia..  EF 52%.  Uninterpretable EKG.  LOW RISK.  Interval History:   Thomas Spencer returns today with no major complaints.  He had a stress test done back in March that showed no evidence of any ischemia, but did show suggestion of prior anterior infarct.  He is no longer any chest pain or pressure.  CV Review of Symptoms (Summary): no chest pain or dyspnea on exertion positive for - A little bit of fatigue.  Weight has been up and down.  His change his diet. negative for - edema, irregular heartbeat, orthopnea, palpitations, paroxysmal nocturnal dyspnea, rapid heart rate, shortness of breath or Syncope/near syncope, TIA/amaurosis fugax or claudication  The patient does not have symptoms concerning for COVID-19 infection (fever, chills, cough, or new shortness of breath).   REVIEWED OF SYSTEMS   Review of Systems  Constitutional: Negative for malaise/fatigue (Maybe a little bit of exercise intolerance) and weight loss (Weight has been going up and down).  HENT: Negative for congestion and nosebleeds.   Respiratory: Negative for shortness of breath.   Gastrointestinal: Negative for blood in stool and melena.  Genitourinary: Negative for hematuria.  Musculoskeletal: Negative.   Neurological: Negative for dizziness and headaches.  Psychiatric/Behavioral: Negative.    I have reviewed and (if needed) personally  updated the patient's problem list, medications, allergies, past medical and surgical history, social and family history.   PAST MEDICAL HISTORY   Past Medical History:  Diagnosis Date   Acute epiglottitis 01/2014   BPH with obstruction/lower urinary tract symptoms    [facility] referral done 01/2011 (Dr. Risa Grill); ultimately a prostate biopsy was done for increased PSA velocity--April 2013--and it  was completely normal.   CAD S/P percutaneous coronary angioplasty 05/05/2017   Progressive Angina: (3 V CAD on Cor Calcium Score) --> LEFT HEART CATH & CORONARY ANGIOGRAPHY  / CORONARY STENT INTERVENTION Conclusion  LESION #1: p-mLAD to Mid LAD 80 %s --> PCI SYNERGY DES 2.75X16 - Tapered post-dilation: 3.1-2.9 mm.  LESION #2: pRI 85 % - PCI SYNERGY DES 3X12.  Initial plan ASA/Plavix x 1 yr  --> ASA stopped 06/2017 for GI Bleed-- on Plavix alone and continued w/this 11/2017   Elevated coronary artery calcium score 03/2017   indicating  possible 3 vessel CAD: cardiac cath per Dr. Ellyn Hack 8/22 revealed 2 Vessel obstructive CAD in LAD & RI    Elevated liver function tests    mild   Epididymal cyst 09/2019   Right (vs spermatocele.  Hematocele less likely since no hx of trauma).   Erectile dysfunction    Family history of colon cancer    father   Family history of hypertension    strong family history   Family history of premature coronary heart disease    strong family history; Mother (MI in 30s, then 33s, s/p Valve Sgx in 5s) & oldest Brother (CABG x 4 in 81s)   History of adenomatous polyp of colon 2007; 2014; 08/2019   Dr. Henrene Pastor did 2020 scope.  Recall 2025.   History of sciatica 11/2013   responded well to prednisone burst 11/2013   Hyperlipidemia    On 40 mg Crestor as of 06/2017   Hypertension    On telmisartan   Iron deficiency anemia 06/2017   Hb drop from 15 down to 11 from 04/2017-06/2017.  +H pylori gastritis+.  Recurrence 01/2019 +correlation with melena and  fatigue.  Pt has been back on iron x 2 wks and is making arrangements to see his GI MD as of 02/08/19 (? repeat EGD + is overdue for repeat colonoscopy).   Prediabetes 11/2016   HbA1c 6.1%     PAST SURGICAL HISTORY   Past Surgical History:  Procedure Laterality Date   CARDIOVASCULAR STRESS TEST  11/16/2019   Normal EF, no ischemia   COLONOSCOPY W/ POLYPECTOMY  09/02/06; 07/2013; 08/2019   2014 [facility]: adenomatous polyp w/out high grade dysplasia or malignancy.  08/2019 adenoma x 2, diverticulosis, recall 5 yrs.   coronary calcium score     Very high risk: as of 03/2017 pt is to return to discuss with Dr. Ellyn Hack: stress test vs cath as next step.   CORONARY STENT INTERVENTION N/A 05/05/2017   DES x 2. Preserved LV function.  Procedure: CORONARY STENT INTERVENTION;  Surgeon: Leonie Man, MD;  Location: [facility];  Service: Cardiovascular;: CAD S/P PCI: p-mLAD Synergy DES 2.75 x 16 (3.1-2.9 mm), p-mRI Synergy DES 3.0 x 12 (3.3 mm)   dental implants     ESOPHAGOGASTRODUODENOSCOPY  07/08/2017   GI RECOMMENDED D/C ASPIRIN:  superfic non-bleeding ulcers ranging 5-86mm in size in antrum.  +H pylori +.  A few non-bleeding erosions in pre-pyloric region.  Normal duodenum.   LEFT HEART CATH AND CORONARY ANGIOGRAPHY N/A 05/05/2017   Procedure: LEFT HEART CATH AND CORONARY ANGIOGRAPHY;  Surgeon: Leonie Man, MD;  Location: [facility];  Service: Cardiovascular;  Laterality: N/A; p-m LAD 80%, mRI 90% --> 2 vessel PCI   NM MYOVIEW LTD  11/16/2019   Fixed basal inferoseptal and mid anteroseptal perfusion defect consistent with subendocardial scar, NO ISCHEMIA.  EF 52%.  Unable to interpret EKG.  LOW Risk.   PROSTATE BIOPSY  01/2012   Normal    05/05/2017 Pre & Post-Intervention Diagram: Synergy DES PCI x 2; LAD-:2.75x 16 (~3.0); RI - 3.0 x 12 (3.3 mm)                Immunization History  Administered Date(s) Administered   Influenza,inj,Quad PF,6+ Mos 07/03/2014,  07/01/2017, 07/20/2019   Influenza-Unspecified 06/28/2015   Janssen (J&J) SARS-COV-2 Vaccination 11/25/2019   Td 01/17/2008   Tdap 03/02/2020   Zoster Recombinat (Shingrix) 04/03/2020    MEDICATIONS/ALLERGIES   Current Meds  Medication  Sig   clopidogrel (PLAVIX) 75 MG tablet TAKE 1 TABLET (75 MG TOTAL) BY MOUTH DAILY WITH BREAKFAST.   Multiple Vitamin (MULTIVITAMIN WITH MINERALS) TABS tablet Take 1 tablet by mouth daily.   pantoprazole (PROTONIX) 40 MG tablet Take 40 mg by mouth daily. 1 tab daily   sildenafil (VIAGRA) 100 MG tablet Take 1 tablet (100 mg total) by mouth daily as needed for erectile dysfunction.   telmisartan (MICARDIS) 40 MG tablet TAKE 1 TABLET BY MOUTH EVERY DAY   [DISCONTINUED] pantoprazole (PROTONIX) 40 MG tablet Take 1 tablet (40 mg total) by mouth 2 (two) times daily.   [DISCONTINUED] rosuvastatin (CRESTOR) 20 MG tablet TAKE 1 TABLET BY MOUTH EVERY DAY    Allergies  Allergen Reactions   Ace Inhibitors Cough   Shrimp [Shellfish Allergy] Other (See Comments)    Swelling around eyes/lips---can eat IF cooked WELL    SOCIAL HISTORY/FAMILY HISTORY   Reviewed in Epic:  Pertinent findings: N/A  OBJCTIVE -PE, EKG, labs   Wt Readings from Last 3 Encounters:  07/18/20 159 lb 9.6 oz (72.4 kg)  07/09/20 158 lb 12.8 oz (72 kg)  04/03/20 158 lb 9.6 oz (71.9 kg)    Physical Exam: BP 134/80    Pulse 62    Ht 5\' 4"  (1.626 m)    Wt 158 lb 12.8 oz (72 kg)    BMI 27.26 kg/m  Physical Exam Vitals reviewed.  Constitutional:      General: He is not in acute distress.    Appearance: Normal appearance. He is normal weight. He is not ill-appearing or toxic-appearing.  HENT:     Head: Normocephalic and atraumatic.  Neck:     Vascular: No carotid bruit or hepatojugular reflux.  Cardiovascular:     Rate and Rhythm: Normal rate and regular rhythm.  No extrasystoles are present.    Pulses: Normal pulses.     Heart sounds: Normal heart sounds. No murmur  heard.  No friction rub. No gallop.   Pulmonary:     Effort: Pulmonary effort is normal. No respiratory distress.     Breath sounds: Normal breath sounds.  Chest:     Chest wall: No tenderness.  Musculoskeletal:        General: No swelling. Normal range of motion.     Cervical back: Normal range of motion and neck supple.  Neurological:     General: No focal deficit present.     Mental Status: He is alert and oriented to person, place, and time.  Psychiatric:        Mood and Affect: Mood normal.        Behavior: Behavior normal.        Thought Content: Thought content normal.        Judgment: Judgment normal.     Adult ECG Report N/a  Recent Labs:    Lab Results  Component Value Date   CHOL 153 04/03/2020   HDL 35.90 (L) 04/03/2020   LDLCALC 90 04/03/2020   LDLDIRECT 72.0 12/08/2016   TRIG 135.0 04/03/2020   CHOLHDL 4 04/03/2020   Lab Results  Component Value Date   CREATININE 1.31 (H) 07/18/2020   BUN 22 07/18/2020   NA 137 07/18/2020   K 4.2 07/18/2020   CL 95 (L) 07/18/2020   CO2 30 (H) 07/18/2020   Lab Results  Component Value Date   TSH 1.20 04/03/2020    ASSESSMENT/PLAN   Thomas Spencer is overall doing fairly well from a cardiac standpoint.  His blood  pressures are little high today, and his lipid levels have gone up. Interestingly his stress test suggested prior infarction which we reviewed.  Thankfully, EF is stable.  He is on stable regimen for now with maintenance dose Plavix and Micardis along with rosuvastatin.  Plan:  BP log of the next 3 months  Add HCTZ 25 mg -> BMP in 2 weeks, then in 4 months with lipid panel  Increase rosuvastatin to 40 mg daily  -> lipid panel in 4 months.   COVID-19 Education: The signs and symptoms of COVID-19 were discussed with the patient and how to seek care for testing (follow up with PCP or arrange E-visit).   The importance of social distancing and COVID-19 vaccination was discussed today. The patient is  practicing social distancing & Masking.   I spent a total of 37minutes with the patient spent in direct patient consultation.  Additional time spent with chart review  / charting (studies, outside notes, etc): 14 Total Time: 53 min   Current medicines are reviewed at length with the patient today.  (+/- concerns) n/a  This visit occurred during the SARS-CoV-2 public health emergency.  Safety protocols were in place, including screening questions prior to the visit, additional usage of staff PPE, and extensive cleaning of exam room while observing appropriate contact time as indicated for disinfecting solutions.  Notice: This dictation was prepared with Dragon dictation along with smaller phrase technology. Any transcriptional errors that result from this process are unintentional and may not be corrected upon review.  Patient Instructions / Medication Changes & Studies & Tests Ordered   Patient Instructions  Medication Instructions:   start taking hydrochlorothiazide 25 mg one tablet daily  increase Rosuvastatin to 40 mg  Daily   Stop taking 20 mg Rosuvastatin   *If you need a refill on your cardiac medications before your next appointment, please call your pharmacy*   Lab Work: * please have lab _BMP in one - two weeks   non fasting   in 4 month FEB  Lipid ,CMP  - fasting .   If you have labs (blood work) drawn today and your tests are completely normal, you will receive your results only by:  [place] (if you have MyChart) OR  A paper copy in the mail If you have any lab test that is abnormal or we need to change your treatment, we will call you to review the results.   Testing/Procedures: Not needed   Follow-Up: At [facility], you and your health needs are our priority.  As part of our continuing mission to provide you with exceptional heart care, we have created designated Provider Care Teams.  These Care Teams include your primary Cardiologist (physician)  and Advanced Practice Providers (APPs -  Physician Assistants and Nurse Practitioners) who all work together to provide you with the care you need, when you need it.  We recommend signing up for the patient portal called "MyChart".  Sign up information is provided on this After Visit Summary.  MyChart is used to connect with patients for Virtual Visits (Telemedicine).  Patients are able to view lab/test results, encounter notes, upcoming appointments, etc.  Non-urgent messages can be sent to your provider as well.   To learn more about what you can do with MyChart, go to [email or web address].    Your next appointment:   6 month(s)  The format for your next appointment:   In Person  Provider:   Glenetta Hew, MD   Other Instructions  You have been referred to  Cardiac  Food habits   Studies Ordered:   Orders Placed This Encounter  Procedures   Lipid panel   Comprehensive metabolic panel   Basic metabolic panel   Amb Referral to Nutrition and Diabetic E     Glenetta Hew, M.D., M.S. Interventional Cardiologist   Pager # [phone] Phone # [phone] [address]. [place], [place] [address]   Thank you for choosing Heartcare at [facility]!!

## 2020-07-11 ENCOUNTER — Telehealth (INDEPENDENT_AMBULATORY_CARE_PROVIDER_SITE_OTHER): Payer: BC Managed Care – PPO | Admitting: Family Medicine

## 2020-07-11 ENCOUNTER — Encounter: Payer: Self-pay | Admitting: Family Medicine

## 2020-07-11 DIAGNOSIS — J9801 Acute bronchospasm: Secondary | ICD-10-CM | POA: Diagnosis not present

## 2020-07-11 DIAGNOSIS — J069 Acute upper respiratory infection, unspecified: Secondary | ICD-10-CM | POA: Diagnosis not present

## 2020-07-11 MED ORDER — PREDNISONE 10 MG PO TABS: ORAL_TABLET | ORAL | 0 refills | Status: DC

## 2020-07-11 MED ORDER — ALBUTEROL SULFATE HFA 108 (90 BASE) MCG/ACT IN AERS: 2.0000 | INHALATION_SPRAY | RESPIRATORY_TRACT | 0 refills | Status: DC | PRN

## 2020-07-11 NOTE — Progress Notes (Signed)
Virtual Visit via Video Note  I connected with Thomas Spencer on 07/11/20 at  8:30 AM EDT by a video enabled telemedicine application and verified that I am speaking with the correct person using two identifiers.  Location patient: home Location provider:work or home office Persons participating in the virtual visit: patient, provider  I discussed the limitations of evaluation and management by telemedicine and the availability of in person appointments. The patient expressed understanding and agreed to proceed.  Telemedicine visit is a necessity given the COVID-19 restrictions in place at the current time.  HPI: 54 y/o male with CAD, HTN, HLD, and GERD being seen today for cough. He saw Dr. Maudie Mercury about 6 wks ago, virtual, had URI with cough that was lingering >10d so doxy was rx'd---10d.  Afrin and nasal saline recommended as well.  Since that visit: Thomas Spencer describes the uri with cough being pretty bad for a couple weeks.  Upper resp sx's cleared up except feeling some phlegm in back of throat pretty consistently.  The doxy helped significantly. Cough is better as well, clears throat a lot.  Talking and laughing makes him cough. Throat feeling raw from coughing.  No fever, wheezing, or sob.  Cough is nonproductive. Occ use of otc cough med minimally helpful lately.  ROS: no chest pain.  No dizziness.  No n/v/d or abd pain. Feeling possibly more rising GER sensation in back of throat with resp illness persisting lately No rash, no HA, no palpitations.  Past Medical History:  Diagnosis Date  . Acute epiglottitis 01/2014  . BPH with obstruction/lower urinary tract symptoms    [facility] referral done 01/2011 (Dr. Risa Grill); ultimately a prostate biopsy was done for increased PSA velocity--April 2013--and it  was completely normal.  . CAD S/P percutaneous coronary angioplasty 05/05/2017   Progressive Angina: (3 V CAD on Cor Calcium Score) --> LEFT HEART CATH & CORONARY ANGIOGRAPHY  / CORONARY STENT  INTERVENTION Conclusion  LESION #1: p-mLAD to Mid LAD 80 %s --> PCI SYNERGY DES 2.75X16 - Tapered post-dilation: 3.1-2.9 mm.  LESION #2: pRI 85 % - PCI SYNERGY DES 3X12.  Initial plan ASA/Plavix x 1 yr  --> ASA stopped 06/2017 for GI Bleed-- on Plavix alone and continued w/this 11/2017  . Elevated coronary artery calcium score 03/2017   indicating possible 3 vessel CAD: cardiac cath per Dr. Ellyn Hack 8/22 revealed 2 Vessel obstructive CAD in LAD & RI   . Elevated liver function tests    mild  . Epididymal cyst 09/2019   Right (vs spermatocele.  Hematocele less likely since no hx of trauma).  . Erectile dysfunction   . Family history of colon cancer    father  . Family history of hypertension    strong family history  . Family history of premature coronary heart disease    strong family history; Mother (MI in 90s, then 12s, s/p Valve Sgx in 58s) & oldest Brother (CABG x 4 in 17s)  . History of adenomatous polyp of colon 2007; 2014; 08/2019   Dr. Henrene Pastor did 2020 scope.  Recall 2025.  Marland Kitchen History of sciatica 11/2013   responded well to prednisone burst 11/2013  . Hyperlipidemia    On 40 mg Crestor as of 06/2017  . Hypertension    On telmisartan  . Iron deficiency anemia 06/2017   Hb drop from 15 down to 11 from 04/2017-06/2017.  +H pylori gastritis+.  Recurrence 01/2019 +correlation with melena and fatigue.  Thomas Spencer has been back on iron x 2  wks and is making arrangements to see his GI MD as of 02/08/19 (? repeat EGD + is overdue for repeat colonoscopy).  . Prediabetes 11/2016   HbA1c 6.1%     Past Surgical History:  Procedure Laterality Date  . CARDIOVASCULAR STRESS TEST  11/16/2019   Normal EF, no ischemia  . COLONOSCOPY W/ POLYPECTOMY  09/02/06; 07/2013; 08/2019   2014 [facility]: adenomatous polyp w/out high grade dysplasia or malignancy.  08/2019 adenoma x 2, diverticulosis, recall 5 yrs.  . coronary calcium score     Very high risk: as of 03/2017 Thomas Spencer is to return to discuss with Dr. Ellyn Hack:  stress test vs cath as next step.  . CORONARY STENT INTERVENTION N/A 05/05/2017   DES x 2. Preserved LV function.  Procedure: CORONARY STENT INTERVENTION;  Surgeon: Leonie Man, MD;  Location: [facility];  Service: Cardiovascular;: CAD S/P PCI: p-mLAD Synergy DES 2.75 x 16 (3.1-2.9 mm), p-mRI Synergy DES 3.0 x 12 (3.3 mm)  . dental implants    . ESOPHAGOGASTRODUODENOSCOPY  07/08/2017   GI RECOMMENDED D/C ASPIRIN:  superfic non-bleeding ulcers ranging 5-15mm in size in antrum.  +H pylori +.  A few non-bleeding erosions in pre-pyloric region.  Normal duodenum.  Marland Kitchen LEFT HEART CATH AND CORONARY ANGIOGRAPHY N/A 05/05/2017   Procedure: LEFT HEART CATH AND CORONARY ANGIOGRAPHY;  Surgeon: Leonie Man, MD;  Location: [facility];  Service: Cardiovascular;  Laterality: N/A; p-m LAD 80%, mRI 90% --> 2 vessel PCI  . PROSTATE BIOPSY  01/2012   Normal     Current Outpatient Medications:  .  clopidogrel (PLAVIX) 75 MG tablet, TAKE 1 TABLET (75 MG TOTAL) BY MOUTH DAILY WITH BREAKFAST., Disp: 90 tablet, Rfl: 1 .  hydrochlorothiazide (HYDRODIURIL) 25 MG tablet, Take 1 tablet (25 mg total) by mouth daily., Disp: 90 tablet, Rfl: 3 .  Multiple Vitamin (MULTIVITAMIN WITH MINERALS) TABS tablet, Take 1 tablet by mouth daily., Disp: , Rfl:  .  pantoprazole (PROTONIX) 40 MG tablet, Take 40 mg by mouth daily. 1 tab daily, Disp: , Rfl:  .  rosuvastatin (CRESTOR) 40 MG tablet, Take 1 tablet (40 mg total) by mouth daily., Disp: 90 tablet, Rfl: 3 .  sildenafil (VIAGRA) 100 MG tablet, Take 1 tablet (100 mg total) by mouth daily as needed for erectile dysfunction., Disp: 6 tablet, Rfl: 2 .  telmisartan (MICARDIS) 40 MG tablet, TAKE 1 TABLET BY MOUTH EVERY DAY, Disp: 90 tablet, Rfl: 1 .  albuterol (VENTOLIN HFA) 108 (90 Base) MCG/ACT inhaler, Inhale 2 puffs into the lungs every 4 (four) hours as needed for wheezing or shortness of breath., Disp: 1 each, Rfl: 0 .  predniSONE (DELTASONE) 10 MG tablet, 5 tabs  po qd x 2 days, then 4 tabs po qd x 2d, then 3 tabs po qd x 2d, then 2 tabs po qd x 2d, then 1 tab po qd x 2d, Disp: 30 tablet, Rfl: 0  EXAM:  VITALS per patient if applicable:  Vitals with BMI 07/09/2020 04/03/2020 11/16/2019  Height 5\' 4"  5\' 5"  5\' 5"   Weight 158 lbs 13 oz 158 lbs 10 oz 166 lbs  BMI 27.24 18.56 31.49  Systolic 702 637 -  Diastolic 80 80 -  Pulse 62 59 -     GENERAL: alert, oriented, appears well and in no acute distress  HEENT: atraumatic, conjunttiva clear, no obvious abnormalities on inspection of external nose and ears  NECK: normal movements of the head and neck  LUNGS: on inspection no signs of respiratory distress, breathing rate appears normal, no obvious gross SOB, gasping or wheezing  CV: no obvious cyanosis  MS: moves all visible extremities without noticeable abnormality  PSYCH/NEURO: pleasant and cooperative, no obvious depression or anxiety, speech and thought processing grossly intact  LABS: none today  Lab Results  Component Value Date   TSH 1.20 04/03/2020   Lab Results  Component Value Date   WBC 4.1 04/03/2020   HGB 16.1 04/03/2020   HCT 47.1 04/03/2020   MCV 94.9 04/03/2020   PLT 146.0 (L) 04/03/2020   Lab Results  Component Value Date   CREATININE 1.44 04/03/2020   BUN 18 04/03/2020   NA 137 04/03/2020   K 4.7 04/03/2020   CL 101 04/03/2020   CO2 30 04/03/2020   Lab Results  Component Value Date   ALT 55 (H) 04/03/2020   AST 36 04/03/2020   ALKPHOS 50 04/03/2020   BILITOT 0.7 04/03/2020   Lab Results  Component Value Date   CHOL 153 04/03/2020   Lab Results  Component Value Date   HDL 35.90 (L) 04/03/2020   Lab Results  Component Value Date   LDLCALC 90 04/03/2020   Lab Results  Component Value Date   TRIG 135.0 04/03/2020   Lab Results  Component Value Date   CHOLHDL 4 04/03/2020   Lab Results  Component Value Date   PSA 0.56 04/03/2020   PSA 0.64 02/07/2019   PSA 0.68 12/08/2016   Lab Results   Component Value Date   HGBA1C 6.0 04/03/2020   ASSESSMENT AND PLAN:  Discussed the following assessment and plan:  URI with cough, lingering bronchospasm cough. Discussed likely component of GER (due to wide fluctuations in intragastric pressure when he is coughing) contributing to his throat phlegm, tendency to trigger more coughing, etc. Plan: prednisone 50-40-30-20-10 over the next 10d. Albuterol hfa 2p q4h prn. Inc protonix to 40mg  BID short term.  Of note, his cardiologist did increase his crestor to 40mg  daily based on his labs at our office about 3 mo ago.  Cardiologist has plan for f/u of Thomas Spencer's lipids and liver/kidney panels. -we discussed possible serious and likely etiologies, options for evaluation and workup, limitations of telemedicine visit vs in person visit, treatment, treatment risks and precautions. Thomas Spencer prefers to treat via telemedicine empirically rather than in person at this moment.   I discussed the assessment and treatment plan with the patient. The patient was provided an opportunity to ask questions and all were answered. The patient agreed with the plan and demonstrated an understanding of the instructions.    F/u: if not improving signif in 10-14d  Signed:  Crissie Sickles, MD           07/11/2020

## 2020-07-18 ENCOUNTER — Other Ambulatory Visit: Payer: Self-pay

## 2020-07-18 ENCOUNTER — Encounter: Payer: Self-pay | Admitting: Cardiology

## 2020-07-18 ENCOUNTER — Ambulatory Visit: Payer: BC Managed Care – PPO | Admitting: Cardiology

## 2020-07-18 VITALS — BP 125/72 | HR 90 | Temp 95.4°F | Ht 64.0 in | Wt 159.6 lb

## 2020-07-18 DIAGNOSIS — Z955 Presence of coronary angioplasty implant and graft: Secondary | ICD-10-CM

## 2020-07-18 DIAGNOSIS — R002 Palpitations: Secondary | ICD-10-CM

## 2020-07-18 DIAGNOSIS — Z9861 Coronary angioplasty status: Secondary | ICD-10-CM

## 2020-07-18 DIAGNOSIS — E785 Hyperlipidemia, unspecified: Secondary | ICD-10-CM

## 2020-07-18 DIAGNOSIS — I2510 Atherosclerotic heart disease of native coronary artery without angina pectoris: Secondary | ICD-10-CM | POA: Diagnosis not present

## 2020-07-18 DIAGNOSIS — Z79899 Other long term (current) drug therapy: Secondary | ICD-10-CM | POA: Diagnosis not present

## 2020-07-18 DIAGNOSIS — I10 Essential (primary) hypertension: Secondary | ICD-10-CM

## 2020-07-18 LAB — BASIC METABOLIC PANEL
BUN/Creatinine Ratio: 17 (ref 9–20)
BUN: 22 mg/dL (ref 6–24)
CO2: 30 mmol/L — ABNORMAL HIGH (ref 20–29)
Calcium: 9.9 mg/dL (ref 8.7–10.2)
Chloride: 95 mmol/L — ABNORMAL LOW (ref 96–106)
Creatinine, Ser: 1.31 mg/dL — ABNORMAL HIGH (ref 0.76–1.27)
GFR calc Af Amer: 70 mL/min/{1.73_m2} (ref >59)
GFR calc non Af Amer: 60 mL/min/{1.73_m2} (ref >59)
Glucose: 124 mg/dL — ABNORMAL HIGH (ref 65–99)
Potassium: 4.2 mmol/L (ref 3.5–5.2)
Sodium: 137 mmol/L (ref 134–144)

## 2020-07-18 NOTE — Progress Notes (Signed)
Primary Care Provider: Tammi Sou, MD Cardiologist: Glenetta Hew, MD Electrophysiologist: None  Clinic Note: Chief Complaint  Patient presents with   Follow-up   Medication Problem   HPI:    Thomas Spencer is a 54 y.o. male with a PMH below who presents today for close follow-up to discuss medication effect..  Problem List Items Addressed This Visit    CAD S/P PCI: p-mLAD Synergy DES 2.75 x 16 (3.1-2.9 mm), p-mRI Synergy DES 3.0 x 12 (3.3 mm) - Primary (Chronic)   Hyperlipidemia with target low density lipoprotein (LDL) cholesterol less than 70 mg/dL (Chronic)   Essential hypertension (Chronic)   Fluttering sensation of heart    Other Visit Diagnoses    Medication management       Relevant Orders   Basic metabolic panel (Completed)     Thomas Spencer was last seen on October 26 for follow-up that was delayed 56-month follow-up.  We reviewed the Myoview at that time showing that he had had a prior infarct which is more than just the angina symptom.  There was a fixed defect on Myoview.  We discussed his blood pressures being stage I hypertension still on Micardis and lipids with LDL going up to 90. ->  Plan was to increase rosuvastatin to 40 mg and start HCTZ.  Recent Hospitalizations: None  Reviewed  CV studies:    The following studies were reviewed today: (if available, images/films reviewed: From Epic Chart or Care Everywhere)  None:   Interval History:   Thomas Spencer return today to discuss medications.  He said that since I saw him, he was having some congestion and cough type symptoms and his PCP started him on prednisone.  Prescription since completed the prednisone course.  What he has noted though over the last week and a half is that he has been having strange popping palpitations skipping beats.  He is felt just strange with some fatigue and then cramping.  Several nondescript symptoms but he indicates that his mother had not done well with HCTZ in the  past of having electrolyte issues and palpitations. Echo he has not had any rapid heart rate symptoms or any lightheadedness or dizziness.  No syncope or near syncope.  No severe myalgias, just a general sense of being ill 80s. He also notes that his blood pressures have actually been higher, partially because he has been concerned.  CV Review of Symptoms (Summary): positive for - irregular heartbeat, palpitations, rapid heart rate and Chest discomfort not pain or angina, nausea cramping negative for - chest pain, dyspnea on exertion, orthopnea, paroxysmal nocturnal dyspnea, shortness of breath or Syncope/near syncope or TIA symptoms.  The patient does not have symptoms concerning for COVID-19 infection (fever, chills, cough, or new shortness of breath).   REVIEWED OF SYSTEMS   ROS pertinent symptoms noted in HPI.  Otherwise negative.  ---------------- I have reviewed and (if needed) personally updated the patient's problem list, medications, allergies, past medical and surgical history, social and family history.   PAST MEDICAL HISTORY   Past Medical History:  Diagnosis Date   Acute epiglottitis 01/2014   BPH with obstruction/lower urinary tract symptoms    [facility] referral done 01/[facility] (Dr. Risa Grill); ultimately a prostate biopsy was done for increased PSA velocity--April 2013--and it  was completely normal.   CAD S/P percutaneous coronary angioplasty 05/05/2017   Progressive Angina: (3 V CAD on Cor Calcium Score) --> LEFT HEART CATH & CORONARY ANGIOGRAPHY  / CORONARY STENT INTERVENTION Conclusion  LESION #1: p-mLAD to Mid LAD 80 %s --> PCI SYNERGY DES 2.75X16 - Tapered post-dilation: 3.1-2.9 mm.  LESION #2: pRI 85 % - PCI SYNERGY DES 3X12.  Initial plan ASA/Plavix x 1 yr  --> ASA stopped 06/2017 for GI Bleed-- on Plavix alone and continued w/this 11/2017   Elevated coronary artery calcium score 03/2017   indicating possible 3 vessel CAD: cardiac cath per Dr. Ellyn Hack 8/22  revealed 2 Vessel obstructive CAD in LAD & RI    Elevated liver function tests    mild   Epididymal cyst 09/2019   Right (vs spermatocele.  Hematocele less likely since no hx of trauma).   Erectile dysfunction    Family history of colon cancer    father   Family history of hypertension    strong family history   Family history of premature coronary heart disease    strong family history; Mother (MI in 18s, then 7s, s/p Valve Sgx in 33s) & oldest Brother (CABG x 4 in 57s)   History of adenomatous polyp of colon 2007; 2014; 08/2019   Dr. Henrene Pastor did 2020 scope.  Recall 2025.   History of sciatica 11/2013   responded well to prednisone burst 11/2013   Hyperlipidemia    On 40 mg Crestor as of 06/2017   Hypertension    On telmisartan   Iron deficiency anemia 06/2017   Hb drop from 15 down to 11 from 04/2017-06/2017.  +H pylori gastritis+.  Recurrence 01/2019 +correlation with melena and fatigue.  Pt has been back on iron x 2 wks and is making arrangements to see his GI MD as of 02/08/19 (? repeat EGD + is overdue for repeat colonoscopy).   Prediabetes 11/2016   HbA1c 6.1%     PAST SURGICAL HISTORY   Past Surgical History:  Procedure Laterality Date   CARDIOVASCULAR STRESS TEST  11/16/2019   Normal EF, no ischemia   COLONOSCOPY W/ POLYPECTOMY  09/02/06; 07/2013; 08/2019   2014 [facility]: adenomatous polyp w/out high grade dysplasia or malignancy.  08/2019 adenoma x 2, diverticulosis, recall 5 yrs.   coronary calcium score     Very high risk: as of 03/2017 pt is to return to discuss with Dr. Ellyn Hack: stress test vs cath as next step.   CORONARY STENT INTERVENTION N/A 05/05/2017   DES x 2. Preserved LV function.  Procedure: CORONARY STENT INTERVENTION;  Surgeon: Leonie Man, MD;  Location: [facility];  Service: Cardiovascular;: CAD S/P PCI: p-mLAD Synergy DES 2.75 x 16 (3.1-2.9 mm), p-mRI Synergy DES 3.0 x 12 (3.3 mm)   dental implants      ESOPHAGOGASTRODUODENOSCOPY  07/08/2017   GI RECOMMENDED D/C ASPIRIN:  superfic non-bleeding ulcers ranging 5-33mm in size in antrum.  +H pylori +.  A few non-bleeding erosions in pre-pyloric region.  Normal duodenum.   LEFT HEART CATH AND CORONARY ANGIOGRAPHY N/A 05/05/2017   Procedure: LEFT HEART CATH AND CORONARY ANGIOGRAPHY;  Surgeon: Leonie Man, MD;  Location: [facility];  Service: Cardiovascular;  Laterality: N/A; p-m LAD 80%, mRI 90% --> 2 vessel PCI   NM MYOVIEW LTD  11/16/2019   Fixed basal inferoseptal and mid anteroseptal perfusion defect consistent with subendocardial scar, NO ISCHEMIA.  EF 52%.  Unable to interpret EKG.  LOW Risk.   PROSTATE BIOPSY  01/2012   Normal   05/05/2017: Pre and Post-PCI Diagram: PCI LAD Synergy DES: 2.75x 16 (~3.2mm); PCI RI Synergy 3.0 x 12 (3.3 mm)  Immunization History  Administered Date(s) Administered   Influenza,inj,Quad PF,6+ Mos 07/03/2014, 07/01/2017, 07/20/2019   Influenza-Unspecified 06/28/2015   Janssen (J&J) SARS-COV-2 Vaccination 11/25/2019   Td 01/17/2008   Tdap 03/02/2020   Zoster Recombinat (Shingrix) 04/03/2020    MEDICATIONS/ALLERGIES   Current Meds  Medication Sig   albuterol (VENTOLIN HFA) 108 (90 Base) MCG/ACT inhaler Inhale 2 puffs into the lungs every 4 (four) hours as needed for wheezing or shortness of breath.   clopidogrel (PLAVIX) 75 MG tablet TAKE 1 TABLET (75 MG TOTAL) BY MOUTH DAILY WITH BREAKFAST.   hydrochlorothiazide (HYDRODIURIL) 25 MG tablet Take 1 tablet (25 mg total) by mouth daily.   Multiple Vitamin (MULTIVITAMIN WITH MINERALS) TABS tablet Take 1 tablet by mouth daily.   pantoprazole (PROTONIX) 40 MG tablet Take 40 mg by mouth daily. 1 tab daily   predniSONE (DELTASONE) 10 MG tablet 5 tabs po qd x 2 days, then 4 tabs po qd x 2d, then 3 tabs po qd x 2d, then 2 tabs po qd x 2d, then 1 tab po qd x 2d   rosuvastatin (CRESTOR) 40 MG tablet Take 1 tablet (40 mg total) by mouth  daily.   sildenafil (VIAGRA) 100 MG tablet Take 1 tablet (100 mg total) by mouth daily as needed for erectile dysfunction.   telmisartan (MICARDIS) 40 MG tablet TAKE 1 TABLET BY MOUTH EVERY DAY    Allergies  Allergen Reactions   Ace Inhibitors Cough   Shrimp [Shellfish Allergy] Other (See Comments)    Swelling around eyes/lips---can eat IF cooked WELL    SOCIAL HISTORY/FAMILY HISTORY   Reviewed in Epic:  Pertinent findings: N/A  OBJCTIVE -PE, EKG, labs   Wt Readings from Last 3 Encounters:  07/18/20 159 lb 9.6 oz (72.4 kg)  07/09/20 158 lb 12.8 oz (72 kg)  04/03/20 158 lb 9.6 oz (71.9 kg)    Physical Exam: BP 125/72    Pulse 90    Temp (!) 95.4 F (35.2 C)    Ht 5\' 4"  (1.626 m)    Wt 159 lb 9.6 oz (72.4 kg)    SpO2 98%    BMI 27.40 kg/m  Physical Exam Constitutional:      General: He is not in acute distress.    Appearance: Normal appearance. He is normal weight. He is not ill-appearing or toxic-appearing.  Pulmonary:     Effort: Pulmonary effort is normal.  Musculoskeletal:     Cervical back: Normal range of motion and neck supple.  Neurological:     General: No focal deficit present.     Mental Status: He is alert and oriented to person, place, and time.  Psychiatric:        Mood and Affect: Mood normal.        Behavior: Behavior normal.        Thought Content: Thought content normal.        Judgment: Judgment normal.     Adult ECG Report N/A   Recent Labs:    Lab Results  Component Value Date   CHOL 153 04/03/2020   HDL 35.90 (L) 04/03/2020   LDLCALC 90 04/03/2020   LDLDIRECT 72.0 12/08/2016   TRIG 135.0 04/03/2020   CHOLHDL 4 04/03/2020   -> Labs ordered for today Lab Results  Component Value Date   CREATININE 1.31 (H) 07/18/2020   BUN 22 07/18/2020   NA 137 07/18/2020   K 4.2 07/18/2020   CL 95 (L) 07/18/2020   CO2 30 (H) 07/18/2020  Lab Results  Component Value Date   TSH 1.20 04/03/2020    ASSESSMENT/PLAN    Problem List Items  Addressed This Visit    CAD S/P PCI: p-mLAD Synergy DES 2.75 x 16 (3.1-2.9 mm), p-mRI Synergy DES 3.0 x 12 (3.3 mm) - Primary (Chronic)    He is not having angina.  We are simply trying to adjust his lipids and blood pressure management.   Remains on Plavix, Micardis and at least 20 mg of statin.      Relevant Orders   Basic metabolic panel (Completed)   Hyperlipidemia with target low density lipoprotein (LDL) cholesterol less than 70 mg/dL (Chronic)    Or worsening lipids, I had increased him to 40 mg of rosuvastatin.  However he is now having some adverse reactions.  At this point, need to determine which of the new medications causes problems.  Stop rosuvastatin completely for 2 weeks and then restart at 40 mg without the HCTZ -> reassess symptoms after 2 weeks.  If his abnormal symptoms are no longer present, then continue 40 mg rosuvastatin.  If his symptoms are present stay with 20 mg and consider adding Nexlizet      Essential hypertension (Chronic)    He told his blood pressures at home are ranging anywhere from 113/66-143/89 mmHg.  These pressures are now bad.  Currently this is without him being on HCTZ as he has stopped it.  Plan: Continue to hold HCTZ but I would like to reintroduce to see if indeed he is having an adverse reaction to it.  Continue to follow blood pressure log  Check BMP today  2 weeks off of both HCTZ and rosuvastatin -> at that point he would restart 40 mg rosuvastatin and reassess in 2 weeks.    If no symptoms, then try starting HCTZ 25 mg and reassess after 2 weeks.  If he does have his symptoms with the higher dose of rosuvastatin, then reduce to 20 mg and reinitiate HCTZ after 2 weeks.      Presence of drug coated stent in LAD coronary artery (Chronic)    Two-vessel PCI with DES in LAD had RI.  On maintenance dose Plavix.  Okay to hold Plavix 5 days preop procedures or surgeries.      Fluttering sensation of heart    He is now having the  strange symptoms of fluttering that had gone away following PCI.  I suspect it may be potentially related to electrolyte abnormalities.  Reassess when he has been off medications for 2 weeks.      Relevant Orders   Basic metabolic panel (Completed)    Other Visit Diagnoses    Medication management       Relevant Orders   Basic metabolic panel (Completed)       COVID-19 Education: The signs and symptoms of COVID-19 were discussed with the patient and how to seek care for testing (follow up with PCP or arrange E-visit).   The importance of social distancing and COVID-19 vaccination was discussed today.  The patient is practicing social distancing & Masking.   I spent a total of 22 minutes with the patient spent in direct patient consultation.  Additional time spent with chart review  / charting (studies, outside notes, etc): 8 Total Time: 30 min   Current medicines are reviewed at length with the patient today.  (+/- concerns) discussed concerns about change in medications.  This visit occurred during the SARS-CoV-2 public health emergency.  Safety protocols were  in place, including screening questions prior to the visit, additional usage of staff PPE, and extensive cleaning of exam room while observing appropriate contact time as indicated for disinfecting solutions.  Notice: This dictation was prepared with Dragon dictation along with smaller phrase technology. Any transcriptional errors that result from this process are unintentional and may not be corrected upon review.  Patient Instructions / Medication Changes & Studies & Tests Ordered   Patient Instructions  Medication Instructions:  Take all other meds except these changes :    For 2 weeks do not take Rosuvastatin 40 mg or HCTZ 25 mg   ,  Then restart Rosuvastatin at 40 mg daily and continue to take  40 mg  Tablet for 2 weeks-- If you have symptoms reoccur  Then reduce to 20 mg of Rosuvastatin  Daily and wait 2 weeks to   Restart HCTZ Or If  Symptoms do not recoccur  start the  25 mg HCTZ take for one week if symptoms reoccur after the restart  Contact office and stop taking HCTZ   *If you need a refill on your cardiac medications before your next appointment, please call your pharmacy*   Lab Work:  BMP today  If you have labs (blood work) drawn today and your tests are completely normal, you will receive your results only by:  [place] (if you have MyChart) OR  A paper copy in the mail If you have any lab test that is abnormal or we need to change your treatment, we will call you to review the results.   Testing/Procedures: Not needed   Follow-Up: At [facility], you and your health needs are our priority.  As part of our continuing mission to provide you with exceptional heart care, we have created designated Provider Care Teams.  These Care Teams include your primary Cardiologist (physician) and Advanced Practice Providers (APPs -  Physician Assistants and Nurse Practitioners) who all work together to provide you with the care you need, when you need it.  .    Your next appointment:   5 month(s)  The format for your next appointment:   In Person  Provider:   Glenetta Hew, MD     Studies Ordered:   Orders Placed This Encounter  Procedures   Basic metabolic panel     Glenetta Hew, M.D., M.S. Interventional Cardiologist   Pager # [phone] Phone # [phone] [address] [place], [place] [address]   Thank you for choosing [facility] at [facility]!!

## 2020-07-18 NOTE — Patient Instructions (Addendum)
Medication Instructions:  Take all other meds except these changes :    For 2 weeks do not take Rosuvastatin 40 mg or HCTZ 25 mg   ,  Then restart Rosuvastatin at 40 mg daily and continue to take  40 mg  Tablet for 2 weeks-- If you have symptoms reoccur  Then reduce to 20 mg of Rosuvastatin  Daily and wait 2 weeks to  Restart HCTZ Or If  Symptoms do not recoccur  start the  25 mg HCTZ take for one week if symptoms reoccur after the restart  Contact office and stop taking HCTZ   *If you need a refill on your cardiac medications before your next appointment, please call your pharmacy*   Lab Work:  BMP today  If you have labs (blood work) drawn today and your tests are completely normal, you will receive your results only by: Marland Kitchen MyChart Message (if you have MyChart) OR . A paper copy in the mail If you have any lab test that is abnormal or we need to change your treatment, we will call you to review the results.   Testing/Procedures: Not needed   Follow-Up: At [facility], you and your health needs are our priority.  As part of our continuing mission to provide you with exceptional heart care, we have created designated Provider Care Teams.  These Care Teams include your primary Cardiologist (physician) and Advanced Practice Providers (APPs -  Physician Assistants and Nurse Practitioners) who all work together to provide you with the care you need, when you need it.  .    Your next appointment:   5 month(s)  The format for your next appointment:   In Person  Provider:   Glenetta Hew, MD

## 2020-07-19 ENCOUNTER — Ambulatory Visit: Payer: BC Managed Care – PPO

## 2020-07-19 ENCOUNTER — Encounter: Payer: Self-pay | Admitting: Cardiology

## 2020-07-19 DIAGNOSIS — I252 Old myocardial infarction: Secondary | ICD-10-CM | POA: Insufficient documentation

## 2020-07-19 NOTE — Assessment & Plan Note (Addendum)
He tells me his blood pressure at home is about the same range and is here.  Probably averaging but would now be considered stage I hypertension by the new guidelines.  I would like to see a little better blood pressure control.  We will try to start low-dose HCTZ along with his Micardis.  Avoiding beta-blocker because of resting bradycardia and fatigue.  He asked about potentially being referred for nutrition counseling.  We provided some information in the AVS.  We will see about referral.

## 2020-07-19 NOTE — Assessment & Plan Note (Signed)
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  Perhaps some of his progressive anginal symptoms were actually causing myocardial injury.  No residual ischemia noted in relatively small defect, but clearly there does appear to be myocardial infarction.  I went through this in detail with him and have explained to him the pathophysiology.  He was somewhat confused and taken back, but after we discussed today Concerta made sense based on the level symptoms he was having prior to PCI.

## 2020-07-19 NOTE — Assessment & Plan Note (Signed)
DES PCI to the LAD and ramus intermedius.  On maintenance dose Plavix which can be held for procedure 5 days preop

## 2020-07-19 NOTE — Assessment & Plan Note (Signed)
Or worsening lipids, I had increased him to 40 mg of rosuvastatin.  However he is now having some adverse reactions.  At this point, need to determine which of the new medications causes problems.  Stop rosuvastatin completely for 2 weeks and then restart at 40 mg without the HCTZ -> reassess symptoms after 2 weeks.  If his abnormal symptoms are no longer present, then continue 40 mg rosuvastatin.  If his symptoms are present stay with 20 mg and consider adding Nexlizet

## 2020-07-19 NOTE — Assessment & Plan Note (Signed)
His previous LDL was 39 on 20 Lipitor.  Unfortunately, as of July, his LDL is now up to 90.  Somewhat unusual, and may have been related to change in his diet.  Plan: We will try to increase to 40 mg rosuvastatin and reassess in roughly 3 months.  If that is tolerated we will probably consider Nexlizet (Nexletol plus ezetimibe)

## 2020-07-19 NOTE — Assessment & Plan Note (Signed)
He told his blood pressures at home are ranging anywhere from 113/66-143/89 mmHg.  These pressures are now bad.  Currently this is without him being on HCTZ as he has stopped it.  Plan: Continue to hold HCTZ but I would like to reintroduce to see if indeed he is having an adverse reaction to it.  Continue to follow blood pressure log  Check BMP today  2 weeks off of both HCTZ and rosuvastatin -> at that point he would restart 40 mg rosuvastatin and reassess in 2 weeks.    If no symptoms, then try starting HCTZ 25 mg and reassess after 2 weeks.  If he does have his symptoms with the higher dose of rosuvastatin, then reduce to 20 mg and reinitiate HCTZ after 2 weeks.

## 2020-07-19 NOTE — Assessment & Plan Note (Signed)
3 years out from two-vessel PCI I for progressive angina symptoms.  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  He is active with no labs drawn anginal symptoms now.  No more than chest pain that he had back in February.  Plan:   Continue rosuvastatin, but would like to increase dose to 40 mg  Continue Micardis  Continue maintenance Plavix for two-vessel PCI.  Okay to hold Plavix 5 days preop for procedures or surgeries (other than cardiac cath).

## 2020-07-20 NOTE — Assessment & Plan Note (Signed)
He is now having the strange symptoms of fluttering that had gone away following PCI.  I suspect it may be potentially related to electrolyte abnormalities.  Reassess when he has been off medications for 2 weeks.

## 2020-07-20 NOTE — Assessment & Plan Note (Signed)
He is not having angina.  We are simply trying to adjust his lipids and blood pressure management.   Remains on Plavix, Micardis and at least 20 mg of statin.

## 2020-07-20 NOTE — Assessment & Plan Note (Signed)
Two-vessel PCI with DES in LAD had RI.  On maintenance dose Plavix.  Okay to hold Plavix 5 days preop procedures or surgeries.

## 2020-07-26 ENCOUNTER — Ambulatory Visit: Payer: BC Managed Care – PPO

## 2020-08-23 ENCOUNTER — Ambulatory Visit (INDEPENDENT_AMBULATORY_CARE_PROVIDER_SITE_OTHER): Payer: BC Managed Care – PPO

## 2020-08-23 ENCOUNTER — Other Ambulatory Visit: Payer: Self-pay

## 2020-08-23 DIAGNOSIS — Z23 Encounter for immunization: Secondary | ICD-10-CM

## 2020-09-29 ENCOUNTER — Other Ambulatory Visit: Payer: Self-pay | Admitting: Family Medicine

## 2020-10-07 ENCOUNTER — Encounter: Payer: Self-pay | Admitting: Cardiology

## 2020-10-07 ENCOUNTER — Other Ambulatory Visit: Payer: Self-pay

## 2020-10-07 ENCOUNTER — Ambulatory Visit: Payer: BC Managed Care – PPO | Admitting: Cardiology

## 2020-10-07 VITALS — BP 132/74 | HR 91 | Ht 64.0 in | Wt 166.4 lb

## 2020-10-07 DIAGNOSIS — I10 Essential (primary) hypertension: Secondary | ICD-10-CM | POA: Diagnosis not present

## 2020-10-07 DIAGNOSIS — R002 Palpitations: Secondary | ICD-10-CM

## 2020-10-07 DIAGNOSIS — Z955 Presence of coronary angioplasty implant and graft: Secondary | ICD-10-CM | POA: Diagnosis not present

## 2020-10-07 DIAGNOSIS — I2510 Atherosclerotic heart disease of native coronary artery without angina pectoris: Secondary | ICD-10-CM | POA: Diagnosis not present

## 2020-10-07 DIAGNOSIS — Z9861 Coronary angioplasty status: Secondary | ICD-10-CM

## 2020-10-07 DIAGNOSIS — Z8679 Personal history of other diseases of the circulatory system: Secondary | ICD-10-CM

## 2020-10-07 DIAGNOSIS — E785 Hyperlipidemia, unspecified: Secondary | ICD-10-CM

## 2020-10-07 NOTE — Progress Notes (Signed)
Primary Care Provider: Tammi Sou, MD Cardiologist: Glenetta Hew, MD Electrophysiologist: None  Clinic Note: Chief Complaint  Patient presents with   Follow-up    Discussed medications   HPI:    Thomas Spencer is a 55 y.o. male with a PMH below who presents today for close follow-up to discuss medication effect..  Problem List Items Addressed This Visit    CAD S/P PCI: p-mLAD Synergy DES 2.75 x 16 (3.1-2.9 mm), p-mRI Synergy DES 3.0 x 12 (3.3 mm) - Primary (Chronic)   Hyperlipidemia with target low density lipoprotein (LDL) cholesterol less than 70 mg/dL (Chronic)   Essential hypertension (Chronic)   Fluttering sensation of heart    Other Visit Diagnoses    Medication management       Relevant Orders   Basic metabolic panel (Completed)     Thomas Spencer was last seen 07/18/2020   For 2 weeks do not take Rosuvastatin 40 mg or HCTZ 25 mg   ,  Then restart Rosuvastatin at 40 mg daily and continue to take  40 mg  Tablet for 2 weeks-- If you have symptoms reoccur  Then reduce to 20 mg of Rosuvastatin  Daily and wait 2 weeks to  Restart HCTZ Or If  Symptoms do not recoccur  start the  25 mg HCTZ take for one week if symptoms reoccur after the restart  Contact office and stop taking HCTZ  Recent Hospitalizations: None  Reviewed  CV studies:    The following studies were reviewed today: (if available, images/films reviewed: From Epic Chart or Care Everywhere)  None:   Interval History:   Thomas Spencer return today to discuss medications.   What he found out over the last month is that he was able to do fine with restarting rosuvastatin and is now taking 40 mg.  He however was not able to tolerate HCTZ stating that he felt fatigued heavy headed, dizzy and constipated.  He really did not have the palpitations skipped beats as much it was more the fatigue and cramping.  Despite all this, he actually has been feeling better.  His blood pressures have been doing well  and he is not really having any headaches or blurred vision.  Palpitations have improved with a rapid heart rates.  No chest pain or pressure.  No longer having nausea symptoms.  No CHF symptoms.  CV Review of Symptoms (Summary): no chest pain or dyspnea on exertion positive for - Still has occasional skipped beats but very rare. negative for - chest pain, dyspnea on exertion, edema, orthopnea, paroxysmal nocturnal dyspnea, rapid heart rate, shortness of breath or Syncope/near syncope or TIA symptoms.  The patient DOES NOT have symptoms concerning for COVID-19 infection (fever, chills, cough, or new shortness of breath).   REVIEWED OF SYSTEMS   Review of Systems  Constitutional: Negative for malaise/fatigue.  HENT: Negative for congestion and nosebleeds.   Respiratory: Negative for cough and shortness of breath.   Gastrointestinal: Negative for blood in stool, melena and nausea.  Genitourinary: Negative for hematuria.  Musculoskeletal: Negative for falls and joint pain.  Neurological: Negative for dizziness and weakness.  Psychiatric/Behavioral: Negative for depression and memory loss. The patient is nervous/anxious (Still there but much better.  Controlled.). The patient does not have insomnia.    pertinent symptoms noted in HPI.  Otherwise negative.  ---------------- I have reviewed and (if needed) personally updated the patient's problem list, medications, allergies, past medical and surgical history, social and family history.  PAST MEDICAL HISTORY   Past Medical History:  Diagnosis Date   Acute epiglottitis 01/2014   BPH with obstruction/lower urinary tract symptoms    [facility] referral done 01/2011 (Dr. Risa Grill); ultimately a prostate biopsy was done for increased PSA velocity--April 2013--and it  was completely normal.   CAD S/P percutaneous coronary angioplasty 05/05/2017   Progressive Angina: (3 V CAD on Cor Calcium Score) --> LEFT HEART CATH & CORONARY ANGIOGRAPHY   / CORONARY STENT INTERVENTION Conclusion  LESION #1: p-mLAD to Mid LAD 80 %s --> PCI SYNERGY DES 2.75X16 - Tapered post-dilation: 3.1-2.9 mm.  LESION #2: pRI 85 % - PCI SYNERGY DES 3X12.  Initial plan ASA/Plavix x 1 yr  --> ASA stopped 06/2017 for GI Bleed-- on Plavix alone and continued w/this 11/2017   Elevated coronary artery calcium score 03/2017   indicating possible 3 vessel CAD: cardiac cath per Dr. Ellyn Hack 8/22 revealed 2 Vessel obstructive CAD in LAD & RI    Elevated liver function tests    mild   Epididymal cyst 09/2019   Right (vs spermatocele.  Hematocele less likely since no hx of trauma).   Erectile dysfunction    Family history of colon cancer    father   Family history of hypertension    strong family history   Family history of premature coronary heart disease    strong family history; Mother (MI in 59s, then 50s, s/p Valve Sgx in 57s) & oldest Brother (CABG x 4 in 63s)   History of adenomatous polyp of colon 2007; 2014; 08/2019   Dr. Henrene Pastor did 2020 scope.  Recall 2025.   History of sciatica 11/2013   responded well to prednisone burst 11/2013   Hyperlipidemia    On 40 mg Crestor as of 06/2017   Hypertension    On telmisartan   Iron deficiency anemia 06/2017   Hb drop from 15 down to 11 from 04/2017-06/2017.  +H pylori gastritis+.  Recurrence 01/2019 +correlation with melena and fatigue.  Pt has been back on iron x 2 wks and is making arrangements to see his GI MD as of 02/08/19 (? repeat EGD + is overdue for repeat colonoscopy).   Prediabetes 11/2016   HbA1c 6.1%     PAST SURGICAL HISTORY   Past Surgical History:  Procedure Laterality Date   CARDIOVASCULAR STRESS TEST  11/16/2019   Normal EF, no ischemia   COLONOSCOPY W/ POLYPECTOMY  09/02/06; 07/2013; 08/2019   2014 [facility]: adenomatous polyp w/out high grade dysplasia or malignancy.  08/2019 adenoma x 2, diverticulosis, recall 5 yrs.   coronary calcium score     Very high risk: as of 03/2017 pt is  to return to discuss with Dr. Ellyn Hack: stress test vs cath as next step.   CORONARY STENT INTERVENTION N/A 05/05/2017   DES x 2. Preserved LV function.  Procedure: CORONARY STENT INTERVENTION;  Surgeon: Leonie Man, MD;  Location: [facility];  Service: Cardiovascular;: CAD S/P PCI: p-mLAD Synergy DES 2.75 x 16 (3.1-2.9 mm), p-mRI Synergy DES 3.0 x 12 (3.3 mm)   dental implants     ESOPHAGOGASTRODUODENOSCOPY  07/08/2017   GI RECOMMENDED D/C ASPIRIN:  superfic non-bleeding ulcers ranging 5-32mm in size in antrum.  +H pylori +.  A few non-bleeding erosions in pre-pyloric region.  Normal duodenum.   LEFT HEART CATH AND CORONARY ANGIOGRAPHY N/A 05/05/2017   Procedure: LEFT HEART CATH AND CORONARY ANGIOGRAPHY;  Surgeon: Leonie Man, MD;  Location: [facility];  Service: Cardiovascular;  Laterality: N/A; p-m LAD 80%, mRI 90% --> 2 vessel PCI   NM MYOVIEW LTD  11/16/2019   Fixed basal inferoseptal and mid anteroseptal perfusion defect consistent with subendocardial scar, NO ISCHEMIA.  EF 52%.  Unable to interpret EKG.  LOW Risk.   PROSTATE BIOPSY  01/2012   Normal   05/05/2017: Pre and Post-PCI Diagram: PCI LAD Synergy DES: 2.75x 16 (~3.58mm); PCI RI Synergy 3.0 x 12 (3.3 mm)    Immunization History  Administered Date(s) Administered   Influenza,inj,Quad PF,6+ Mos 07/03/2014, 07/01/2017, 07/20/2019, 07/26/2020   Influenza-Unspecified 06/28/2015   Janssen (J&J) SARS-COV-2 Vaccination 11/25/2019   PFIZER(Purple Top)SARS-COV-2 Vaccination 07/26/2020   Td 01/17/2008   Tdap 03/02/2020   Zoster Recombinat (Shingrix) 04/03/2020, 08/23/2020    MEDICATIONS/ALLERGIES   Current Meds  Medication Sig   albuterol (VENTOLIN HFA) 108 (90 Base) MCG/ACT inhaler Inhale 2 puffs into the lungs every 4 (four) hours as needed for wheezing or shortness of breath.   clopidogrel (PLAVIX) 75 MG tablet TAKE 1 TABLET (75 MG TOTAL) BY MOUTH DAILY WITH BREAKFAST.   Multiple  Vitamin (MULTIVITAMIN WITH MINERALS) TABS tablet Take 1 tablet by mouth daily.   pantoprazole (PROTONIX) 40 MG tablet Take 40 mg by mouth daily. 1 tab daily   predniSONE (DELTASONE) 10 MG tablet 5 tabs po qd x 2 days, then 4 tabs po qd x 2d, then 3 tabs po qd x 2d, then 2 tabs po qd x 2d, then 1 tab po qd x 2d   rosuvastatin (CRESTOR) 40 MG tablet Take 1 tablet (40 mg total) by mouth daily.   sildenafil (VIAGRA) 100 MG tablet Take 1 tablet (100 mg total) by mouth daily as needed for erectile dysfunction.   telmisartan (MICARDIS) 40 MG tablet TAKE 1 TABLET BY MOUTH EVERY DAY   [DISCONTINUED] hydrochlorothiazide (HYDRODIURIL) 25 MG tablet Take 1 tablet (25 mg total) by mouth daily.    Allergies  Allergen Reactions   Ace Inhibitors Cough   Shrimp [Shellfish Allergy] Other (See Comments)    Swelling around eyes/lips---can eat IF cooked WELL   Thiazide-Type Diuretics     FATIGUE; DIZZINESS.      SOCIAL HISTORY/FAMILY HISTORY   Reviewed in Epic:  Pertinent findings: N/A  OBJCTIVE -PE, EKG, labs   Wt Readings from Last 3 Encounters:  10/07/20 166 lb 6.4 oz (75.5 kg)  07/18/20 159 lb 9.6 oz (72.4 kg)  07/09/20 158 lb 12.8 oz (72 kg)    Physical Exam: BP 132/74    Pulse 91    Ht 5\' 4"  (1.626 m)    Wt 166 lb 6.4 oz (75.5 kg)    SpO2 97%    BMI 28.56 kg/m  Physical Exam Constitutional:      General: He is not in acute distress.    Appearance: Normal appearance. He is normal weight. He is not ill-appearing or toxic-appearing.  HENT:     Head: Normocephalic and atraumatic.  Pulmonary:     Effort: Pulmonary effort is normal.  Musculoskeletal:        General: No swelling. Normal range of motion.     Cervical back: Normal range of motion and neck supple.  Skin:    General: Skin is warm.  Neurological:     General: No focal deficit present.     Mental Status: He is alert and oriented to person, place, and time.  Psychiatric:        Mood and Affect: Mood normal.  Behavior: Behavior normal.        Thought Content: Thought content normal.        Judgment: Judgment normal.     Adult ECG Report  Rate: 91 ;  Rhythm: normal sinus rhythm; mild ST elevation in inferior and lateral leads consistent with repolarization abnormality.  Narrative Interpretation: Borderline EKG.   Recent Labs:   Reviewed, no new labs Lab Results  Component Value Date   CHOL 153 04/03/2020   HDL 35.90 (L) 04/03/2020   LDLCALC 90 04/03/2020   LDLDIRECT 72.0 12/08/2016   TRIG 135.0 04/03/2020   CHOLHDL 4 04/03/2020   -> Labs ordered for today Lab Results  Component Value Date   CREATININE 1.31 (H) 07/18/2020   BUN 22 07/18/2020   NA 137 07/18/2020   K 4.2 07/18/2020   CL 95 (L) 07/18/2020   CO2 30 (H) 07/18/2020   Lab Results  Component Value Date   TSH 1.20 04/03/2020    ASSESSMENT/PLAN    Problem List Items Addressed This Visit    History of angina, class III (Chronic)    His initial presenting symptom was class III angina back in 2018.  He is now 3-1/2 years out from two-vessel PCI.  He has had intermittent chest discomfort symptoms but not anginal in nature. He had a low risk nonischemic Myoview in March 21 which did show that he potentially had an apical infarct either as part of the initial presentation or with stent placement. EF still preserved and no ocular symptoms.  No real anginal symptoms since PCI. Did not tolerate beta-blocker, or calcium channel blocker.  No longer requiring any nitrate.      CAD S/P PCI: p-mLAD Synergy DES 2.75 x 16 (3.1-2.9 mm), p-mRI Synergy DES 3.0 x 12 (3.3 mm) - Primary (Chronic)    No further angina.  Recent negative stress test with evidence of small apical infarct.  Plan: Continue with minimal medications which includes maintenance dose Plavix, myocarditis and increased dose of rosuvastatin to 40 mg.  Unable to tolerate beta-blockers, calcium channel blockers or ACE inhibitor.      Relevant Orders   EKG  12-Lead (Completed)   Lipid panel   Comprehensive metabolic panel   Hyperlipidemia with target low density lipoprotein (LDL) cholesterol less than 70 mg/dL (Chronic)    He is due for fasting the panel in March of this year.  Remains on rosuvastatin 40 mg.  He tolerated it fine.  It seems if the symptoms were related to HCTZ and not rosuvastatin.      Relevant Orders   Lipid panel   Comprehensive metabolic panel   Essential hypertension (Chronic)    Thankfully, his blood pressure now seems to be stable at this current reading seems to be the upper limit of what he usually has at home.  As such we probably do not need the HCTZ.  He can tolerate it  Plan: Continue with Micardis, he will monitor blood pressures intermittently.       Presence of drug coated stent in LAD coronary artery (Chronic)    Status post two-vessel PCI of the LAD and ramus intermedius.  3/2 years out.  Is on maintenance dose Plavix with no bleeding issues.   Okay to hold Plavix 5 days preop for surgeries or procedures.      Relevant Orders   EKG 12-Lead (Completed)   Lipid panel   Comprehensive metabolic panel   Fluttering sensation of heart    This seems to have abated.  COVID-19 Education: The signs and symptoms of COVID-19 were discussed with the patient and how to seek care for testing (follow up with PCP or arrange E-visit).   The importance of social distancing and COVID-19 vaccination was discussed today.  The patient is practicing social distancing & Masking.   I spent a total of 24 minutes with the patient spent in direct patient consultation.  Additional time spent with chart review  / charting (studies, outside notes, etc): 10 Total Time: 34 min   Current medicines are reviewed at length with the patient today.  (+/- concerns) discussed concerns about change in medications.  This visit occurred during the SARS-CoV-2 public health emergency.  Safety protocols were in place, including  screening questions prior to the visit, additional usage of staff PPE, and extensive cleaning of exam room while observing appropriate contact time as indicated for disinfecting solutions.  Notice: This dictation was prepared with Dragon dictation along with smaller phrase technology. Any transcriptional errors that result from this process are unintentional and may not be corrected upon review.  Patient Instructions / Medication Changes & Studies & Tests Ordered   Patient Instructions  Medication Instructions:   CONTINUE WITH CURRENT MEDICATIONS  *If you need a refill on your cardiac medications before your next appointment, please call your pharmacy*   Lab Work: CMP Lipid fasting - March 2022  If you have labs (blood work) drawn today and your tests are completely normal, you will receive your results only by:  [place] (if you have MyChart) OR  A paper copy in the mail If you have any lab test that is abnormal or we need to change your treatment, we will call you to review the results.   Testing/Procedures: Not needed   Follow-Up: At [facility], you and your health needs are our priority.  As part of our continuing mission to provide you with exceptional heart care, we have created designated Provider Care Teams.  These Care Teams include your primary Cardiologist (physician) and Advanced Practice Providers (APPs -  Physician Assistants and Nurse Practitioners) who all work together to provide you with the care you need, when you need it.     Your next appointment:   7 month(s)  The format for your next appointment:   In Person  Provider:   Glenetta Hew, MD   Other Instructions  Keep  Eye on Blood pressures     Studies Ordered:   Orders Placed This Encounter  Procedures   Lipid panel   Comprehensive metabolic panel   EKG [id number]     Glenetta Hew, M.D., M.S. Interventional Cardiologist   Pager # [phone] Phone # [phone] [address] [place], [place] [address]   Thank you for choosing Heartcare at [facility]!!

## 2020-10-07 NOTE — Patient Instructions (Addendum)
Medication Instructions:   CONTINUE WITH CURRENT MEDICATIONS  *If you need a refill on your cardiac medications before your next appointment, please call your pharmacy*   Lab Work: CMP Lipid fasting - March 2022  If you have labs (blood work) drawn today and your tests are completely normal, you will receive your results only by: Marland Kitchen MyChart Message (if you have MyChart) OR . A paper copy in the mail If you have any lab test that is abnormal or we need to change your treatment, we will call you to review the results.   Testing/Procedures: Not needed   Follow-Up: At [facility], you and your health needs are our priority.  As part of our continuing mission to provide you with exceptional heart care, we have created designated Provider Care Teams.  These Care Teams include your primary Cardiologist (physician) and Advanced Practice Providers (APPs -  Physician Assistants and Nurse Practitioners) who all work together to provide you with the care you need, when you need it.     Your next appointment:   7 month(s)  The format for your next appointment:   In Person  Provider:   Glenetta Hew, MD   Other Instructions  Keep  Eye on Blood pressures

## 2020-10-10 ENCOUNTER — Encounter: Payer: Self-pay | Admitting: Cardiology

## 2020-10-10 NOTE — Assessment & Plan Note (Signed)
This seems to have abated.

## 2020-10-10 NOTE — Assessment & Plan Note (Signed)
His initial presenting symptom was class III angina back in 2018.  He is now 3-1/2 years out from two-vessel PCI.  He has had intermittent chest discomfort symptoms but not anginal in nature. He had a low risk nonischemic Myoview in March 21 which did show that he potentially had an apical infarct either as part of the initial presentation or with stent placement. EF still preserved and no ocular symptoms.  No real anginal symptoms since PCI. Did not tolerate beta-blocker, or calcium channel blocker.  No longer requiring any nitrate.

## 2020-10-10 NOTE — Assessment & Plan Note (Signed)
No further angina.  Recent negative stress test with evidence of small apical infarct.  Plan: Continue with minimal medications which includes maintenance dose Plavix, myocarditis and increased dose of rosuvastatin to 40 mg.  Unable to tolerate beta-blockers, calcium channel blockers or ACE inhibitor.

## 2020-10-10 NOTE — Assessment & Plan Note (Signed)
He is due for fasting the panel in March of this year.  Remains on rosuvastatin 40 mg.  He tolerated it fine.  It seems if the symptoms were related to HCTZ and not rosuvastatin.

## 2020-10-10 NOTE — Assessment & Plan Note (Signed)
Thankfully, his blood pressure now seems to be stable at this current reading seems to be the upper limit of what he usually has at home.  As such we probably do not need the HCTZ.  He can tolerate it  Plan: Continue with Micardis, he will monitor blood pressures intermittently.

## 2020-10-10 NOTE — Assessment & Plan Note (Signed)
Status post two-vessel PCI of the LAD and ramus intermedius.  3/2 years out.  Is on maintenance dose Plavix with no bleeding issues.   Okay to hold Plavix 5 days preop for surgeries or procedures.

## 2020-12-10 ENCOUNTER — Other Ambulatory Visit: Payer: Self-pay | Admitting: Family Medicine

## 2020-12-26 ENCOUNTER — Telehealth: Payer: Self-pay

## 2020-12-26 NOTE — Telephone Encounter (Signed)
Spoke with patient regarding results/recommendations.  

## 2020-12-26 NOTE — Telephone Encounter (Signed)
Patient refill request.   He has a few other questions regarding 2 other meds  plavix and crestor.  He only received a 2 week supply and wants to know why.  Please call [phone].  sildenafil (VIAGRA) 100 MG tablet [[id number]]    [facility] [place]

## 2020-12-26 NOTE — Telephone Encounter (Signed)
LVM for pt to call back  Pt needs to have OV for further refills. Pt overdue for f/u appt

## 2021-01-03 ENCOUNTER — Ambulatory Visit: Payer: BC Managed Care – PPO | Admitting: Family Medicine

## 2021-01-16 ENCOUNTER — Encounter: Payer: Self-pay | Admitting: Family Medicine

## 2021-01-16 ENCOUNTER — Ambulatory Visit: Payer: BC Managed Care – PPO | Admitting: Family Medicine

## 2021-01-16 ENCOUNTER — Other Ambulatory Visit: Payer: Self-pay

## 2021-01-16 VITALS — BP 130/83 | HR 64 | Temp 98.0°F | Resp 16 | Ht 64.0 in | Wt 162.2 lb

## 2021-01-16 DIAGNOSIS — I10 Essential (primary) hypertension: Secondary | ICD-10-CM | POA: Diagnosis not present

## 2021-01-16 DIAGNOSIS — R7303 Prediabetes: Secondary | ICD-10-CM

## 2021-01-16 DIAGNOSIS — D689 Coagulation defect, unspecified: Secondary | ICD-10-CM | POA: Insufficient documentation

## 2021-01-16 DIAGNOSIS — I209 Angina pectoris, unspecified: Secondary | ICD-10-CM | POA: Insufficient documentation

## 2021-01-16 DIAGNOSIS — E7800 Pure hypercholesterolemia, unspecified: Secondary | ICD-10-CM

## 2021-01-16 LAB — LIPID PANEL
Cholesterol: 131 mg/dL (ref 0–200)
HDL: 30.7 mg/dL — ABNORMAL LOW (ref >39.00)
LDL Cholesterol: 66 mg/dL (ref 0–99)
NonHDL: 100.19
Total CHOL/HDL Ratio: 4
Triglycerides: 169 mg/dL — ABNORMAL HIGH (ref 0.0–149.0)
VLDL: 33.8 mg/dL (ref 0.0–40.0)

## 2021-01-16 LAB — COMPREHENSIVE METABOLIC PANEL
ALT: 48 U/L (ref 0–53)
AST: 34 U/L (ref 0–37)
Albumin: 4.5 g/dL (ref 3.5–5.2)
Alkaline Phosphatase: 51 U/L (ref 39–117)
BUN: 22 mg/dL (ref 6–23)
CO2: 28 mEq/L (ref 19–32)
Calcium: 9.5 mg/dL (ref 8.4–10.5)
Chloride: 102 mEq/L (ref 96–112)
Creatinine, Ser: 1.44 mg/dL (ref 0.40–1.50)
GFR: 54.11 mL/min — ABNORMAL LOW (ref >60.00)
Glucose, Bld: 87 mg/dL (ref 70–99)
Potassium: 4.9 mEq/L (ref 3.5–5.1)
Sodium: 138 mEq/L (ref 135–145)
Total Bilirubin: 0.7 mg/dL (ref 0.2–1.2)
Total Protein: 6.9 g/dL (ref 6.0–8.3)

## 2021-01-16 LAB — HEMOGLOBIN A1C: Hgb A1c MFr Bld: 6.2 % (ref 4.6–6.5)

## 2021-01-16 MED ORDER — TELMISARTAN 40 MG PO TABS: 40.0000 mg | ORAL_TABLET | Freq: Every day | ORAL | 3 refills | Status: DC

## 2021-01-16 MED ORDER — SILDENAFIL CITRATE 100 MG PO TABS: 100.0000 mg | ORAL_TABLET | Freq: Every day | ORAL | 5 refills | Status: DC | PRN

## 2021-01-16 MED ORDER — FLUTICASONE PROPIONATE 0.05 % EX CREA: TOPICAL_CREAM | CUTANEOUS | 1 refills | Status: DC

## 2021-01-16 NOTE — Progress Notes (Signed)
OFFICE VISIT  01/16/2021  CC:  Chief Complaint  Patient presents with  . Follow-up    RCI; HTN. He is fasting   HPI:    Patient is a 55 y.o. African-American male who presents for f/u HLD, HTN, prediabetes. A/P as of last visit (telemed visit, acute, 07/11/20): "URI with cough, lingering bronchospasm cough. Discussed likely component of GER (due to wide fluctuations in intragastric pressure when he is coughing) contributing to his throat phlegm, tendency to trigger more coughing, etc. Plan: prednisone 50-40-30-20-10 over the next 10d. Albuterol hfa 2p q4h prn. Inc protonix to 40mg  BID short term.  Of note, his cardiologist did increase his crestor to 40mg  daily based on his labs at our office about 3 mo ago.  Cardiologist has plan for f/u of pt's lipids and liver/kidney panels. "  INTERIM HX: Feeling well! Son graduating HS this year and daughter finished 2nd year at [employer] state.  HTN: home bps weekly-->130-140/upper 80s.  Off hctz lately b/c of some ?intolerance of this med. He worked through this hctz intol issue with Dr. Ellyn Hack.    HLD: tolerating crestor at the 40mg  qd dose since 06/2020. He is following up approp with Dr. Ellyn Hack in cardiology.  Prediabetes: trying to continue to work on lowering carb and fat intake. He walks regularly for exercise, [facility] [employer] since cov[facility] has calmed down.  Pt describes having occasional small patch of mildly reddish itchy skin, sometimes on top of hand/wrist, sometimes on face where his mask rubs the most. His daughter has same thing and has been rx'd a cream for it and pt states he applied her cream to his lesions and it helped.  He currently has no lesions for me to see.  ROS as above, plus--> no fevers, no CP, no SOB, no wheezing, no cough, no dizziness, no HAs, no melena/hematochezia.  No polyuria or polydipsia.  No myalgias or arthralgias.  No focal weakness, paresthesias, or tremors.  No acute vision or  hearing abnormalities.  No dysuria or unusual/new urinary urgency or frequency.  No recent changes in lower legs. No n/v/d or abd pain.  No palpitations.    Past Medical History:  Diagnosis Date  . Acute epiglottitis 01/2014  . BPH with obstruction/lower urinary tract symptoms    [facility] referral done 01/2011 (Dr. Risa Grill); ultimately a prostate biopsy was done for increased PSA velocity--April 2013--and it  was completely normal.  . CAD S/P percutaneous coronary angioplasty 05/05/2017   Progressive Angina: (3 V CAD on Cor Calcium Score) --> LEFT HEART CATH & CORONARY ANGIOGRAPHY  / CORONARY STENT INTERVENTION Conclusion  LESION #1: p-mLAD to Mid LAD 80 %s --> PCI SYNERGY DES 2.75X16 - Tapered post-dilation: 3.1-2.9 mm.  LESION #2: pRI 85 % - PCI SYNERGY DES 3X12.  Initial plan ASA/Plavix x 1 yr  --> ASA stopped 06/2017 for GI Bleed-- on Plavix alone and continued w/this 11/2017  . Elevated coronary artery calcium score 03/2017   indicating possible 3 vessel CAD: cardiac cath per Dr. Ellyn Hack 8/22 revealed 2 Vessel obstructive CAD in LAD & RI   . Elevated liver function tests    mild  . Epididymal cyst 09/2019   Right (vs spermatocele.  Hematocele less likely since no hx of trauma).  . Erectile dysfunction   . Family history of colon cancer    father  . Family history of hypertension    strong family history  . Family history of premature coronary heart disease  strong family history; Mother (MI in 30s, then 79s, s/p Valve Sgx in 62s) & oldest Brother (CABG x 4 in 62s)  . History of adenomatous polyp of colon 2007; 2014; 08/2019   Dr. Henrene Pastor did 2020 scope.  Recall 2025.  Marland Kitchen History of sciatica 11/2013   responded well to prednisone burst 11/2013  . Hyperlipidemia    On 40 mg Crestor as of 06/2017  . Hypertension    On telmisartan  . Iron deficiency anemia 06/2017   Hb drop from 15 down to 11 from 04/2017-06/2017.  +H pylori gastritis+.  Recurrence 01/2019 +correlation with melena  and fatigue.  Pt has been back on iron x 2 wks and is making arrangements to see his GI MD as of 02/08/19 (? repeat EGD + is overdue for repeat colonoscopy).  . Prediabetes 11/2016   HbA1c 6.1%     Past Surgical History:  Procedure Laterality Date  . CARDIOVASCULAR STRESS TEST  11/16/2019   Normal EF, no ischemia  . COLONOSCOPY W/ POLYPECTOMY  09/02/06; 07/2013; 08/2019   2014 [facility]: adenomatous polyp w/out high grade dysplasia or malignancy.  08/2019 adenoma x 2, diverticulosis, recall 5 yrs.  . coronary calcium score     Very high risk: as of 03/2017 pt is to return to discuss with Dr. Ellyn Hack: stress test vs cath as next step.  . CORONARY STENT INTERVENTION N/A 05/05/2017   DES x 2. Preserved LV function.  Procedure: CORONARY STENT INTERVENTION;  Surgeon: Leonie Man, MD;  Location: [facility];  Service: Cardiovascular;: CAD S/P PCI: p-mLAD Synergy DES 2.75 x 16 (3.1-2.9 mm), p-mRI Synergy DES 3.0 x 12 (3.3 mm)  . dental implants    . ESOPHAGOGASTRODUODENOSCOPY  07/08/2017   GI RECOMMENDED D/C ASPIRIN:  superfic non-bleeding ulcers ranging 5-68mm in size in antrum.  +H pylori +.  A few non-bleeding erosions in pre-pyloric region.  Normal duodenum.  Marland Kitchen LEFT HEART CATH AND CORONARY ANGIOGRAPHY N/A 05/05/2017   Procedure: LEFT HEART CATH AND CORONARY ANGIOGRAPHY;  Surgeon: Leonie Man, MD;  Location: [facility];  Service: Cardiovascular;  Laterality: N/A; p-m LAD 80%, mRI 90% --> 2 vessel PCI  . NM MYOVIEW LTD  11/16/2019   Fixed basal inferoseptal and mid anteroseptal perfusion defect consistent with subendocardial scar, NO ISCHEMIA.  EF 52%.  Unable to interpret EKG.  LOW Risk.  Marland Kitchen PROSTATE BIOPSY  01/2012   Normal    Outpatient Medications Prior to Visit  Medication Sig Dispense Refill  . clopidogrel (PLAVIX) 75 MG tablet TAKE 1 TABLET BY MOUTH DAILY WITH BREAKFAST. 14 tablet 0  . Multiple Vitamin (MULTIVITAMIN WITH MINERALS) TABS tablet Take 1 tablet by mouth  daily.    . pantoprazole (PROTONIX) 40 MG tablet Take 40 mg by mouth daily. 1 tab daily    . sildenafil (VIAGRA) 100 MG tablet Take 1 tablet (100 mg total) by mouth daily as needed for erectile dysfunction. 6 tablet 2  . telmisartan (MICARDIS) 40 MG tablet TAKE 1 TABLET BY MOUTH EVERY DAY 14 tablet 0  . albuterol (VENTOLIN HFA) 108 (90 Base) MCG/ACT inhaler Inhale 2 puffs into the lungs every 4 (four) hours as needed for wheezing or shortness of breath. (Patient not taking: Reported on 01/16/2021) 1 each 0  . rosuvastatin (CRESTOR) 40 MG tablet Take 1 tablet (40 mg total) by mouth daily. 90 tablet 3  . predniSONE (DELTASONE) 10 MG tablet 5 tabs po qd x 2 days, then 4 tabs po  qd x 2d, then 3 tabs po qd x 2d, then 2 tabs po qd x 2d, then 1 tab po qd x 2d (Patient not taking: Reported on 01/16/2021) 30 tablet 0   No facility-administered medications prior to visit.    Allergies  Allergen Reactions  . Ace Inhibitors Cough  . Shrimp [Shellfish Allergy] Other (See Comments)    Swelling around eyes/lips---can eat IF cooked WELL  . Thiazide-Type Diuretics     FATIGUE; DIZZINESS.      ROS As per HPI  PE: Vitals with BMI 01/16/2021 10/07/2020 07/18/2020  Height 5\' 4"  5\' 4"  5\' 4"   Weight 162 lbs 3 oz 166 lbs 6 oz 159 lbs 10 oz  BMI 27.83 32.20 25.42  Systolic 706 237 628  Diastolic 83 74 72  Pulse 64 91 90     Gen: Alert, well appearing.  Patient is oriented to person, place, time, and situation. AFFECT: pleasant, lucid thought and speech. CV: RRR, no m/r/g.   LUNGS: CTA bilat, nonlabored resps, good aeration in all lung fields. EXT: no clubbing or cyanosis.  no edema.  Skin; no rashes or focal lesions  LABS:  Lab Results  Component Value Date   TSH 1.20 04/03/2020   Lab Results  Component Value Date   WBC 4.1 04/03/2020   HGB 16.1 04/03/2020   HCT 47.1 04/03/2020   MCV 94.9 04/03/2020   PLT 146.0 (L) 04/03/2020   Lab Results  Component Value Date   IRON 173 04/03/2020   TIBC  CANCELED 04/03/2020   FERRITIN 56 04/03/2020   Lab Results  Component Value Date   VITAMINB12 904 07/01/2017   Lab Results  Component Value Date   CREATININE 1.31 (H) 07/18/2020   BUN 22 07/18/2020   NA 137 07/18/2020   K 4.2 07/18/2020   CL 95 (L) 07/18/2020   CO2 30 (H) 07/18/2020   Lab Results  Component Value Date   ALT 55 (H) 04/03/2020   AST 36 04/03/2020   ALKPHOS 50 04/03/2020   BILITOT 0.7 04/03/2020   Lab Results  Component Value Date   CHOL 153 04/03/2020   Lab Results  Component Value Date   HDL 35.90 (L) 04/03/2020   Lab Results  Component Value Date   LDLCALC 90 04/03/2020   Lab Results  Component Value Date   TRIG 135.0 04/03/2020   Lab Results  Component Value Date   CHOLHDL 4 04/03/2020   Lab Results  Component Value Date   PSA 0.56 04/03/2020   PSA 0.64 02/07/2019   PSA 0.68 12/08/2016   Lab Results  Component Value Date   HGBA1C 6.0 04/03/2020   IMPRESSION AND PLAN:  1) HTN: mild consistent elevation but pt most comfortable (after conversations with Dr. Ellyn Hack and me) with no med increase or addition at this time.  Cont micardis 40mg  qd and cont home monitoring and cont to work on better diet and exercise habits. Lytes/cr today.  2) HLD: tolerating crestor 40mg  qd since Oct 2021. LDL goal <70. LDL was 90 in July last year.   FLP and hepatic panel today.  3) Prediabetes: he continues to work on [place]. Hba1c today.  An After Visit Summary was printed and given to the patient.  FOLLOW UP: Return in about 6 months (around 07/19/2021) for annual CPE (fasting).  Signed:  Crissie Sickles, MD           01/16/2021

## 2021-03-06 ENCOUNTER — Telehealth: Payer: Self-pay | Admitting: Family Medicine

## 2021-03-06 MED ORDER — CLOPIDOGREL BISULFATE 75 MG PO TABS: 1.0000 | ORAL_TABLET | Freq: Every day | ORAL | 1 refills | Status: DC

## 2021-03-06 NOTE — Telephone Encounter (Signed)
LM for pt to return call regarding refill. Rx sent for 90 d supply with 1 additional refill. Pt was last seen 01/16/21, advised to f/u 6 months.

## 2021-03-06 NOTE — Telephone Encounter (Signed)
Spoke with pt regarding rx refill; he was made aware refill available for pick up.

## 2021-03-06 NOTE — Telephone Encounter (Signed)
Caller Name: Ronalee Belts, self Call back phone #: [phone]  MEDICATION(S): clopidogrel (PLAVIX) 75 MG tablet Pt is out of medication beginning today  Has the patient contacted their pharmacy? Yes.  No refills Last OV 01/16/2021  [id number] [place] [place] [address]

## 2021-03-27 ENCOUNTER — Other Ambulatory Visit: Payer: Self-pay | Admitting: Family Medicine

## 2021-05-01 IMAGING — US US SCROTUM
1 series · 14 of 25 positions shown · non-contrast
Comparison: None.

CLINICAL DATA: Chronic right scrotal mass inferiorly.  Not tender.

EXAM:
ULTRASOUND OF SCROTUM
TECHNIQUE: Complete ultrasound examination of the testicles, epididymis, and
other scrotal structures was performed.

[Series 1: us scrotum · 0.05mm/px · 72 acquisitions, 14 frames shown]
[im 1/72]
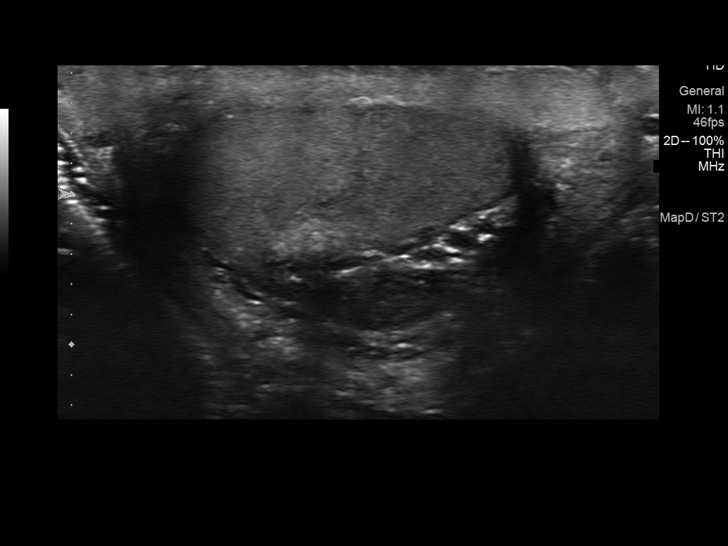
[im 6/72]
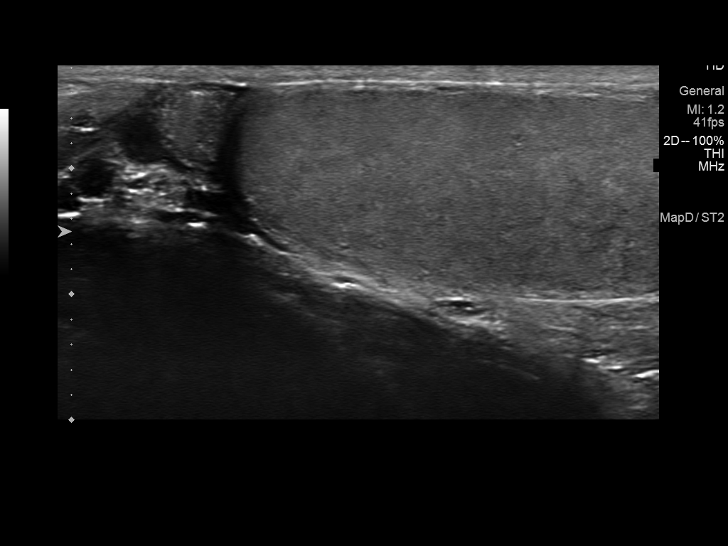
[im 12/72]
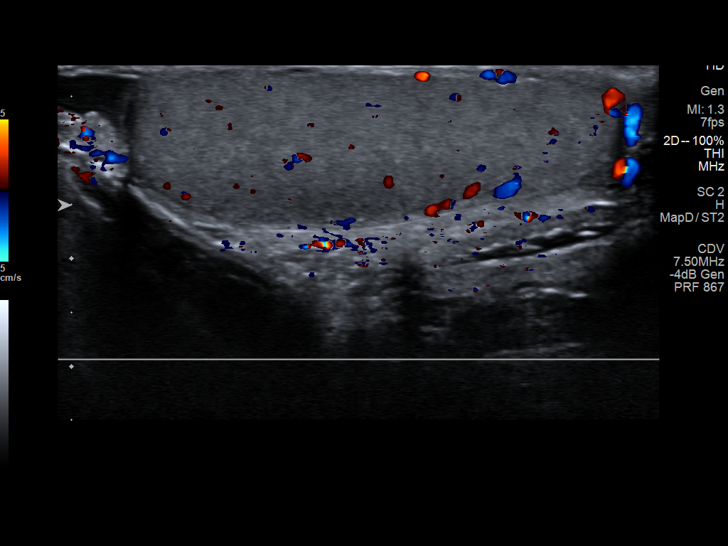
[im 18/72]
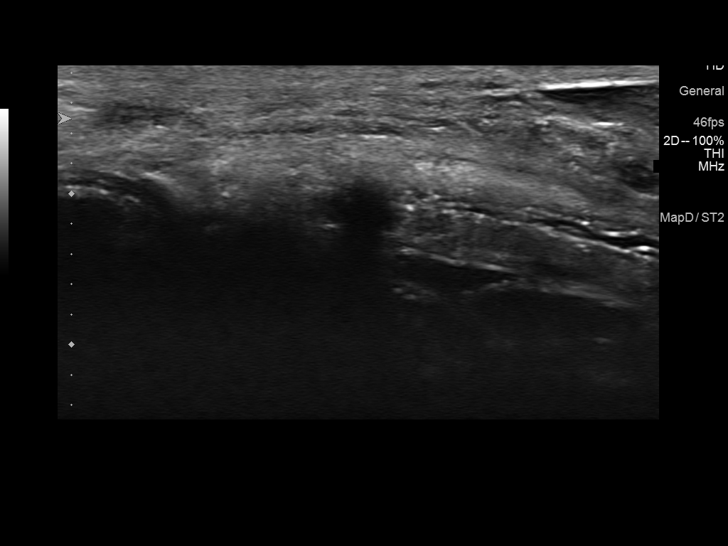
[im 24/72]
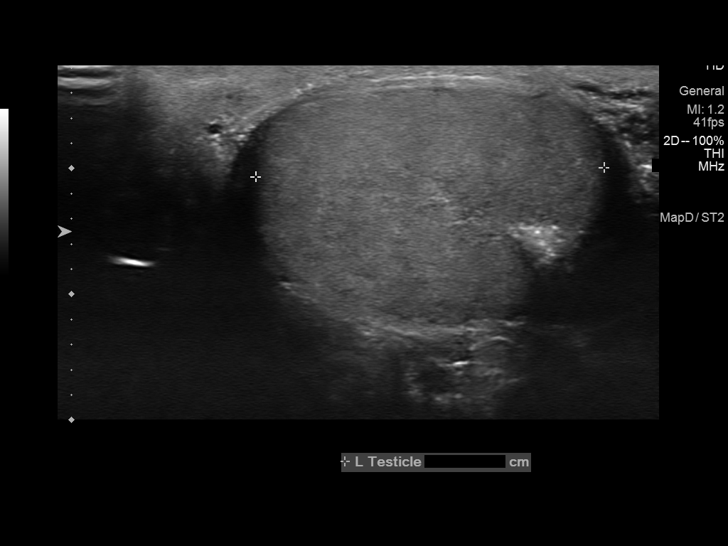
[im 27/72]
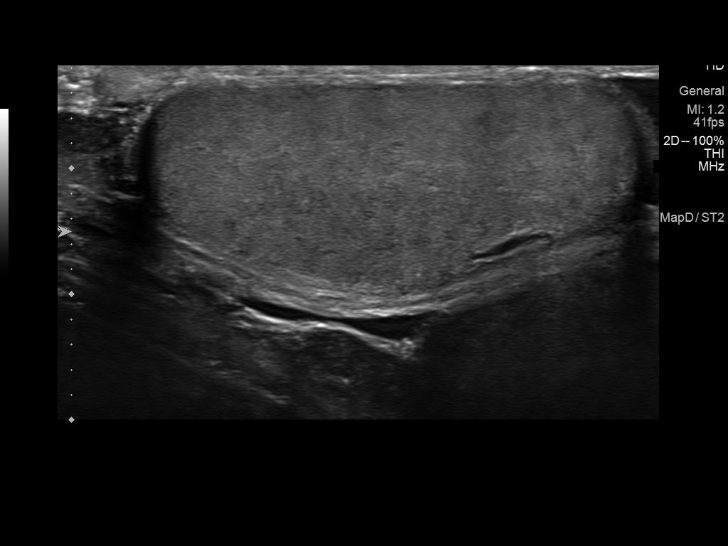
[im 33/72]
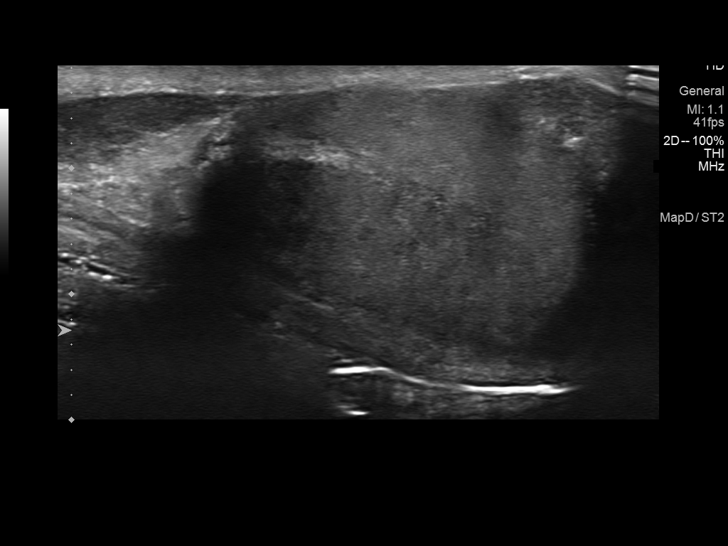
[im 39/72]
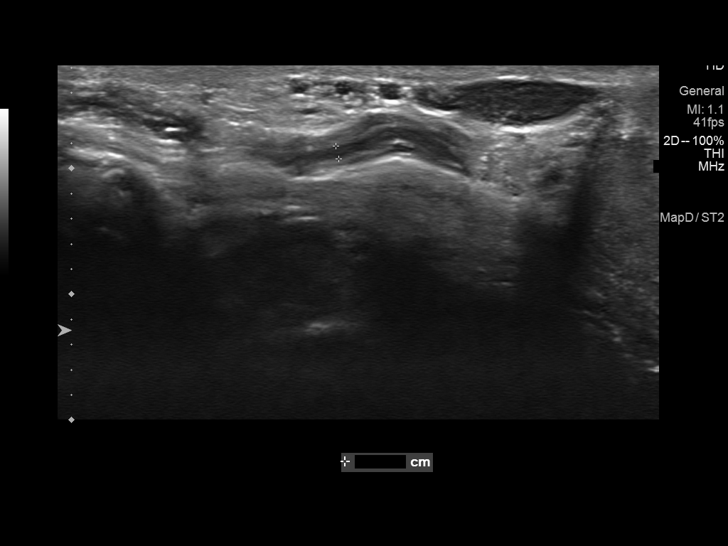
[im 45/72]
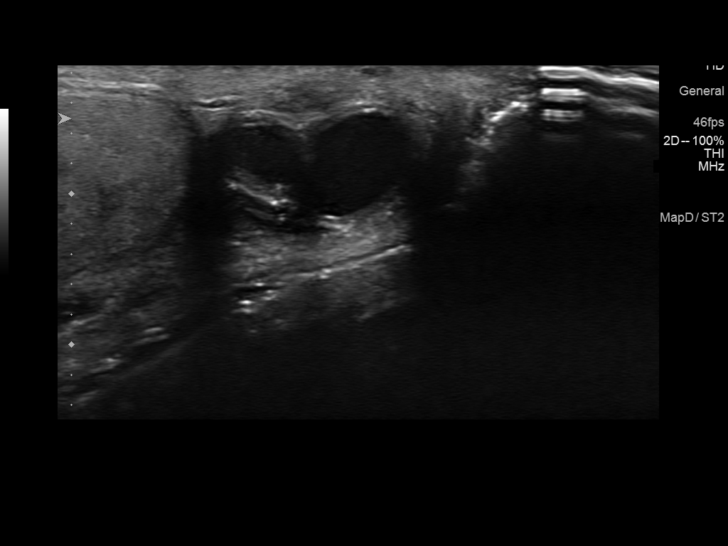
[im 48/72]
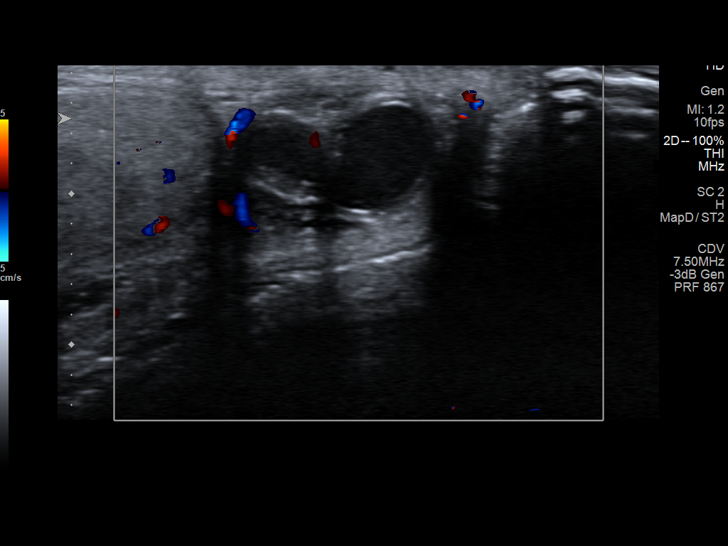
[im 54/72]
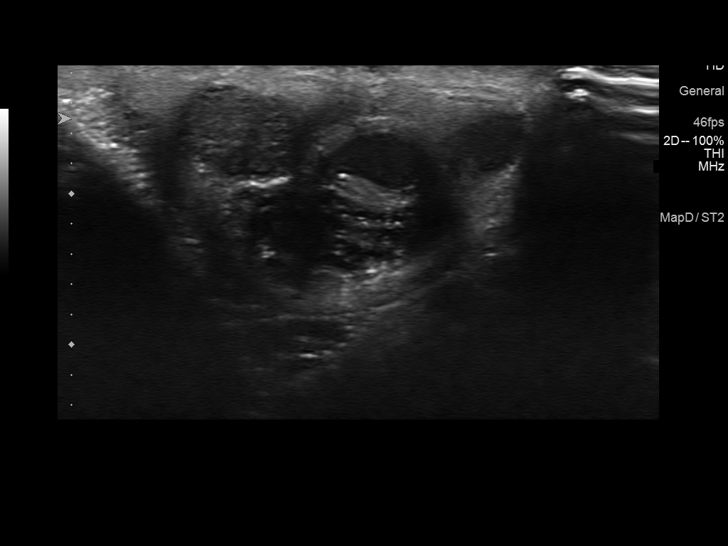
[im 60/72]
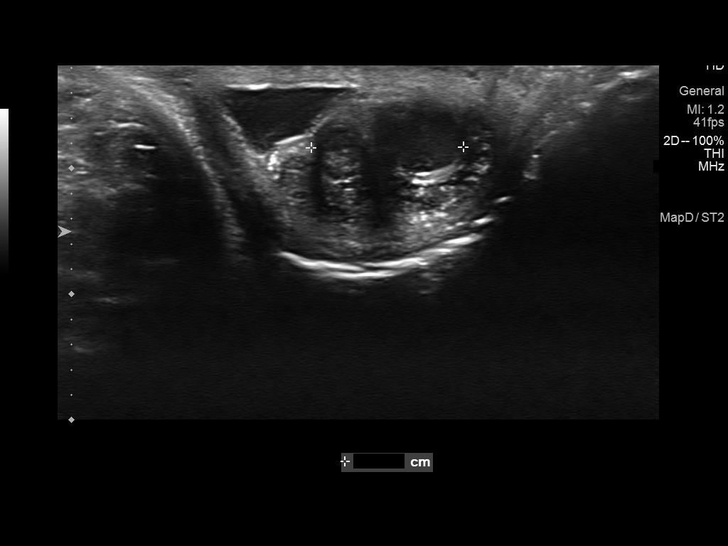
[im 66/72]
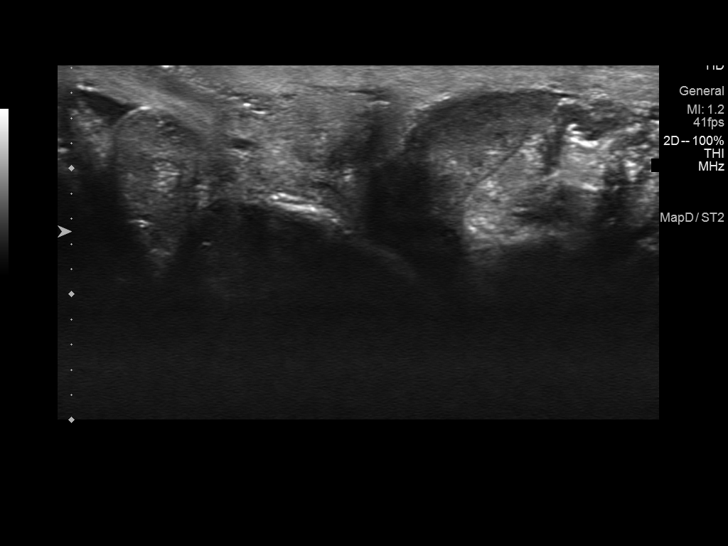
[im 72/72]
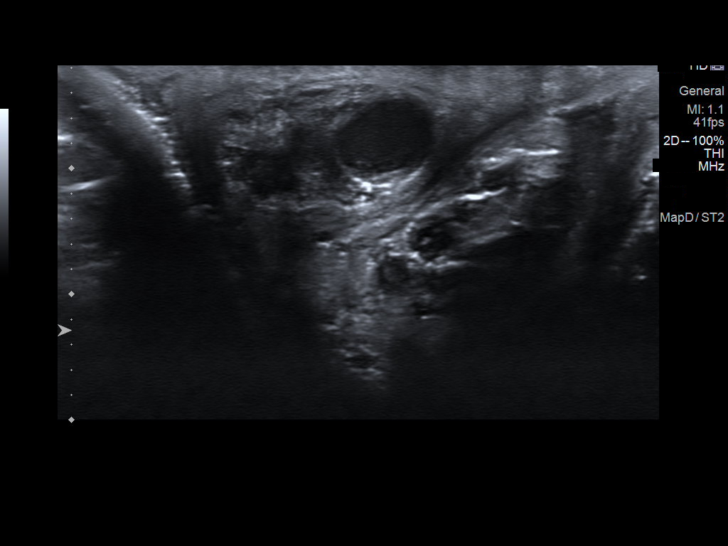

[14 of 25 positions shown; findings below may reference images not displayed]

FINDINGS: Right testicle

Measurements: 4.5 x 1.5 x 3.1 cm. No mass or microlithiasis
visualized.

Left testicle

Measurements: 3.8 x 1.7 x 2.8 cm. No mass or microlithiasis
visualized.

Right epididymis: Inferior to the right testicle there are cystic
areas with surrounding heterogeneous mostly hypoechoic tissue. These
may reflect epididymal cysts. The location below the testicle
suggests that this may be extra epididymal such as a hematocele.

Left epididymis:  Normal in size and appearance.

Hydrocele:  None visualized.

Varicocele:  None visualized.
IMPRESSION: 1. Normal appearance of the testicles.  No testicular masses.
2. Heterogeneous area with cystic spaces inferior to the right
testicle. These may be epididymal in origin such as epididymal cysts
or a spermatocele. This could alternatively be outside of the
epididymis, within the inferior scrotal sac. Consider a hematocele
if there has been remote trauma.

## 2021-06-12 ENCOUNTER — Other Ambulatory Visit: Payer: Self-pay | Admitting: Cardiology

## 2021-06-12 ENCOUNTER — Telehealth: Payer: Self-pay

## 2021-06-12 ENCOUNTER — Other Ambulatory Visit: Payer: Self-pay

## 2021-06-12 NOTE — Telephone Encounter (Signed)
Patient refill request.  [place]  rosuvastatin (CRESTOR) 40 MG tablet [[id number]]  ENDED

## 2021-06-12 NOTE — Telephone Encounter (Signed)
Pt was given 30 day supply by cardiologist, Dr,Harding with note to pharmacy, f/u appt needed for further refills. Pt has been made aware of this and voiced understanding. He also inquired about viagra rx, advised to contact the pharmacy about remaining refills.

## 2021-07-24 ENCOUNTER — Ambulatory Visit (HOSPITAL_BASED_OUTPATIENT_CLINIC_OR_DEPARTMENT_OTHER): Payer: BC Managed Care – PPO | Admitting: Family

## 2021-07-24 ENCOUNTER — Encounter (HOSPITAL_BASED_OUTPATIENT_CLINIC_OR_DEPARTMENT_OTHER): Payer: Self-pay | Admitting: Family

## 2021-07-24 ENCOUNTER — Other Ambulatory Visit: Payer: Self-pay

## 2021-07-24 VITALS — BP 122/78 | HR 59 | Ht 64.0 in | Wt 167.0 lb

## 2021-07-24 DIAGNOSIS — I10 Essential (primary) hypertension: Secondary | ICD-10-CM

## 2021-07-24 DIAGNOSIS — I25118 Atherosclerotic heart disease of native coronary artery with other forms of angina pectoris: Secondary | ICD-10-CM

## 2021-07-24 DIAGNOSIS — E785 Hyperlipidemia, unspecified: Secondary | ICD-10-CM

## 2021-07-24 MED ORDER — TELMISARTAN 40 MG PO TABS: 40.0000 mg | ORAL_TABLET | Freq: Every day | ORAL | 1 refills | Status: DC

## 2021-07-24 MED ORDER — ROSUVASTATIN CALCIUM 40 MG PO TABS: 40.0000 mg | ORAL_TABLET | Freq: Every day | ORAL | 1 refills | Status: DC

## 2021-07-24 NOTE — Patient Instructions (Addendum)
Medication Instructions:  Continue your current medications.   *If you need a refill on your cardiac medications before your next appointment, please call your pharmacy*  Lab Work: Your physician recommends that you return for lab work tomorrow or next week for CMP, fasting lipid panel  Your provider has recommended lab work. Please have this collected at [facility] [place]. The lab is open 8:00 am - 4:30 pm. Please avoid 12:00p - 1:00p for lunch hour. You do not need an appointment. Please go to [address] [place], [place] [address]. This is in the Primary Care office on the 3rd floor, let them know you are there for blood work and they will direct you to the lab.   If you have labs (blood work) drawn today and your tests are completely normal, you will receive your results only by: [place] (if you have MyChart) OR A paper copy in the mail If you have any lab test that is abnormal or we need to change your treatment, we will call you to review the results.   Testing/Procedures: Your EKG today was stable compared to previous.   Follow-Up: At [facility], you and your health needs are our priority.  As part of our continuing mission to provide you with exceptional heart care, we have created designated Provider Care Teams.  These Care Teams include your primary Cardiologist (physician) and Advanced Practice Providers (APPs -  Physician Assistants and Nurse Practitioners) who all work together to provide you with the care you need, when you need it.  We recommend signing up for the patient portal called "MyChart".  Sign up information is provided on this After Visit Summary.  MyChart is used to connect with patients for Virtual Visits (Telemedicine).  Patients are able to view lab/test results, encounter notes, upcoming appointments, etc.  Non-urgent messages can be sent to your provider as well.   To learn more about what you can do with MyChart, go  to [email or web address].    Your next appointment:   As needed  Other Instructions  Heart Healthy Diet Recommendations: A low-salt diet is recommended. Meats should be grilled, baked, or boiled. Avoid fried foods. Focus on lean protein sources like fish or chicken with vegetables and fruits. The American Heart Association is a Microbiologist!  American Heart Association Diet and Lifeystyle Recommendations    Exercise recommendations: The American Heart Association recommends 150 minutes of moderate intensity exercise weekly. Try 30 minutes of moderate intensity exercise 4-5 times per week. This could include walking, jogging, or swimming.

## 2021-07-24 NOTE — Progress Notes (Signed)
Office Visit    Patient Name: Thomas Spencer Date of Encounter: 07/24/2021  PCP:  Tammi Sou, MD   [place]  Cardiologist:  Glenetta Hew, MD  Advanced Practice Provider:  No care team member to display Electrophysiologist:  None      Chief Complaint    Thomas Spencer is a 55 y.o. male with a hx of CAD, HTN, HLD presents today for follow up of coronary disease.   Past Medical History    Past Medical History:  Diagnosis Date   Acute epiglottitis 01/2014   BPH with obstruction/lower urinary tract symptoms    [facility] referral done 01/[facility] (Dr. Risa Grill); ultimately a prostate biopsy was done for increased PSA velocity--April 2013--and it  was completely normal.   CAD S/P percutaneous coronary angioplasty 05/05/2017   Progressive Angina: (3 V CAD on Cor Calcium Score) --> LEFT HEART CATH & CORONARY ANGIOGRAPHY  / CORONARY STENT INTERVENTION Conclusion   LESION #1: p-mLAD to Mid LAD 80 %s --> PCI SYNERGY DES 2.75X16 - Tapered post-dilation: 3.1-2.9 mm.  LESION #2: pRI 85 % - PCI SYNERGY DES 3X12.  Initial plan ASA/Plavix x 1 yr  --> ASA stopped 06/2017 for GI Bleed-- on Plavix alone and continued w/this 11/2017   Elevated coronary artery calcium score 03/2017   indicating possible 3 vessel CAD: cardiac cath per Dr. Ellyn Hack 8/22 revealed 2 Vessel obstructive CAD in LAD & RI    Elevated liver function tests    mild   Epididymal cyst 09/2019   Right (vs spermatocele.  Hematocele less likely since no hx of trauma).   Erectile dysfunction    Family history of colon cancer    father   Family history of hypertension    strong family history   Family history of premature coronary heart disease    strong family history; Mother (MI in 35s, then 78s, s/p Valve Sgx in 84s) & oldest Brother (CABG x 4 in 73s)   History of adenomatous polyp of colon 2007; 2014; 08/2019   Dr. Henrene Pastor did 2020 scope.  Recall 2025.   History of sciatica 11/2013   responded well  to prednisone burst 11/2013   Hyperlipidemia    On 40 mg Crestor as of 06/2017   Hypertension    On telmisartan   Iron deficiency anemia 06/2017   Hb drop from 15 down to 11 from 04/2017-06/2017.  +H pylori gastritis+.  Recurrence 01/2019 +correlation with melena and fatigue.  Pt has been back on iron x 2 wks and is making arrangements to see his GI MD as of 02/08/19 (? repeat EGD + is overdue for repeat colonoscopy).   Prediabetes 11/2016   HbA1c 6.1%    Past Surgical History:  Procedure Laterality Date   CARDIOVASCULAR STRESS TEST  11/16/2019   Normal EF, no ischemia   COLONOSCOPY W/ POLYPECTOMY  09/02/06; 07/2013; 08/2019   2014 [facility]: adenomatous polyp w/out high grade dysplasia or malignancy.  08/2019 adenoma x 2, diverticulosis, recall 5 yrs.   coronary calcium score     Very high risk: as of 03/2017 pt is to return to discuss with Dr. Ellyn Hack: stress test vs cath as next step.   CORONARY STENT INTERVENTION N/A 05/05/2017   DES x 2. Preserved LV function.  Procedure: CORONARY STENT INTERVENTION;  Surgeon: Leonie Man, MD;  Location: [facility];  Service: Cardiovascular;: CAD S/P PCI: p-mLAD Synergy DES 2.75 x 16 (3.1-2.9 mm), p-mRI Synergy DES 3.0  x 12 (3.3 mm)   dental implants     ESOPHAGOGASTRODUODENOSCOPY  07/08/2017   GI RECOMMENDED D/C ASPIRIN:  superfic non-bleeding ulcers ranging 5-50mm in size in antrum.  +H pylori +.  A few non-bleeding erosions in pre-pyloric region.  Normal duodenum.   LEFT HEART CATH AND CORONARY ANGIOGRAPHY N/A 05/05/2017   Procedure: LEFT HEART CATH AND CORONARY ANGIOGRAPHY;  Surgeon: Leonie Man, MD;  Location: [facility];  Service: Cardiovascular;  Laterality: N/A; p-m LAD 80%, mRI 90% --> 2 vessel PCI   NM MYOVIEW LTD  11/16/2019   Fixed basal inferoseptal and mid anteroseptal perfusion defect consistent with subendocardial scar, NO ISCHEMIA.  EF 52%.  Unable to interpret EKG.  LOW Risk.   PROSTATE BIOPSY  01/2012   Normal     Allergies  Allergies  Allergen Reactions   Ace Inhibitors Cough   Shrimp [Shellfish Allergy] Other (See Comments)    Swelling around eyes/lips---can eat IF cooked WELL   Thiazide-Type Diuretics     FATIGUE; DIZZINESS.      History of Present Illness    Thomas Spencer is a 55 y.o. male with a hx of CAD, HLD, HTN last seen 10/07/20 by Dr. Ellyn Hack.  Previous cardiac catheterization 04/2017 with severe two vessel disease in mid LAD and proximal ramus intermedius both treated with DES. He had myoview 11/2019 which was low risk study. Previous intolerance to HCTZZ with fatigue and dizziness.   Presents today for follow up. Tells me he and his wife were recently able to vacation to an [place] of [place] which was enjoyable. He works as an Forensic psychologist. Since last seen he has made lifestyle changes and is working out at the gym 3 days per week with goal to get to 4 days per week. He starts with cardio then does weight lifting. Endorses following a heart healthy diet. Reports no shortness of breath nor dyspnea on exertion. Reports no chest pain, pressure, or tightness. No edema, orthopnea, PND. Reports no palpitations.    EKGs/Labs/Other Studies Reviewed:   The following studies were reviewed today: Myoview 11/2019 EKG nondiagnostic due to baseline ST Changes Myovue scan with probable normal perfusion, cannot exclude small region of subendocardial scar (anteroseptal, mid). No significant ischemia LVEF 52% Low risk scan  [facility] 04/2017 LESION #1: Prox LAD to Mid LAD lesion, 80 %stenosed. A STENT SYNERGY DES [id number] drug eluting stent was successfully placed. - Postdilated and tapered fashion to 3.1-2.9 mm Post intervention, there is a 0% residual stenosis. LESION #2: Ramus lesion, 85 %stenosed. A STENT SYNERGY DES 3X12 drug eluting stent was successfully placed. Postdilated to 3.3 mm Post intervention, there is a 0% residual stenosis. There is hyperdynamic left ventricular systolic  function. The left ventricular ejection fraction is greater than 65% by visual estimate. LV end diastolic pressure is normal.   Severe 2 vessel disease involving the mid LAD and proximal ramus intermedius treated with 2 DES stents. Otherwise mild to moderate disease elsewhere.  Hyperdynamic left ventricle with normal LVEDP.   Plan: Overnight monitoring and post procedure unit with TR band removal per protocol. - With 2 vessel PCI, not appropriate for same-day discharge Dual and platelet therapy for minimum one year with aspirin and Plavix Increase simvastatin to 40 mm daily Continue ARB, but no beta blocker due to bradycardia Likely discharge tomorrow.    EKG:  EKG is ordered today.  The ekg ordered today demonstrates SB 5 9bpm with no acute ST/T wave changes.   Recent  Labs: 01/16/2021: ALT 48; BUN 22; Creatinine, Ser 1.44; Potassium 4.9; Sodium 138  Recent Lipid Panel    Component Value Date/Time   CHOL 131 01/16/2021 1136   TRIG 169.0 (H) 01/16/2021 1136   HDL 30.70 (L) 01/16/2021 1136   CHOLHDL 4 01/16/2021 1136   VLDL 33.8 01/16/2021 1136   LDLCALC 66 01/16/2021 1136   LDLDIRECT 72.0 12/08/2016 0943     Home Medications   Current Meds  Medication Sig   clopidogrel (PLAVIX) 75 MG tablet Take 1 tablet (75 mg total) by mouth daily with breakfast.   fluticasone (CUTIVATE) 0.05 % cream Apply to affected area bid prn   Multiple Vitamin (MULTIVITAMIN WITH MINERALS) TABS tablet Take 1 tablet by mouth daily.   pantoprazole (PROTONIX) 40 MG tablet TAKE 1 TABLET BY MOUTH TWICE A DAY (Patient taking differently: daily.)   rosuvastatin (CRESTOR) 40 MG tablet TAKE 1 TABLET BY MOUTH EVERY DAY   sildenafil (VIAGRA) 100 MG tablet Take 1 tablet (100 mg total) by mouth daily as needed for erectile dysfunction.   telmisartan (MICARDIS) 40 MG tablet Take 1 tablet (40 mg total) by mouth daily.     Review of Systems      All other systems reviewed and are otherwise negative except as noted  above.  Physical Exam    VS:  BP 122/78   Pulse (!) 59   Ht 5\' 4"  (1.626 m)   Wt 167 lb (75.8 kg)   BMI 28.67 kg/m  , BMI Body mass index is 28.67 kg/m.  Wt Readings from Last 3 Encounters:  07/24/21 167 lb (75.8 kg)  01/16/21 162 lb 3.2 oz (73.6 kg)  10/07/20 166 lb 6.4 oz (75.5 kg)    GEN: Well nourished, well developed, in no acute distress. HEENT: normal. Neck: Supple, no JVD, carotid bruits, or masses. Cardiac: RRR, no murmurs, rubs, or gallops. No clubbing, cyanosis, edema.  Radials/PT 2+ and equal bilaterally.  Respiratory:  Respirations regular and unlabored, clear to auscultation bilaterally. GI: Soft, nontender, nondistended. MS: No deformity or atrophy. Skin: Warm and dry, no rash. Neuro:  Strength and sensation are intact. Psych: Normal affect.  Assessment & Plan    CAD - s/p DES to LAD and RCI 2018. Myoview 11/2019 low risk. Stable with no anginal symptoms. No indication for ischemic evaluation.  GDMT includes Plavix, Rosuvastatin. No BB due to baseline bradycardia. No aspirin due to previous GI upset. Heart healthy diet and regular cardiovascular exercise encouraged.    HLD - 01/16/21 total cholesterol 131, HDL 30.7, LDL 66, triglycerides 169. Continue Crestor 40mg  QD. Plan for repeat lipid panel, CMP to ensure triglycerides to goal of <150 after lifestyle changes. If above 150, consider addition of Vascepa.  HTN - BP well controlled. Continue current antihypertensive regimen.    Disposition: Patient prefers to establish with Dr. Oval Linsey due to proximity and will route request to her and Dr. Ellyn Hack for approval.  Signed, Loel Dubonnet, NP 07/24/2021, 9:30 AM [place]

## 2021-07-26 ENCOUNTER — Encounter: Payer: Self-pay | Admitting: Cardiology

## 2021-07-29 ENCOUNTER — Encounter (HOSPITAL_BASED_OUTPATIENT_CLINIC_OR_DEPARTMENT_OTHER): Payer: Self-pay

## 2021-09-03 ENCOUNTER — Other Ambulatory Visit: Payer: Self-pay | Admitting: Family Medicine

## 2021-09-13 ENCOUNTER — Encounter (HOSPITAL_BASED_OUTPATIENT_CLINIC_OR_DEPARTMENT_OTHER): Payer: Self-pay

## 2021-09-16 DIAGNOSIS — E785 Hyperlipidemia, unspecified: Secondary | ICD-10-CM | POA: Diagnosis not present

## 2021-09-16 LAB — COMPREHENSIVE METABOLIC PANEL
ALT: 67 IU/L — ABNORMAL HIGH (ref 0–44)
AST: 52 IU/L — ABNORMAL HIGH (ref 0–40)
Albumin/Globulin Ratio: 2 (ref 1.2–2.2)
Albumin: 4.5 g/dL (ref 3.8–4.9)
Alkaline Phosphatase: 67 IU/L (ref 44–121)
BUN/Creatinine Ratio: 14 (ref 9–20)
BUN: 19 mg/dL (ref 6–24)
Bilirubin Total: 0.4 mg/dL (ref 0.0–1.2)
CO2: 24 mmol/L (ref 20–29)
Calcium: 9.3 mg/dL (ref 8.7–10.2)
Chloride: 100 mmol/L (ref 96–106)
Creatinine, Ser: 1.32 mg/dL — ABNORMAL HIGH (ref 0.76–1.27)
Globulin, Total: 2.3 g/dL (ref 1.5–4.5)
Glucose: 105 mg/dL — ABNORMAL HIGH (ref 70–99)
Potassium: 4.4 mmol/L (ref 3.5–5.2)
Sodium: 136 mmol/L (ref 134–144)
Total Protein: 6.8 g/dL (ref 6.0–8.5)
eGFR: 63 mL/min/{1.73_m2} (ref >59)

## 2021-09-16 LAB — LIPID PANEL
Chol/HDL Ratio: 4.7 ratio (ref 0.0–5.0)
Cholesterol, Total: 147 mg/dL (ref 100–199)
HDL: 31 mg/dL — ABNORMAL LOW (ref >39)
LDL Chol Calc (NIH): 89 mg/dL (ref 0–99)
Triglycerides: 153 mg/dL — ABNORMAL HIGH (ref 0–149)
VLDL Cholesterol Cal: 27 mg/dL (ref 5–40)

## 2021-09-18 ENCOUNTER — Telehealth (HOSPITAL_BASED_OUTPATIENT_CLINIC_OR_DEPARTMENT_OTHER): Payer: Self-pay

## 2021-09-18 ENCOUNTER — Telehealth: Payer: Self-pay | Admitting: Family

## 2021-09-18 DIAGNOSIS — E785 Hyperlipidemia, unspecified: Secondary | ICD-10-CM

## 2021-09-18 MED ORDER — ROSUVASTATIN CALCIUM 20 MG PO TABS: 20.0000 mg | ORAL_TABLET | Freq: Every day | ORAL | 3 refills | Status: DC

## 2021-09-18 NOTE — Telephone Encounter (Signed)
See results management. 

## 2021-09-18 NOTE — Telephone Encounter (Signed)
Patient returning call for lab results. 

## 2021-09-18 NOTE — Telephone Encounter (Signed)
-----   Message from Loel Dubonnet, NP sent at 09/17/2021  7:56 AM EST ----- Cholesterol panel shows total cholesterol at goal. LDL (bad cholesterol) above goal. Triglycerides improved compared to previous. Stable kidney function. Normal electrolytes. Liver enzymes are elevated. Recommend reducing intake of Acetaminophen, alcohol, fried foods. Recommend reducing Crestor to 20mg  daily as this can affect liver enzymes. Recommend referral to lipid clinic for discussion of cholesterol management.

## 2021-09-18 NOTE — Telephone Encounter (Signed)
Thank you! Reduced dose is to help improve liver function. Hopefully lipid clinic will be able to help [place] find a cholesterol medication that gets LDL to goal without affecting liver enzymes.   Loel Dubonnet, NP

## 2021-09-18 NOTE — Telephone Encounter (Signed)
Patient returned call to get lab results. Patient agreeable to lipid clinic referral.    Patient is also hesitant but agreeable to reducing Crestor from 40mg  to 20mg . He does state that he was previously on 20mg  and had to be moved to 40mg , but is willing to try the reduced dosing!

## 2021-09-26 ENCOUNTER — Other Ambulatory Visit: Payer: Self-pay

## 2021-09-26 ENCOUNTER — Telehealth: Payer: Self-pay | Admitting: Family Medicine

## 2021-09-26 ENCOUNTER — Ambulatory Visit (HOSPITAL_BASED_OUTPATIENT_CLINIC_OR_DEPARTMENT_OTHER): Payer: BC Managed Care – PPO | Admitting: Cardiovascular Disease

## 2021-09-26 ENCOUNTER — Other Ambulatory Visit: Payer: Self-pay | Admitting: Family Medicine

## 2021-09-26 ENCOUNTER — Encounter (HOSPITAL_BASED_OUTPATIENT_CLINIC_OR_DEPARTMENT_OTHER): Payer: Self-pay | Admitting: Cardiovascular Disease

## 2021-09-26 VITALS — BP 140/80 | HR 61 | Ht 64.0 in | Wt 167.6 lb

## 2021-09-26 DIAGNOSIS — Z7189 Other specified counseling: Secondary | ICD-10-CM | POA: Diagnosis not present

## 2021-09-26 DIAGNOSIS — E785 Hyperlipidemia, unspecified: Secondary | ICD-10-CM | POA: Diagnosis not present

## 2021-09-26 DIAGNOSIS — I2510 Atherosclerotic heart disease of native coronary artery without angina pectoris: Secondary | ICD-10-CM

## 2021-09-26 DIAGNOSIS — I10 Essential (primary) hypertension: Secondary | ICD-10-CM | POA: Diagnosis not present

## 2021-09-26 DIAGNOSIS — Z9861 Coronary angioplasty status: Secondary | ICD-10-CM

## 2021-09-26 MED ORDER — SILDENAFIL CITRATE 100 MG PO TABS: 100.0000 mg | ORAL_TABLET | Freq: Every day | ORAL | 1 refills | Status: DC | PRN

## 2021-09-26 MED ORDER — PANTOPRAZOLE SODIUM 40 MG PO TBEC: 40.0000 mg | DELAYED_RELEASE_TABLET | Freq: Two times a day (BID) | ORAL | 0 refills | Status: DC

## 2021-09-26 NOTE — Assessment & Plan Note (Signed)
Blood pressure is above goal both initially and on repeat.  He is going to work on limiting his sodium intake.  Continue telmisartan.  He is not on a beta-blocker due to bradycardia.  If his blood pressure remains above goal we will add a thiazide diuretic.

## 2021-09-26 NOTE — Assessment & Plan Note (Signed)
Patient's insurance pays for executive physical.  He would like to get abdominal aorta ultrasound.  We discussed the fact that carotid Dopplers and calcium score not helpful given that he already has coronary disease and we already aggressively managing his lipids.

## 2021-09-26 NOTE — Telephone Encounter (Signed)
Pt needing med refill for  sildenafil sildenafil (VIAGRA) 100 MG tablet  pantoprazole pantoprazole (PROTONIX) 40 MG tablet  Pt wanting to know can he get a supply before his appointment that's 2 weeks from now--[place]  Phone: [phone]

## 2021-09-26 NOTE — Patient Instructions (Addendum)
Medication Instructions:  Your physician recommends that you continue on your current medications as directed. Please refer to the Current Medication list given to you today.   *If you need a refill on your cardiac medications before your next appointment, please call your pharmacy*  Lab Work: FASTING LP/CMET IN 3 MONTHS PRIOR TO FOLLOW UP   If you have labs (blood work) drawn today and your tests are completely normal, you will receive your results only by: [place] (if you have MyChart) OR A paper copy in the mail If you have any lab test that is abnormal or we need to change your treatment, we will call you to review the results.  Testing/Procedures: Your physician has requested that you have an abdominal aorta duplex. During this test, an ultrasound is used to evaluate the aorta. Allow 30 minutes for this exam. Do not eat after midnight the day before and avoid carbonated beverages  Follow-Up: At [facility], you and your health needs are our priority.  As part of our continuing mission to provide you with exceptional heart care, we have created designated Provider Care Teams.  These Care Teams include your primary Cardiologist (physician) and Advanced Practice Providers (APPs -  Physician Assistants and Nurse Practitioners) who all work together to provide you with the care you need, when you need it.  We recommend signing up for the patient portal called "MyChart".  Sign up information is provided on this After Visit Summary.  MyChart is used to connect with patients for Virtual Visits (Telemedicine).  Patients are able to view lab/test results, encounter notes, upcoming appointments, etc.  Non-urgent messages can be sent to your provider as well.   To learn more about what you can do with MyChart, go to [email or web address].    Your next appointment:   01/01/2022 9:00 AM WITH DR [facility]   Other Instructions Exercise recommendations: The American Heart Association  recommends 150 minutes of moderate intensity exercise weekly. Try 30 minutes of moderate intensity exercise 4-5 times per week. This could include walking, jogging, or swimming.  LIMIT YOU FRIED/GREASY FOODS, CHEESES, AND RED MEATS   2 WEEKS PRIOR TO YOU FOLLOW UP MONITOR YOUR BLOOD PRESSURE TWICE A DAY, BRING READINGS TO YOUR FOLLOW UP

## 2021-09-26 NOTE — Telephone Encounter (Signed)
Pt advised refill sent until scheduled appt.

## 2021-09-26 NOTE — Assessment & Plan Note (Signed)
LDL goal is less than 70.  He is going to work on diet and exercise as above and repeat lipids prior to increasing his rosuvastatin

## 2021-09-26 NOTE — Assessment & Plan Note (Signed)
Status post LAD and ramus intermedius PCI.  He exercises regularly and has no angina.  Continue clopidogrel and rosuvastatin.  Lipids are not at goal but he wants to work on diet and exercise.  He is going to be committed to exercising at least 150 minutes and limiting fried foods, fatty foods, red meat, and cheese.  Repeat lipids in 3 months and reassess.

## 2021-09-26 NOTE — Progress Notes (Signed)
Cardiology Office Note   Date:  09/26/2021   ID:  [name], DOB 1966-01-21, MRN [id number]  PCP:  Tammi Sou, MD  Cardiologist:   Skeet Latch, MD   No chief complaint on file.   History of Present Illness: Thomas Spencer is a 56 y.o. male with CAD status post PCI, hypertension, and hyperlipidemia who presents for follow-up.  He previously had a cardiac catheterization 04/2017 and was found to have severe two-vessel disease in the mid LAD and proximal ramus intermedius.  Both were treated with drug-eluting stents.  His last stress test 11/2019 was low risk.  He previously followed with Dr. Ellyn Hack but chose to transition to the [place] office due to proximity to his home.  He last saw Laurann Montana, NP, on 07/2021 and was doing well.  He and repeat lab work that showed improvement in his lipids.  Overall he has felt well.  He has no chest pain or shortness of breath.  He exercises three days per week for about 45 minutes.  He and his wife eat out a couple days per week.  He notes that there is probably some room for improvement in his diet.  He does try to limit sodium intake and does not drink alcohol excessively.  He grew up in [place], [place] and works as an Clinical cytogeneticist.  Past Medical History:  Diagnosis Date   Acute epiglottitis 01/2014   BPH with obstruction/lower urinary tract symptoms    [facility] referral done 01/2011 (Dr. Risa Grill); ultimately a prostate biopsy was done for increased PSA velocity--April 2013--and it  was completely normal.   CAD S/P percutaneous coronary angioplasty 05/05/2017   Progressive Angina: (3 V CAD on Cor Calcium Score) --> LEFT HEART CATH & CORONARY ANGIOGRAPHY  / CORONARY STENT INTERVENTION Conclusion   LESION #1: p-mLAD to Mid LAD 80 %s --> PCI SYNERGY DES 2.75X16 - Tapered post-dilation: 3.1-2.9 mm.  LESION #2: pRI 85 % - PCI SYNERGY DES 3X12.  Initial plan ASA/Plavix x 1 yr  --> ASA stopped 06/2017 for GI Bleed-- on Plavix alone and  continued w/this 11/2017   Elevated coronary artery calcium score 03/2017   indicating possible 3 vessel CAD: cardiac cath per Dr. Ellyn Hack 8/22 revealed 2 Vessel obstructive CAD in LAD & RI    Elevated liver function tests    mild   Epididymal cyst 09/2019   Right (vs spermatocele.  Hematocele less likely since no hx of trauma).   Erectile dysfunction    Family history of colon cancer    father   Family history of hypertension    strong family history   Family history of premature coronary heart disease    strong family history; Mother (MI in 66s, then 4s, s/p Valve Sgx in 35s) & oldest Brother (CABG x 4 in 42s)   History of adenomatous polyp of colon 2007; 2014; 08/2019   Dr. Henrene Pastor did 2020 scope.  Recall 2025.   History of sciatica 11/2013   responded well to prednisone burst 11/2013   Hyperlipidemia    On 40 mg Crestor as of 06/2017   Hypertension    On telmisartan   Iron deficiency anemia 06/2017   Hb drop from 15 down to 11 from 04/2017-06/2017.  +H pylori gastritis+.  Recurrence 01/2019 +correlation with melena and fatigue.  Pt has been back on iron x 2 wks and is making arrangements to see his GI MD as of 02/08/19 (? repeat EGD + is overdue  for repeat colonoscopy).   Prediabetes 11/2016   HbA1c 6.1%     Past Surgical History:  Procedure Laterality Date   CARDIOVASCULAR STRESS TEST  11/16/2019   Normal EF, no ischemia   COLONOSCOPY W/ POLYPECTOMY  09/02/06; 07/2013; 08/2019   2014 [facility]: adenomatous polyp w/out high grade dysplasia or malignancy.  08/2019 adenoma x 2, diverticulosis, recall 5 yrs.   coronary calcium score     Very high risk: as of 03/2017 pt is to return to discuss with Dr. Ellyn Hack: stress test vs cath as next step.   CORONARY STENT INTERVENTION N/A 05/05/2017   DES x 2. Preserved LV function.  Procedure: CORONARY STENT INTERVENTION;  Surgeon: Leonie Man, MD;  Location: [facility];  Service: Cardiovascular;: CAD S/P PCI: p-mLAD Synergy DES 2.75 x  16 (3.1-2.9 mm), p-mRI Synergy DES 3.0 x 12 (3.3 mm)   dental implants     ESOPHAGOGASTRODUODENOSCOPY  07/08/2017   GI RECOMMENDED D/C ASPIRIN:  superfic non-bleeding ulcers ranging 5-62mm in size in antrum.  +H pylori +.  A few non-bleeding erosions in pre-pyloric region.  Normal duodenum.   LEFT HEART CATH AND CORONARY ANGIOGRAPHY N/A 05/05/2017   Procedure: LEFT HEART CATH AND CORONARY ANGIOGRAPHY;  Surgeon: Leonie Man, MD;  Location: [facility];  Service: Cardiovascular;  Laterality: N/A; p-m LAD 80%, mRI 90% --> 2 vessel PCI   NM MYOVIEW LTD  11/16/2019   Fixed basal inferoseptal and mid anteroseptal perfusion defect consistent with subendocardial scar, NO ISCHEMIA.  EF 52%.  Unable to interpret EKG.  LOW Risk.   PROSTATE BIOPSY  01/2012   Normal     Current Outpatient Medications  Medication Sig Dispense Refill   albuterol (VENTOLIN HFA) 108 (90 Base) MCG/ACT inhaler Inhale 2 puffs into the lungs every 4 (four) hours as needed for wheezing or shortness of breath. 1 each 0   clopidogrel (PLAVIX) 75 MG tablet Take 1 tablet (75 mg total) by mouth daily with breakfast. OFFICE VISIT NEEDED FOR FURTHER REFILLS 90 tablet 0   fluticasone (CUTIVATE) 0.05 % cream Apply to affected area bid prn 15 g 1   Multiple Vitamin (MULTIVITAMIN WITH MINERALS) TABS tablet Take 1 tablet by mouth daily.     pantoprazole (PROTONIX) 40 MG tablet TAKE 1 TABLET BY MOUTH TWICE A DAY (Patient taking differently: daily.) 180 tablet 1   rosuvastatin (CRESTOR) 20 MG tablet Take 1 tablet (20 mg total) by mouth daily. 90 tablet 3   sildenafil (VIAGRA) 100 MG tablet Take 1 tablet (100 mg total) by mouth daily as needed for erectile dysfunction. 6 tablet 5   telmisartan (MICARDIS) 40 MG tablet Take 1 tablet (40 mg total) by mouth daily. 90 tablet 1   No current facility-administered medications for this visit.    Allergies:   Ace inhibitors, Shrimp [shellfish allergy], and Thiazide-type diuretics    Social  History:  The patient  reports that he has never smoked. He has never used smokeless tobacco. He reports current alcohol use of about 2.0 standard drinks per week. He reports that he does not use drugs.   Family History:  The patient's family history includes CAD in his brother; Cancer in his father and sister; Coronary artery disease (age of onset: 32) in his brother; Diabetes in his sister and sister; Heart attack (age of onset: 83) in his mother; Heart disease (age of onset: 44) in his mother; Hyperlipidemia in his brother and mother; Hypertension in his brother, mother,  sister, and sister; Valvular heart disease (age of onset: 53) in his mother.    ROS:  Please see the history of present illness.   Otherwise, review of systems are positive for none.   All other systems are reviewed and negative.    PHYSICAL EXAM: VS:  BP 140/80 (BP Location: Left Arm, Patient Position: Sitting, Cuff Size: Normal)    Pulse 61    Ht 5\' 4"  (1.626 m)    Wt 167 lb 9.6 oz (76 kg)    SpO2 99%    BMI 28.77 kg/m  , BMI Body mass index is 28.77 kg/m. GENERAL:  Well appearing HEENT:  Pupils equal round and reactive, fundi not visualized, oral mucosa unremarkable NECK:  No jugular venous distention, waveform within normal limits, carotid upstroke brisk and symmetric, no bruits, no thyromegaly LYMPHATICS:  No cervical adenopath LUNGS:  Clear to auscultation bilaterally HEART:  RRR.  PMI not displaced or sustained,S1 and S2 within normal limits, no S3, no S4, no clicks, no rubs, no murmurs ABD:  Flat, positive bowel sounds normal in frequency in pitch, no bruits, no rebound, no guarding, no midline pulsatile mass, no hepatomegaly, no splenomegaly EXT:  2 plus pulses throughout, no edema, no cyanosis no clubbing SKIN:  No rashes no nodules NEURO:  Cranial nerves II through XII grossly intact, motor grossly intact throughout PSYCH:  Cognitively intact, oriented to person place and time   EKG:  EKG is not ordered  today.  Nuclear stress 11/2019: EKG nondiagnostic due to baseline ST Changes Myovue scan with probable normal perfusion, cannot exclude small region of subendocardial scar (anteroseptal, mid). No significant ischemia LVEF 52% Low risk scan   [facility] 04/2017 LESION #1: Prox LAD to Mid LAD lesion, 80 %stenosed. A STENT SYNERGY DES [id number] drug eluting stent was successfully placed. - Postdilated and tapered fashion to 3.1-2.9 mm Post intervention, there is a 0% residual stenosis. LESION #2: Ramus lesion, 85 %stenosed. A STENT SYNERGY DES 3X12 drug eluting stent was successfully placed. Postdilated to 3.3 mm Post intervention, there is a 0% residual stenosis. There is hyperdynamic left ventricular systolic function. The left ventricular ejection fraction is greater than 65% by visual estimate. LV end diastolic pressure is normal.   Severe 2 vessel disease involving the mid LAD and proximal ramus intermedius treated with 2 DES stents. Otherwise mild to moderate disease elsewhere.  Hyperdynamic left ventricle with normal LVEDP.  Recent Labs: 09/16/2021: ALT 67; BUN 19; Creatinine, Ser 1.32; Potassium 4.4; Sodium 136    Lipid Panel    Component Value Date/Time   CHOL 147 09/16/2021 0919   TRIG 153 (H) 09/16/2021 0919   HDL 31 (L) 09/16/2021 0919   CHOLHDL 4.7 09/16/2021 0919   CHOLHDL 4 01/16/2021 1136   VLDL 33.8 01/16/2021 1136   LDLCALC 89 09/16/2021 0919   LDLDIRECT 72.0 12/08/2016 0943      Wt Readings from Last 3 Encounters:  09/26/21 167 lb 9.6 oz (76 kg)  07/24/21 167 lb (75.8 kg)  01/16/21 162 lb 3.2 oz (73.6 kg)      ASSESSMENT AND PLAN:  CAD S/P PCI: p-mLAD Synergy DES 2.75 x 16 (3.1-2.9 mm), p-mRI Synergy DES 3.0 x 12 (3.3 mm) Status post LAD and ramus intermedius PCI.  He exercises regularly and has no angina.  Continue clopidogrel and rosuvastatin.  Lipids are not at goal but he wants to work on diet and exercise.  He is going to be committed to exercising at least  150 minutes  and limiting fried foods, fatty foods, red meat, and cheese.  Repeat lipids in 3 months and reassess.  Essential hypertension Blood pressure is above goal both initially and on repeat.  He is going to work on limiting his sodium intake.  Continue telmisartan.  He is not on a beta-blocker due to bradycardia.  If his blood pressure remains above goal we will add a thiazide diuretic.  Counseling on health promotion and disease prevention Patient's insurance pays for executive physical.  He would like to get abdominal aorta ultrasound.  We discussed the fact that carotid Dopplers and calcium score not helpful given that he already has coronary disease and we already aggressively managing his lipids.  Hyperlipidemia with target low density lipoprotein (LDL) cholesterol less than 70 mg/dL LDL goal is less than 70.  He is going to work on diet and exercise as above and repeat lipids prior to increasing his rosuvastatin   Current medicines are reviewed at length with the patient today.  The patient does not have concerns regarding medicines.  The following changes have been made:  no change  Labs/ tests ordered today include:   Orders Placed This Encounter  Procedures   Lipid panel   Comprehensive metabolic panel   VAS [place] AAA DUPLEX     Disposition:   FU with Corydon Schweiss C. Oval Linsey, MD, [facility] in 3 months.     Signed, Saretta Dahlem C. Oval Linsey, MD, [facility]  09/26/2021 1:13 PM    [facility]

## 2021-10-06 ENCOUNTER — Ambulatory Visit (INDEPENDENT_AMBULATORY_CARE_PROVIDER_SITE_OTHER): Payer: BC Managed Care – PPO | Admitting: Family Medicine

## 2021-10-06 ENCOUNTER — Other Ambulatory Visit: Payer: Self-pay

## 2021-10-06 ENCOUNTER — Encounter: Payer: Self-pay | Admitting: Family Medicine

## 2021-10-06 VITALS — BP 120/76 | HR 89 | Temp 97.4°F | Ht 64.0 in | Wt 165.4 lb

## 2021-10-06 DIAGNOSIS — E7800 Pure hypercholesterolemia, unspecified: Secondary | ICD-10-CM | POA: Diagnosis not present

## 2021-10-06 DIAGNOSIS — N529 Male erectile dysfunction, unspecified: Secondary | ICD-10-CM | POA: Diagnosis not present

## 2021-10-06 DIAGNOSIS — Z125 Encounter for screening for malignant neoplasm of prostate: Secondary | ICD-10-CM | POA: Diagnosis not present

## 2021-10-06 DIAGNOSIS — R7303 Prediabetes: Secondary | ICD-10-CM | POA: Diagnosis not present

## 2021-10-06 DIAGNOSIS — I10 Essential (primary) hypertension: Secondary | ICD-10-CM | POA: Diagnosis not present

## 2021-10-06 DIAGNOSIS — R7401 Elevation of levels of liver transaminase levels: Secondary | ICD-10-CM

## 2021-10-06 LAB — COMPREHENSIVE METABOLIC PANEL
ALT: 58 U/L — ABNORMAL HIGH (ref 0–53)
AST: 40 U/L — ABNORMAL HIGH (ref 0–37)
Albumin: 4.4 g/dL (ref 3.5–5.2)
Alkaline Phosphatase: 54 U/L (ref 39–117)
BUN: 22 mg/dL (ref 6–23)
CO2: 27 mEq/L (ref 19–32)
Calcium: 9.6 mg/dL (ref 8.4–10.5)
Chloride: 103 mEq/L (ref 96–112)
Creatinine, Ser: 1.44 mg/dL (ref 0.40–1.50)
GFR: 53.84 mL/min — ABNORMAL LOW (ref >60.00)
Glucose, Bld: 133 mg/dL — ABNORMAL HIGH (ref 70–99)
Potassium: 4.2 mEq/L (ref 3.5–5.1)
Sodium: 139 mEq/L (ref 135–145)
Total Bilirubin: 0.5 mg/dL (ref 0.2–1.2)
Total Protein: 7 g/dL (ref 6.0–8.3)

## 2021-10-06 LAB — CBC WITH DIFFERENTIAL/PLATELET
Basophils Absolute: 0 10*3/uL (ref 0.0–0.1)
Basophils Relative: 0.6 % (ref 0.0–3.0)
Eosinophils Absolute: 0.1 10*3/uL (ref 0.0–0.7)
Eosinophils Relative: 2.7 % (ref 0.0–5.0)
HCT: 40.6 % (ref 39.0–52.0)
Hemoglobin: 12.9 g/dL — ABNORMAL LOW (ref 13.0–17.0)
Lymphocytes Relative: 30.3 % (ref 12.0–46.0)
Lymphs Abs: 1.1 10*3/uL (ref 0.7–4.0)
MCHC: 31.8 g/dL (ref 30.0–36.0)
MCV: 80.8 fl (ref 78.0–100.0)
Monocytes Absolute: 0.4 10*3/uL (ref 0.1–1.0)
Monocytes Relative: 10.3 % (ref 3.0–12.0)
Neutro Abs: 2.1 10*3/uL (ref 1.4–7.7)
Neutrophils Relative %: 56.1 % (ref 43.0–77.0)
Platelets: 164 10*3/uL (ref 150.0–400.0)
RBC: 5.02 Mil/uL (ref 4.22–5.81)
RDW: 17.3 % — ABNORMAL HIGH (ref 11.5–15.5)
WBC: 3.8 10*3/uL — ABNORMAL LOW (ref 4.0–10.5)

## 2021-10-06 LAB — PSA: PSA: 0.73 ng/mL (ref 0.10–4.00)

## 2021-10-06 LAB — HEMOGLOBIN A1C: Hgb A1c MFr Bld: 6.5 % (ref 4.6–6.5)

## 2021-10-06 MED ORDER — TRIAMCINOLONE ACETONIDE 0.1 % EX CREA: TOPICAL_CREAM | CUTANEOUS | 3 refills | Status: DC

## 2021-10-06 MED ORDER — TADALAFIL 10 MG PO TABS: ORAL_TABLET | ORAL | 6 refills | Status: DC

## 2021-10-06 NOTE — Progress Notes (Signed)
See student note for this encounter. I have attested it. Signed:  Crissie Sickles, MD           02/03/2022

## 2021-10-06 NOTE — Progress Notes (Addendum)
Office Note 10/06/2021  CC:  Chief Complaint  Patient presents with   Follow-up    RCI; not fasting     HPI:  Patient is a 56 y.o. male who is here for annual health maintenance exam and f/u HTN, HLD, and prediabetes. I last saw him 8 mo ago: A/P as of last visit: "1) HTN: mild consistent elevation but pt most comfortable (after conversations with Dr. Ellyn Hack and me) with no med increase or addition at this time.  Cont micardis 40mg  qd and cont home monitoring and cont to work on better diet and exercise habits. Lytes/cr today.   2) HLD: tolerating crestor 40mg  qd since Oct 2021. LDL goal <70. LDL was 90 in July last year.   FLP and hepatic panel today.   3) Prediabetes: he continues to work on [place]. Hba1c today."  INTERIM HX: Patient says he is doing great. He continues to take Micardis 40 mg daily and is getting home BP around 121/81. He denies headaches or changes in vision. Patient is taking Crestor 20mg  daily and is working on his food habits to help lower his cholesterol and HTN. Patient has also been working on exercising and cutting sugar to combat his prediabetic state. Patient denies excessive urination, extreme thirst, or headaches. He will come in later this week to receive the pneumococcal vaccine.   Saw cardiology earlier this month.  He is getting an abdominal aortic ultrasound for AAA screening.  He has occasional itchy/irritated spots on his cheek, occasional one on his arm as well.  He has applied his daughters triamcinolone cream and it has helped.  He currently has no lesions.  He has tried Viagra in the past for erectile dysfunction and had suboptimal results.  He requests a trial of Cialis.  ROS as above, plus--> no fevers, no CP, no SOB, no wheezing, no cough, no dizziness, no HAs, no rashes, no melena/hematochezia.  No polyuria or polydipsia.  No myalgias or arthralgias.  No focal weakness, paresthesias, or tremors.  No acute vision or hearing  abnormalities.  No dysuria or unusual/new urinary urgency or frequency.  No recent changes in lower legs. No n/v/d or abd pain.  No palpitations.    Past Medical History:  Diagnosis Date   Acute epiglottitis 01/2014   BPH with obstruction/lower urinary tract symptoms    [facility] referral done 01/2011 (Dr. Risa Grill); ultimately a prostate biopsy was done for increased PSA velocity--April 2013--and it  was completely normal.   CAD S/P percutaneous coronary angioplasty 05/05/2017   Progressive Angina: (3 V CAD on Cor Calcium Score) --> LEFT HEART CATH & CORONARY ANGIOGRAPHY  / CORONARY STENT INTERVENTION Conclusion   LESION #1: p-mLAD to Mid LAD 80 %s --> PCI SYNERGY DES 2.75X16 - Tapered post-dilation: 3.1-2.9 mm.  LESION #2: pRI 85 % - PCI SYNERGY DES 3X12.  Initial plan ASA/Plavix x 1 yr  --> ASA stopped 06/2017 for GI Bleed-- on Plavix alone and continued w/this 11/2017   Elevated coronary artery calcium score 03/2017   indicating possible 3 vessel CAD: cardiac cath per Dr. Ellyn Hack 8/22 revealed 2 Vessel obstructive CAD in LAD & RI    Elevated liver function tests    mild   Epididymal cyst 09/2019   Right (vs spermatocele.  Hematocele less likely since no hx of trauma).   Erectile dysfunction    Family history of colon cancer    father   Family history of hypertension    strong family history  Family history of premature coronary heart disease    strong family history; Mother (MI in 47s, then 25s, s/p Valve Sgx in 68s) & oldest Brother (CABG x 4 in 40s)   History of adenomatous polyp of colon 2007; 2014; 08/2019   Dr. Henrene Pastor did 2020 scope.  Recall 2025.   History of sciatica 11/2013   responded well to prednisone burst 11/2013   Hyperlipidemia    On 40 mg Crestor as of 06/2017   Hypertension    On telmisartan   Iron deficiency anemia 06/2017   Hb drop from 15 down to 11 from 04/2017-06/2017.  +H pylori gastritis+.  Recurrence 01/2019 +correlation with melena and fatigue.  Pt has been  back on iron x 2 wks and is making arrangements to see his GI MD as of 02/08/19 (? repeat EGD + is overdue for repeat colonoscopy).   Prediabetes 11/2016   HbA1c 6.1%     Past Surgical History:  Procedure Laterality Date   CARDIOVASCULAR STRESS TEST  11/16/2019   Normal EF, no ischemia   COLONOSCOPY W/ POLYPECTOMY  09/02/06; 07/2013; 08/2019   2014 [facility]: adenomatous polyp w/out high grade dysplasia or malignancy.  08/2019 adenoma x 2, diverticulosis, recall 5 yrs.   coronary calcium score     Very high risk: as of 03/2017 pt is to return to discuss with Dr. Ellyn Hack: stress test vs cath as next step.   CORONARY STENT INTERVENTION N/A 05/05/2017   DES x 2. Preserved LV function.  Procedure: CORONARY STENT INTERVENTION;  Surgeon: Leonie Man, MD;  Location: [facility];  Service: Cardiovascular;: CAD S/P PCI: p-mLAD Synergy DES 2.75 x 16 (3.1-2.9 mm), p-mRI Synergy DES 3.0 x 12 (3.3 mm)   dental implants     ESOPHAGOGASTRODUODENOSCOPY  07/08/2017   GI RECOMMENDED D/C ASPIRIN:  superfic non-bleeding ulcers ranging 5-57mm in size in antrum.  +H pylori +.  A few non-bleeding erosions in pre-pyloric region.  Normal duodenum.   LEFT HEART CATH AND CORONARY ANGIOGRAPHY N/A 05/05/2017   Procedure: LEFT HEART CATH AND CORONARY ANGIOGRAPHY;  Surgeon: Leonie Man, MD;  Location: [facility];  Service: Cardiovascular;  Laterality: N/A; p-m LAD 80%, mRI 90% --> 2 vessel PCI   NM MYOVIEW LTD  11/16/2019   Fixed basal inferoseptal and mid anteroseptal perfusion defect consistent with subendocardial scar, NO ISCHEMIA.  EF 52%.  Unable to interpret EKG.  LOW Risk.   PROSTATE BIOPSY  01/2012   Normal    Family History  Problem Relation Age of Onset   Heart attack Mother 21   Heart disease Mother 53   Hyperlipidemia Mother    Hypertension Mother    Valvular heart disease Mother 67       s/p MVR-CABG   Cancer Father        colon   Cancer Sister        cervical/ remission    Diabetes Sister        type 2   Hypertension Sister    Hypertension Sister    Diabetes Sister        type 2   Hyperlipidemia Brother    Hypertension Brother    Coronary artery disease Brother 59       CABG x 4   CAD Brother     Social History   Socioeconomic History   Marital status: Married    Spouse name: Not on file   Number of children: 2   Years  of education: Not on file   Highest education level: Not on file  Occupational History   Occupation: Patent Lawyer    Comment: [employer].  Tobacco Use   Smoking status: Never   Smokeless tobacco: Never  Vaping Use   Vaping Use: Never used  Substance and Sexual Activity   Alcohol use: Yes    Alcohol/week: 2.0 standard drinks    Types: 2 Glasses of wine per week    Comment: weekly   Drug use: No   Sexual activity: Yes    Partners: Female  Other Topics Concern   Not on file  Social History Narrative   SH: Married, two teenagers (1 boy and 1 girl) as of 10/2015.   He is a Personal assistant, lives in [place], [place].   He has a PhD in a vertical engineering any [employer] in intellectual property   No Tobacco, rare ETOH, no drug use.   Exercise occasionally - most days the week he goes to the gym.         Social Determinants of Health   Financial Resource Strain: Low Risk    Difficulty of Paying Living Expenses: Not hard at all  Food Insecurity: No Food Insecurity   Worried About Charity fundraiser in the Last Year: Never true   [place] in the Last Year: Never true  Transportation Needs: No Transportation Needs   Lack of Transportation (Medical): No   Lack of Transportation (Non-Medical): No  Physical Activity: Sufficiently Active   Days of Exercise per Week: 3 days   Minutes of Exercise per Session: 50 min  Stress: Not on file  Social Connections: Not on file  Intimate Partner Violence: Not on file    Outpatient Medications Prior to Visit  Medication Sig Dispense Refill   clopidogrel (PLAVIX) 75 MG tablet Take  1 tablet (75 mg total) by mouth daily with breakfast. OFFICE VISIT NEEDED FOR FURTHER REFILLS 90 tablet 0   Multiple Vitamin (MULTIVITAMIN WITH MINERALS) TABS tablet Take 1 tablet by mouth daily.     pantoprazole (PROTONIX) 40 MG tablet Take 1 tablet (40 mg total) by mouth 2 (two) times daily. 60 tablet 0   rosuvastatin (CRESTOR) 20 MG tablet Take 1 tablet (20 mg total) by mouth daily. 90 tablet 3   telmisartan (MICARDIS) 40 MG tablet Take 1 tablet (40 mg total) by mouth daily. 90 tablet 1   albuterol (VENTOLIN HFA) 108 (90 Base) MCG/ACT inhaler Inhale 2 puffs into the lungs every 4 (four) hours as needed for wheezing or shortness of breath. (Patient not taking: Reported on 10/06/2021) 1 each 0   fluticasone (CUTIVATE) 0.05 % cream Apply to affected area bid prn (Patient not taking: Reported on 10/06/2021) 15 g 1   sildenafil (VIAGRA) 100 MG tablet Take 1 tablet (100 mg total) by mouth daily as needed for erectile dysfunction. (Patient not taking: Reported on 10/06/2021) 6 tablet 1   No facility-administered medications prior to visit.    Allergies  Allergen Reactions   Ace Inhibitors Cough   Shrimp [Shellfish Allergy] Other (See Comments)    Swelling around eyes/lips---can eat IF cooked WELL   Thiazide-Type Diuretics     FATIGUE; DIZZINESS.      ROS Per HPI  PE; Vitals with BMI 10/06/2021 09/26/2021 09/26/2021  Height 5\' 4"  - 5\' 4"   Weight 165 lbs 6 oz - 167 lbs 10 oz  BMI 46.96 - 29.52  Systolic 841 324 401  Diastolic 81 80  88  Pulse 89 - 61   Physical Exam Constitutional:      Appearance: Normal appearance.  Cardiovascular:     Rate and Rhythm: Normal rate and regular rhythm.  Pulmonary:     Effort: Pulmonary effort is normal.     Breath sounds: Normal breath sounds.  Neurological:     General: No focal deficit present.     Mental Status: He is alert and oriented to person, place, and time.  Psychiatric:        Mood and Affect: Mood normal.        Behavior: Behavior  normal.     Pertinent labs:  Lab Results  Component Value Date   TSH 1.20 04/03/2020   Lab Results  Component Value Date   WBC 4.1 04/03/2020   HGB 16.1 04/03/2020   HCT 47.1 04/03/2020   MCV 94.9 04/03/2020   PLT 146.0 (L) 04/03/2020   Lab Results  Component Value Date   CREATININE 1.32 (H) 09/16/2021   BUN 19 09/16/2021   NA 136 09/16/2021   K 4.4 09/16/2021   CL 100 09/16/2021   CO2 24 09/16/2021   Lab Results  Component Value Date   ALT 67 (H) 09/16/2021   AST 52 (H) 09/16/2021   ALKPHOS 67 09/16/2021   BILITOT 0.4 09/16/2021   Lab Results  Component Value Date   CHOL 147 09/16/2021   Lab Results  Component Value Date   HDL 31 (L) 09/16/2021   Lab Results  Component Value Date   LDLCALC 89 09/16/2021   Lab Results  Component Value Date   TRIG 153 (H) 09/16/2021   Lab Results  Component Value Date   CHOLHDL 4.7 09/16/2021   Lab Results  Component Value Date   PSA 0.56 04/03/2020   PSA 0.64 02/07/2019   PSA 0.68 12/08/2016   Lab Results  Component Value Date   HGBA1C 6.2 01/16/2021   ASSESSMENT AND PLAN:  Non-toxic, stable male who presents today for med refill and follow up for HTN, HLD, and Prediabetes.  #1 hypertension: Stable (recheck today 120/80). Will get cmp and Cbc. Last electrolytes check by cardiology showed mildly elevated glucose (105), Creatinine (1.31), and liver enzymes (AST-52, ALT-67). No medication changes.   2.  Hyperlipidemia - controlled. LDL was 89 earlier this month at cardiologist's.  He was on rosuva 40mg  at that time.  AST and ALT were a little up so his dose was cut back to 20mg .   He has f/u plan for lipid monitoring with cardiology.  3.  Prediabetes-6.2 A1c (01/16/21). Patient is currently on a 3 month trial to control A1c with diet and exercise. Checking A1c today  4 elevated transaminase: About 3 weeks ago his ALT was 67 and his AST was 52.  Obtain hepatic panel today to see if any trend up.  5. ED,  suboptimal response to viagra. Patient asked to switch to Cialis 10mg  PRN--rx'd today.  6. Vaccines: Prevnar 20->will receive Friday 10/10/20.  Otherwise UTD. Labs: cbc, PSA, cbc, Hba1c. Prostate ca screening: PSA today Colon ca screening: Recall 2025  7. Dermatitis-per pt report. Triamcinolone 0.1%, apply bid prn---rx'd today. Therapeutic expectations and side effect profile of medication discussed today.  Patient's questions answered.  An After Visit Summary was printed and given to the patient.  FOLLOW UP:  Return in about 6 months (around 04/05/2022) for annual CPE (fasting).  Phil Dopp - MS3   Signed:  Crissie Sickles, MD  10/06/2021 ° °

## 2021-10-07 ENCOUNTER — Other Ambulatory Visit (INDEPENDENT_AMBULATORY_CARE_PROVIDER_SITE_OTHER): Payer: BC Managed Care – PPO

## 2021-10-07 ENCOUNTER — Encounter: Payer: Self-pay | Admitting: Family Medicine

## 2021-10-07 ENCOUNTER — Other Ambulatory Visit: Payer: Self-pay | Admitting: Family Medicine

## 2021-10-07 DIAGNOSIS — D649 Anemia, unspecified: Secondary | ICD-10-CM

## 2021-10-07 LAB — IBC + FERRITIN
Ferritin: 8.2 ng/mL — ABNORMAL LOW (ref 22.0–322.0)
Iron: 43 ug/dL (ref 42–165)
Saturation Ratios: 9.1 % — ABNORMAL LOW (ref 20.0–50.0)
TIBC: 473.2 ug/dL — ABNORMAL HIGH (ref 250.0–450.0)
Transferrin: 338 mg/dL (ref 212.0–360.0)

## 2021-10-07 MED ORDER — PANTOPRAZOLE SODIUM 40 MG PO TBEC: 40.0000 mg | DELAYED_RELEASE_TABLET | Freq: Two times a day (BID) | ORAL | 3 refills | Status: DC

## 2021-10-08 ENCOUNTER — Telehealth: Payer: Self-pay

## 2021-10-08 NOTE — Telephone Encounter (Signed)
Pt returned call. Notified pt that there was a lab added. Pt states you can leave a detailed message if he does not answer. He is in mtgs all day.

## 2021-10-10 ENCOUNTER — Ambulatory Visit: Payer: BC Managed Care – PPO

## 2021-10-13 ENCOUNTER — Ambulatory Visit: Payer: BC Managed Care – PPO

## 2021-10-20 ENCOUNTER — Telehealth (INDEPENDENT_AMBULATORY_CARE_PROVIDER_SITE_OTHER): Payer: BC Managed Care – PPO | Admitting: Internal Medicine

## 2021-10-20 ENCOUNTER — Encounter: Payer: Self-pay | Admitting: Internal Medicine

## 2021-10-20 VITALS — BP 127/86 | HR 71 | Wt 159.4 lb

## 2021-10-20 DIAGNOSIS — I10 Essential (primary) hypertension: Secondary | ICD-10-CM

## 2021-10-20 DIAGNOSIS — E785 Hyperlipidemia, unspecified: Secondary | ICD-10-CM | POA: Diagnosis not present

## 2021-10-20 DIAGNOSIS — I252 Old myocardial infarction: Secondary | ICD-10-CM | POA: Diagnosis not present

## 2021-10-20 DIAGNOSIS — I2510 Atherosclerotic heart disease of native coronary artery without angina pectoris: Secondary | ICD-10-CM | POA: Diagnosis not present

## 2021-10-20 DIAGNOSIS — Z9861 Coronary angioplasty status: Secondary | ICD-10-CM

## 2021-10-20 NOTE — Progress Notes (Signed)
Virtual Visit via Video Note   This visit type was conducted due to national recommendations for restrictions regarding the COVID-19 Pandemic (e.g. social distancing) in an effort to limit this patient's exposure and mitigate transmission in our community.  Due to his co-morbid illnesses, this patient is at least at moderate risk for complications without adequate follow up.  This format is felt to be most appropriate for this patient at this time.  All issues noted in this document were discussed and addressed.  A limited physical exam was performed with this format.  Please refer to the patient's chart for his consent to telehealth for Thomas Spencer.      Date:  10/20/2021   ID:  Thomas Spencer, DOB 09/17/1965, MRN [id number] The patient was identified using 2 identifiers.  Evaluation Performed:  New Patient Evaluation  Patient Location:  [place] [address]  Provider location:   [address] [place], [place] [address]  PCP:  Thomas Sou, MD  Cardiologist:  Thomas Latch, MD Electrophysiologist:  None   Chief Complaint:  Manage dyslipidemia  History of Present Illness:    Thomas Spencer is a 56 y.o. male who presents via audio/video conferencing for a telehealth visit today.  [name] is a pleasant 56 year old male followed by Dr. Oval Spencer and previously Dr. Ellyn Spencer.  He underwent authorization and PCI for chest pain in 2018.  Since then he is done well without any worsening chest pain.  He has been on 20 mg rosuvastatin for some time and at one point his LDL cholesterol was as low as 39 but has been variable.  More recently his LDL cholesterol was 89 and he attributes this mostly to diet and stress related to work.  He is an Quarry manager for intellectual property.  He has met with Dr. Oval Spencer recently discussed possibly increasing his statin or working on lifestyle.  He would prefer to work on lifestyle for another 3 months  before considering medication change or adding additional medicine.  The patient does not have symptoms concerning for COVID-19 infection (fever, chills, cough, or new SHORTNESS OF BREATH).    Prior CV studies:   The following studies were reviewed today:  Review, lab work  PMHx:  Past Medical History:  Diagnosis Date   Acute epiglottitis 01/2014   BPH with obstruction/lower urinary tract symptoms    [facility] referral done 01/[facility] (Dr. Risa Spencer); ultimately a prostate biopsy was done for increased PSA velocity--April 2013--and it  was completely normal.   CAD S/P percutaneous coronary angioplasty 05/05/2017   Progressive Angina: (3 V CAD on Cor Calcium Score) --> LEFT HEART CATH & CORONARY ANGIOGRAPHY  / CORONARY STENT INTERVENTION Conclusion   LESION #1: p-mLAD to Mid LAD 80 %s --> PCI SYNERGY DES 2.75X16 - Tapered post-dilation: 3.1-2.9 mm.  LESION #2: pRI 85 % - PCI SYNERGY DES 3X12.  Initial plan ASA/Plavix x 1 yr  --> ASA stopped 06/2017 for GI Bleed-- on Plavix alone and continued w/this 11/2017   Elevated coronary artery calcium score 03/2017   indicating possible 3 vessel CAD: cardiac cath per Dr. Ellyn Spencer 8/22 revealed 2 Vessel obstructive CAD in LAD & RI    Elevated liver function tests    mild   Epididymal cyst 09/2019   Right (vs spermatocele.  Hematocele less likely since no hx of trauma).   Erectile dysfunction    Family history of colon cancer    father   Family  hypertension   ° strong family history  ° Family history of premature coronary heart disease   ° strong family history; Mother (MI in 40s, then 50s, s/p Valve Sgx in 70s) & oldest Brother (CABG x 4 in 60s)  ° History of adenomatous polyp of colon 2007; 2014; 08/2019  ° Thomas Spencer did 2020 scope.  Recall 2025.  ° History of sciatica 11/2013  ° responded well to prednisone burst 11/2013  ° Hyperlipidemia   ° On 40 mg Crestor as of 06/2017  ° Hypertension   ° On telmisartan  ° Iron deficiency anemia 06/2017   ° Hb drop from 15 down to 11 from 04/2017-06/2017.  +H pylori gastritis+.  Recurrence 01/2019 +correlation with melena and fatigue.  Pt has been back on iron x 2 wks and is making arrangements to see his GI MD as of 02/08/19 (? repeat EGD + is overdue for repeat colonoscopy).  ° Prediabetes 11/2016  ° HbA1c 6.1% .  up to 6.5% Jan 2023  ° ° °Past Surgical History:  °Procedure Laterality Date  ° CARDIOVASCULAR STRESS TEST  11/16/2019  ° Normal EF, no ischemia  ° COLONOSCOPY W/ POLYPECTOMY  09/02/06; 07/2013; 08/2019  ° 2014 [facility]: adenomatous polyp w/out high grade dysplasia or malignancy.  08/2019 adenoma x 2, diverticulosis, recall 5 yrs.  ° coronary calcium score    ° Very high risk: as of 03/2017 pt is to return to discuss with Dr. Harding: stress test vs cath as next step.  ° CORONARY STENT INTERVENTION N/A 05/05/2017  ° DES x 2. Preserved LV function.  Procedure: CORONARY STENT INTERVENTION;  Surgeon: Thomas Spencer, Thomas W, MD;  Location: [facility];  Service: Cardiovascular;: CAD S/P PCI: p-mLAD Synergy DES 2.75 x 16 (3.1-2.9 mm), p-mRI Synergy DES 3.0 x 12 (3.3 mm)  ° dental implants    ° ESOPHAGOGASTRODUODENOSCOPY  07/08/2017  ° GI RECOMMENDED D/C ASPIRIN:  superfic non-bleeding ulcers ranging 5-8mm in size in antrum.  +H pylori +.  A few non-bleeding erosions in pre-pyloric region.  Normal duodenum.  ° LEFT HEART CATH AND CORONARY ANGIOGRAPHY N/A 05/05/2017  ° Procedure: LEFT HEART CATH AND CORONARY ANGIOGRAPHY;  Surgeon: Thomas Spencer, Thomas W, MD;  Location: [facility];  Service: Cardiovascular;  Laterality: N/A; p-m LAD 80%, mRI 90% --> 2 vessel PCI  ° NM MYOVIEW LTD  11/16/2019  ° Fixed basal inferoseptal and mid anteroseptal perfusion defect consistent with subendocardial scar, NO ISCHEMIA.  EF 52%.  Unable to interpret EKG.  LOW Risk.  ° PROSTATE BIOPSY  01/2012  ° Normal  ° ° °FAMHx:  °Family History  °Problem Relation Age of Onset  ° Heart attack Mother 40  ° Heart disease Mother 50  °  Hyperlipidemia Mother   ° Hypertension Mother   ° Valvular heart disease Mother 70  °     s/p MVR-CABG  ° Cancer Father   °     colon  ° Cancer Sister   °     cervical/ remission  ° Diabetes Sister   °     type 2  ° Hypertension Sister   ° Hypertension Sister   ° Diabetes Sister   °     type 2  ° Hyperlipidemia Brother   ° Hypertension Brother   ° Coronary artery disease Brother 60  °     CABG x 4  ° CAD Brother   ° ° °SOCHx:  ° reports that he has never smoked. He has   He has never used smokeless tobacco. He reports current alcohol use of about 2.0 standard drinks per week. He reports that he does not use drugs.  ALLERGIES:  Allergies  Allergen Reactions   Ace Inhibitors Cough   Shrimp [Shellfish Allergy] Other (See Comments)    Swelling around eyes/lips---can eat IF cooked WELL   Thiazide-Type Diuretics     FATIGUE; DIZZINESS.      MEDS:  Current Meds  Medication Sig   clopidogrel (PLAVIX) 75 MG tablet Take 1 tablet (75 mg total) by mouth daily with breakfast. OFFICE VISIT NEEDED FOR FURTHER REFILLS   Multiple Vitamin (MULTIVITAMIN WITH MINERALS) TABS tablet Take 1 tablet by mouth daily.   pantoprazole (PROTONIX) 40 MG tablet Take 1 tablet (40 mg total) by mouth 2 (two) times daily.   rosuvastatin (CRESTOR) 20 MG tablet Take 1 tablet (20 mg total) by mouth daily.   tadalafil (CIALIS) 10 MG tablet 1-2 tabs po qd prn   telmisartan (MICARDIS) 40 MG tablet Take 1 tablet (40 mg total) by mouth daily.   triamcinolone cream (KENALOG) 0.1 % Apply to affected area bid prn     ROS: Pertinent items noted in HPI and remainder of comprehensive ROS otherwise negative.  Labs/Other Tests and Data Reviewed:    Recent Labs: 10/06/2021: ALT 58; BUN 22; Creatinine, Ser 1.44; Hemoglobin 12.9; Platelets 164.0; Potassium 4.2; Sodium 139   Recent Lipid Panel Lab Results  Component Value Date/Time   CHOL 147 09/16/2021 09:19 AM   TRIG 153 (H) 09/16/2021 09:19 AM   HDL 31 (L) 09/16/2021 09:19 AM   CHOLHDL  4.7 09/16/2021 09:19 AM   CHOLHDL 4 01/16/2021 11:36 AM   LDLCALC 89 09/16/2021 09:19 AM   LDLDIRECT 72.0 12/08/2016 09:43 AM    Wt Readings from Last 3 Encounters:  10/20/21 159 lb 6.4 oz (72.3 kg)  10/06/21 165 lb 6.4 oz (75 kg)  09/26/21 167 lb 9.6 oz (76 kg)     Exam:    Vital Signs:  BP 127/86    Pulse 71    Wt 159 lb 6.4 oz (72.3 kg)    BMI 27.36 kg/m    General appearance: alert and no distress Lungs: no visual respiratory difficulty Abdomen: soft, non-tender; bowel sounds normal; no masses,  no organomegaly Extremities: extremities normal, atraumatic, no cyanosis or edema Neurologic: Grossly normal  ASSESSMENT & PLAN:    Mixed dyslipidemia, goal LDL less than 55 (very high risk) CAD status post two-vessel PCI 2018 History of MI Hypertension Family history of premature coronary disease  Mr. Ratterree has a mixed dyslipidemia but remains above target LDL less than 55, based on new guidelines he would be considered very high risk having had prior MI and revascularization.  His cholesterol has been quite low as low as 39 for LDL 2 years ago and 66 - 9 months ago but more recently had risen up to 89.  He suspects this is related to diet and stress at work.  He thinks he can get his cholesterol lower with aggressive diet.  He is on rosuvastatin 20 mg daily.  We will plan repeat lipid NMR in 3 months and follow-up with me at that time.  If he is not at target would recommend adding ezetimibe 10 mg daily.  Thanks for the kind referral.  COVID-19 Education: The signs and symptoms of COVID-19 were discussed with the patient and how to seek care for testing (follow up with PCP or arrange E-visit).  The importance of social  discussed today. ° °Patient Risk:   °After full review of this patients clinical status, I feel that they are at least moderate risk at this time. ° °Time:   °Today, I have spent 25 minutes with the patient with telehealth technology discussing  dyslipidemia.   ° ° °Medication Adjustments/Labs and Tests Ordered: °Current medicines are reviewed at length with the patient today.  Concerns regarding medicines are outlined above.  ° °Tests Ordered: °No orders of the defined types were placed in this encounter. ° ° °Medication Changes: °No orders of the defined types were placed in this encounter. ° ° °Disposition:  in 3 month(s) ° °Kenneth C. Hilty, MD, FACC, FACP  °[place]   [facility]  °Medical Director of the [facility] &  °Cardiovascular Risk Reduction Clinic °Diplomate of the American Board of Clinical Lipidology °Attending Cardiologist  °Direct Dial: [phone]   Fax: [phone]  °Website:  [email or web address] ° °Kenneth C Hilty, MD  °10/20/2021 9:04 AM    ° ° °

## 2021-10-20 NOTE — Patient Instructions (Signed)
Medication Instructions:  CONTINUE current medications  *If you need a refill on your cardiac medications before your next appointment, please call your pharmacy*   Lab Work: FASTING lab work to check cholesterol in 3-4 months -- complete about 1 week before your next visit with Dr. Debara Pickett   If you have labs (blood work) drawn today and your tests are completely normal, you will receive your results only by: [place] (if you have MyChart) OR A paper copy in the mail If you have any lab test that is abnormal or we need to change your treatment, we will call you to review the results.   Testing/Procedures: NONE   Follow-Up: At [facility], you and your health needs are our priority.  As part of our continuing mission to provide you with exceptional heart care, we have created designated Provider Care Teams.  These Care Teams include your primary Cardiologist (physician) and Advanced Practice Providers (APPs -  Physician Assistants and Nurse Practitioners) who all work together to provide you with the care you need, when you need it.  We recommend signing up for the patient portal called "MyChart".  Sign up information is provided on this After Visit Summary.  MyChart is used to connect with patients for Virtual Visits (Telemedicine).  Patients are able to view lab/test results, encounter notes, upcoming appointments, etc.  Non-urgent messages can be sent to your provider as well.   To learn more about what you can do with MyChart, go to [email or web address].    Your next appointment:   3-4 months with Dr. Debara Pickett   Please work on dietary modifications and exercise

## 2021-10-20 NOTE — Addendum Note (Signed)
Addended by: Fidel Levy on: 10/20/2021 09:28 AM   Modules accepted: Orders

## 2021-10-24 ENCOUNTER — Ambulatory Visit (INDEPENDENT_AMBULATORY_CARE_PROVIDER_SITE_OTHER): Payer: BC Managed Care – PPO

## 2021-10-24 ENCOUNTER — Other Ambulatory Visit: Payer: Self-pay

## 2021-10-24 DIAGNOSIS — Z23 Encounter for immunization: Secondary | ICD-10-CM | POA: Diagnosis not present

## 2021-11-12 ENCOUNTER — Telehealth (HOSPITAL_BASED_OUTPATIENT_CLINIC_OR_DEPARTMENT_OTHER): Payer: Self-pay | Admitting: Cardiovascular Disease

## 2021-11-12 NOTE — Telephone Encounter (Signed)
Patient states he should hear from is employer this week and will call us back--10/13/21 pt is waiting to get approval of payment from his company before he rescheduled kf 10/08/21 lm for patient to call and discuss scheduling kf 09/26/21 patient will call back to schedule kf ?

## 2021-12-06 ENCOUNTER — Other Ambulatory Visit: Payer: Self-pay | Admitting: Family Medicine

## 2021-12-20 ENCOUNTER — Other Ambulatory Visit: Payer: Self-pay | Admitting: Family Medicine

## 2021-12-26 ENCOUNTER — Encounter (HOSPITAL_BASED_OUTPATIENT_CLINIC_OR_DEPARTMENT_OTHER): Payer: Self-pay | Admitting: Cardiovascular Disease

## 2021-12-26 DIAGNOSIS — I10 Essential (primary) hypertension: Secondary | ICD-10-CM

## 2021-12-26 NOTE — Telephone Encounter (Signed)
Okay to order fasting lipid panel, CMET prior to appt per TR last note ? ?Loel Dubonnet, NP  ?

## 2021-12-30 DIAGNOSIS — Z9861 Coronary angioplasty status: Secondary | ICD-10-CM | POA: Diagnosis not present

## 2021-12-30 DIAGNOSIS — I2510 Atherosclerotic heart disease of native coronary artery without angina pectoris: Secondary | ICD-10-CM | POA: Diagnosis not present

## 2021-12-30 DIAGNOSIS — E785 Hyperlipidemia, unspecified: Secondary | ICD-10-CM | POA: Diagnosis not present

## 2021-12-30 NOTE — Progress Notes (Incomplete)
? ?Cardiology Office Note ? ?Date:  12/30/2021  ? ?ID:  Thomas Spencer, DOB 1966/07/11, MRN [id number] ? ?PCP:  Tammi Sou, MD  ?Cardiologist:   Madelin Rear  ? ?No chief complaint on file. ? ?History of Present Illness: ?Thomas Spencer is a 56 y.o. male with CAD status post PCI, hypertension, and hyperlipidemia who presents for follow-up.  He previously had a cardiac catheterization 04/2017 and was found to have severe two-vessel disease in the mid LAD and proximal ramus intermedius.  Both were treated with drug-eluting stents.  His last stress test 11/2019 was low risk.  He previously followed with Dr. Ellyn Hack but chose to transition to the [place] office due to proximity to his home.  He saw Laurann Montana, NP, on 07/2021 and was doing well.  He had repeat lab work that showed improvement in his lipids.   ? ?At his last appointment he was doing well. He messaged the office 12/26/21 and requested to complete repeat lab work prior to his appointment today. ?Overall he has felt well.  He has no chest pain or shortness of breath.  He exercises three days per week for about 45 minutes.  He and his wife eat out a couple days per week.  He notes that there is probably some room for improvement in his diet.  He does try to limit sodium intake and does not drink alcohol excessively.  He grew up in [place], [place] and works as an Clinical cytogeneticist. ? ?Today, *** ? ?Past Medical History:  ?Diagnosis Date  ? Acute epiglottitis 01/2014  ? BPH with obstruction/lower urinary tract symptoms   ? [facility] referral done 01/2011 (Dr. Risa Grill); ultimately a prostate biopsy was done for increased PSA velocity--April 2013--and it  was completely normal.  ? CAD S/P percutaneous coronary angioplasty 05/05/2017  ? Progressive Angina: (3 V CAD on Cor Calcium Score) --> LEFT HEART CATH & CORONARY ANGIOGRAPHY  / CORONARY STENT INTERVENTION Conclusion   LESION #1: p-mLAD to Mid LAD 80 %s --> PCI SYNERGY DES 2.75X16 - Tapered post-dilation:  3.1-2.9 mm.  LESION #2: pRI 85 % - PCI SYNERGY DES 3X12.  Initial plan ASA/Plavix x 1 yr  --> ASA stopped 06/2017 for GI Bleed-- on Plavix alone and continued w/this 11/2017  ? Elevated coronary artery calcium score 03/2017  ? indicating possible 3 vessel CAD: cardiac cath per Dr. Ellyn Hack 8/22 revealed 2 Vessel obstructive CAD in LAD & RI   ? Elevated liver function tests   ? mild  ? Epididymal cyst 09/2019  ? Right (vs spermatocele.  Hematocele less likely since no hx of trauma).  ? Erectile dysfunction   ? Family history of colon cancer   ? father  ? Family history of hypertension   ? strong family history  ? Family history of premature coronary heart disease   ? strong family history; Mother (MI in 91s, then 62s, s/p Valve Sgx in 59s) & oldest Brother (CABG x 4 in 70s)  ? History of adenomatous polyp of colon 2007; 2014; 08/2019  ? Dr. Henrene Pastor did 2020 scope.  Recall 2025.  ? History of sciatica 11/2013  ? responded well to prednisone burst 11/2013  ? Hyperlipidemia   ? On 40 mg Crestor as of 06/2017  ? Hypertension   ? On telmisartan  ? Iron deficiency anemia 06/2017  ? Hb drop from 15 down to 11 from 04/2017-06/2017.  +H pylori gastritis+.  Recurrence 01/2019 +correlation with melena and fatigue.  Pt has  been back on iron x 2 wks and is making arrangements to see his GI MD as of 02/08/19 (? repeat EGD + is overdue for repeat colonoscopy).  ? Prediabetes 11/2016  ? HbA1c 6.1% .  up to 6.5% Jan 2023  ? ? ?Past Surgical History:  ?Procedure Laterality Date  ? CARDIOVASCULAR STRESS TEST  11/16/2019  ? Normal EF, no ischemia  ? COLONOSCOPY W/ POLYPECTOMY  09/02/06; 07/2013; 08/2019  ? 2014 [facility]: adenomatous polyp w/out high grade dysplasia or malignancy.  08/2019 adenoma x 2, diverticulosis, recall 5 yrs.  ? coronary calcium score    ? Very high risk: as of 03/2017 pt is to return to discuss with Dr. Ellyn Hack: stress test vs cath as next step.  ? CORONARY STENT INTERVENTION N/A 05/05/2017  ? DES x 2. Preserved LV  function.  Procedure: CORONARY STENT INTERVENTION;  Surgeon: Leonie Man, MD;  Location: [facility];  Service: Cardiovascular;: CAD S/P PCI: p-mLAD Synergy DES 2.75 x 16 (3.1-2.9 mm), p-mRI Synergy DES 3.0 x 12 (3.3 mm)  ? dental implants    ? ESOPHAGOGASTRODUODENOSCOPY  07/08/2017  ? GI RECOMMENDED D/C ASPIRIN:  superfic non-bleeding ulcers ranging 5-53m in size in antrum.  +H pylori +.  A few non-bleeding erosions in pre-pyloric region.  Normal duodenum.  ? LEFT HEART CATH AND CORONARY ANGIOGRAPHY N/A 05/05/2017  ? Procedure: LEFT HEART CATH AND CORONARY ANGIOGRAPHY;  Surgeon: HLeonie Man MD;  [facility];  Service: Cardiovascular;  Laterality: N/A; p-m LAD 80%, mRI 90% --> 2 vessel PCI  ? NM MYOVIEW LTD  11/16/2019  ? Fixed basal inferoseptal and mid anteroseptal perfusion defect consistent with subendocardial scar, NO ISCHEMIA.  EF 52%.  Unable to interpret EKG.  LOW Risk.  ? PROSTATE BIOPSY  01/2012  ? Normal  ? ? ? ?Current Outpatient Medications  ?Medication Sig Dispense Refill  ? clopidogrel (PLAVIX) 75 MG tablet TAKE 1 TABLET BY MOUTH DAILY WITH BREAKFAST. OFFICE VISIT NEEDED FOR FURTHER REFILLS 90 tablet 0  ? Multiple Vitamin (MULTIVITAMIN WITH MINERALS) TABS tablet Take 1 tablet by mouth daily.    ? pantoprazole (PROTONIX) 40 MG tablet Take 1 tablet (40 mg total) by mouth 2 (two) times daily. 180 tablet 3  ? rosuvastatin (CRESTOR) 20 MG tablet Take 1 tablet (20 mg total) by mouth daily. 90 tablet 3  ? tadalafil (CIALIS) 10 MG tablet 1-2 tabs po qd prn 30 tablet 6  ? telmisartan (MICARDIS) 40 MG tablet Take 1 tablet (40 mg total) by mouth daily. 90 tablet 1  ? triamcinolone cream (KENALOG) 0.1 % Apply to affected area bid prn 30 g 3  ? ?No current facility-administered medications for this visit.  ? ? ?Allergies:   Ace inhibitors, Shrimp [shellfish allergy], and Thiazide-type diuretics  ? ? ?Social History:  The patient  reports that he has never smoked. He has never used  smokeless tobacco. He reports current alcohol use of about 2.0 standard drinks per week. He reports that he does not use drugs.  ? ?Family History:  The patient's family history includes CAD in his brother; Cancer in his father and sister; Coronary artery disease (age of onset: 621 in his brother; Diabetes in his sister and sister; Heart attack (age of onset: 475 in his mother; Heart disease (age of onset: 529 in his mother; Hyperlipidemia in his brother and mother; Hypertension in his brother, mother, sister, and sister; Valvular heart disease (age of onset: 714 in his  mother.  ? ? ?ROS:  ?Please see the history of present illness. ? ?All other systems are reviewed and negative.  ? ? ?PHYSICAL EXAM: ?VS:  There were no vitals taken for this visit. , BMI There is no height or weight on file to calculate BMI. ?GENERAL:  Well appearing ?HEENT:  Pupils equal round and reactive, fundi not visualized, oral mucosa unremarkable ?NECK:  No jugular venous distention, waveform within normal limits, carotid upstroke brisk and symmetric, no bruits, no thyromegaly ?LYMPHATICS:  No cervical adenopath ?LUNGS:  Clear to auscultation bilaterally ?HEART:  RRR.  PMI not displaced or sustained,S1 and S2 within normal limits, no S3, no S4, no clicks, no rubs, no murmurs ?ABD:  Flat, positive bowel sounds normal in frequency in pitch, no bruits, no rebound, no guarding, no midline pulsatile mass, no hepatomegaly, no splenomegaly ?EXT:  2 plus pulses throughout, no edema, no cyanosis no clubbing ?SKIN:  No rashes no nodules ?NEURO:  Cranial nerves II through XII grossly intact, motor grossly intact throughout ?PSYCH:  Cognitively intact, oriented to person place and time ? ? ?EKG:  EKG is personally reviewed. ?01/01/2022: Sinus ***. Rate *** bpm. ? ? ?Nuclear stress 11/2019: ?EKG nondiagnostic due to baseline ST Changes ?Myovue scan with probable normal perfusion, cannot exclude small region of subendocardial scar (anteroseptal, mid). No  significant ischemia ?LVEF 52% ?Low risk scan ?  ?LHC 04/2017 ?LESION #1: Prox LAD to Mid LAD lesion, 80 %stenosed. ?A STENT SYNERGY DES [id number] drug eluting stent was successfully placed. - Postdilated and taper

## 2021-12-31 ENCOUNTER — Encounter (HOSPITAL_BASED_OUTPATIENT_CLINIC_OR_DEPARTMENT_OTHER): Payer: Self-pay

## 2021-12-31 LAB — COMPREHENSIVE METABOLIC PANEL
ALT: 57 IU/L — ABNORMAL HIGH (ref 0–44)
AST: 46 IU/L — ABNORMAL HIGH (ref 0–40)
Albumin/Globulin Ratio: 1.7 (ref 1.2–2.2)
Albumin: 4.4 g/dL (ref 3.8–4.9)
Alkaline Phosphatase: 81 IU/L (ref 44–121)
BUN/Creatinine Ratio: 10 (ref 9–20)
BUN: 15 mg/dL (ref 6–24)
Bilirubin Total: 0.3 mg/dL (ref 0.0–1.2)
CO2: 22 mmol/L (ref 20–29)
Calcium: 9.4 mg/dL (ref 8.7–10.2)
Chloride: 101 mmol/L (ref 96–106)
Creatinine, Ser: 1.48 mg/dL — ABNORMAL HIGH (ref 0.76–1.27)
Globulin, Total: 2.6 g/dL (ref 1.5–4.5)
Glucose: 90 mg/dL (ref 70–99)
Potassium: 4.7 mmol/L (ref 3.5–5.2)
Sodium: 138 mmol/L (ref 134–144)
Total Protein: 7 g/dL (ref 6.0–8.5)
eGFR: 55 mL/min/{1.73_m2} — ABNORMAL LOW (ref >59)

## 2021-12-31 LAB — LIPID PANEL
Chol/HDL Ratio: 3.8 ratio (ref 0.0–5.0)
Cholesterol, Total: 135 mg/dL (ref 100–199)
HDL: 36 mg/dL — ABNORMAL LOW (ref >39)
LDL Chol Calc (NIH): 78 mg/dL (ref 0–99)
Triglycerides: 117 mg/dL (ref 0–149)
VLDL Cholesterol Cal: 21 mg/dL (ref 5–40)

## 2022-01-01 ENCOUNTER — Ambulatory Visit (HOSPITAL_BASED_OUTPATIENT_CLINIC_OR_DEPARTMENT_OTHER): Payer: BC Managed Care – PPO | Admitting: Cardiovascular Disease

## 2022-01-03 DIAGNOSIS — N390 Urinary tract infection, site not specified: Secondary | ICD-10-CM | POA: Diagnosis not present

## 2022-01-03 DIAGNOSIS — J019 Acute sinusitis, unspecified: Secondary | ICD-10-CM | POA: Diagnosis not present

## 2022-01-16 ENCOUNTER — Ambulatory Visit (INDEPENDENT_AMBULATORY_CARE_PROVIDER_SITE_OTHER): Payer: BC Managed Care – PPO | Admitting: Family Medicine

## 2022-01-16 ENCOUNTER — Encounter: Payer: Self-pay | Admitting: Family Medicine

## 2022-01-16 VITALS — BP 106/71 | HR 85 | Temp 98.3°F | Ht 64.0 in | Wt 156.4 lb

## 2022-01-16 DIAGNOSIS — N401 Enlarged prostate with lower urinary tract symptoms: Secondary | ICD-10-CM

## 2022-01-16 DIAGNOSIS — R300 Dysuria: Secondary | ICD-10-CM

## 2022-01-16 DIAGNOSIS — N3001 Acute cystitis with hematuria: Secondary | ICD-10-CM | POA: Diagnosis not present

## 2022-01-16 DIAGNOSIS — N138 Other obstructive and reflux uropathy: Secondary | ICD-10-CM

## 2022-01-16 LAB — POCT URINALYSIS DIPSTICK
Bilirubin, UA: NEGATIVE
Glucose, UA: NEGATIVE
Ketones, UA: NEGATIVE
Protein, UA: POSITIVE — AB
Spec Grav, UA: 1.015 (ref 1.010–1.025)
Urobilinogen, UA: 0.2 E.U./dL
pH, UA: 6 (ref 5.0–8.0)

## 2022-01-16 MED ORDER — SULFAMETHOXAZOLE-TRIMETHOPRIM 800-160 MG PO TABS: 1.0000 | ORAL_TABLET | Freq: Two times a day (BID) | ORAL | 0 refills | Status: AC

## 2022-01-16 NOTE — Addendum Note (Signed)
Addended by: Deveron Furlong D on: 01/16/2022 12:26 PM ? ? Modules accepted: Orders ? ?

## 2022-01-16 NOTE — Progress Notes (Signed)
OFFICE VISIT ? ?01/16/2022 ? ?CC:  ?Chief Complaint  ?Patient presents with  ? Follow-up  ?  UTI; given 10 d course of Augmentin on 4/22. Pt states it helped but still having burning with urination, low grade fever. Previously it got up to 103  ? ?Patient is a 56 y.o. male who presents for recent dx UTI. ? ?HPI: ?Thomas [place] is having burning with urination, urinary urgency, and urinary frequency. ? ?He noted onset of the same symptoms a couple of weeks ago after he had had a significant upper respiratory illness that wiped him out for a few days.  He had just previously gone on too long business trips and felt physically fatigued from this. ?It was after he started improving from his respiratory illness that he developed his urinary symptoms. ?He went to urgent care in [facility] and was prescribed Augmentin.  No records available for review at this time. ? ?He does have a remote history of a bladder infection with E. coli that was pansensitive back in 2012. ?He describes baseline mild lower urinary tract obstructive symptoms but nothing too bothersome. ?No fevers, flank pain, abdominal pain, nausea, or blood in urine. ? ?Past Medical History:  ?Diagnosis Date  ? Acute epiglottitis 01/2014  ? BPH with obstruction/lower urinary tract symptoms   ? [facility] referral done 01/2011 (Dr. Risa Grill); ultimately a prostate biopsy was done for increased PSA velocity--April 2013--and it  was completely normal.  ? CAD S/P percutaneous coronary angioplasty 05/05/2017  ? Progressive Angina: (3 V CAD on Cor Calcium Score) --> LEFT HEART CATH & CORONARY ANGIOGRAPHY  / CORONARY STENT INTERVENTION Conclusion   LESION #1: p-mLAD to Mid LAD 80 %s --> PCI SYNERGY DES 2.75X16 - Tapered post-dilation: 3.1-2.9 mm.  LESION #2: pRI 85 % - PCI SYNERGY DES 3X12.  Initial plan ASA/Plavix x 1 yr  --> ASA stopped 06/2017 for GI Bleed-- on Plavix alone and continued w/this 11/2017  ? Elevated coronary artery calcium score 03/2017  ? indicating possible  3 vessel CAD: cardiac cath per Dr. Ellyn Hack 8/22 revealed 2 Vessel obstructive CAD in LAD & RI   ? Elevated liver function tests   ? mild  ? Epididymal cyst 09/2019  ? Right (vs spermatocele.  Hematocele less likely since no hx of trauma).  ? Erectile dysfunction   ? Family history of colon cancer   ? father  ? Family history of hypertension   ? strong family history  ? Family history of premature coronary heart disease   ? strong family history; Mother (MI in 25s, then 32s, s/p Valve Sgx in 45s) & oldest Brother (CABG x 4 in 5s)  ? History of adenomatous polyp of colon 2007; 2014; 08/2019  ? Dr. Henrene Pastor did 2020 scope.  Recall 2025.  ? History of sciatica 11/2013  ? responded well to prednisone burst 11/2013  ? Hyperlipidemia   ? On 40 mg Crestor as of 06/2017  ? Hypertension   ? On telmisartan  ? Iron deficiency anemia 06/2017  ? Hb drop from 15 down to 11 from 04/2017-06/2017.  +H pylori gastritis+.  Recurrence 01/2019 +correlation with melena and fatigue.  Pt has been back on iron x 2 wks and is making arrangements to see his GI MD as of 02/08/19 (? repeat EGD + is overdue for repeat colonoscopy).  ? Prediabetes 11/2016  ? HbA1c 6.1% .  up to 6.5% Jan 2023  ? ? ?Past Surgical History:  ?Procedure Laterality Date  ? CARDIOVASCULAR  STRESS TEST  11/16/2019  ? Normal EF, no ischemia  ? COLONOSCOPY W/ POLYPECTOMY  09/02/06; 07/2013; 08/2019  ? 2014 [facility]: adenomatous polyp w/out high grade dysplasia or malignancy.  08/2019 adenoma x 2, diverticulosis, recall 5 yrs.  ? coronary calcium score    ? Very high risk: as of 03/2017 pt is to return to discuss with Dr. Ellyn Hack: stress test vs cath as next step.  ? CORONARY STENT INTERVENTION N/A 05/05/2017  ? DES x 2. Preserved LV function.  Procedure: CORONARY STENT INTERVENTION;  Surgeon: Leonie Man, MD;  Location: [place] [facility];  Service: Cardiovascular;: CAD S/P PCI: p-mLAD Synergy DES 2.75 x 16 (3.1-2.9 mm), p-mRI Synergy DES 3.0 x 12 (3.3 mm)  ? dental implants     ? ESOPHAGOGASTRODUODENOSCOPY  07/08/2017  ? GI RECOMMENDED D/C ASPIRIN:  superfic non-bleeding ulcers ranging 5-49m in size in antrum.  +H pylori +.  A few non-bleeding erosions in pre-pyloric region.  Normal duodenum.  ? LEFT HEART CATH AND CORONARY ANGIOGRAPHY N/A 05/05/2017  ? Procedure: LEFT HEART CATH AND CORONARY ANGIOGRAPHY;  Surgeon: HLeonie Man MD;  [facility];  Service: Cardiovascular;  Laterality: N/A; p-m LAD 80%, mRI 90% --> 2 vessel PCI  ? NM MYOVIEW LTD  11/16/2019  ? Fixed basal inferoseptal and mid anteroseptal perfusion defect consistent with subendocardial scar, NO ISCHEMIA.  EF 52%.  Unable to interpret EKG.  LOW Risk.  ? PROSTATE BIOPSY  01/2012  ? Normal  ? ? ?Outpatient Medications Prior to Visit  ?Medication Sig Dispense Refill  ? clopidogrel (PLAVIX) 75 MG tablet TAKE 1 TABLET BY MOUTH DAILY WITH BREAKFAST. OFFICE VISIT NEEDED FOR FURTHER REFILLS 90 tablet 0  ? Multiple Vitamin (MULTIVITAMIN WITH MINERALS) TABS tablet Take 1 tablet by mouth daily.    ? pantoprazole (PROTONIX) 40 MG tablet Take 1 tablet (40 mg total) by mouth 2 (two) times daily. 180 tablet 3  ? rosuvastatin (CRESTOR) 20 MG tablet Take 1 tablet (20 mg total) by mouth daily. 90 tablet 3  ? tadalafil (CIALIS) 10 MG tablet 1-2 tabs po qd prn 30 tablet 6  ? telmisartan (MICARDIS) 40 MG tablet Take 1 tablet (40 mg total) by mouth daily. 90 tablet 1  ? triamcinolone cream (KENALOG) 0.1 % Apply to affected area bid prn 30 g 3  ? ?No facility-administered medications prior to visit.  ? ? ?Allergies  ?Allergen Reactions  ? Ace Inhibitors Cough  ? Shrimp [Shellfish Allergy] Other (See Comments)  ?  Swelling around eyes/lips---can eat IF cooked WELL  ? Thiazide-Type Diuretics   ?  FATIGUE; DIZZINESS. ? ?  ? ? ?ROS ?As per HPI ? ?PE: ? ?  01/16/2022  ? 10:33 AM 10/20/2021  ?  8:54 AM 10/06/2021  ?  9:37 AM  ?Vitals with BMI  ?Height 5' 4"     ?Weight 156 lbs 6 oz 159 lbs 6 oz   ?BMI 26.83 27.35   ?Systolic [id number]  ?Diastolic 71 86 76  ?Pulse 85 71   ? ? ? ?Physical Exam ? ?Gen: Alert, well appearing.  Patient is oriented to person, place, time, and situation. ?AFFECT: pleasant, lucid thought and speech. ?No further exam today. ? ?LABS:  ?Last metabolic panel ?Lab Results  ?Component Value Date  ? GLUCOSE 90 12/30/2021  ? NA 138 12/30/2021  ? K 4.7 12/30/2021  ? CL 101 12/30/2021  ? CO2 22 12/30/2021  ? BUN 15 12/30/2021  ? CREATININE  1.48 (H) 12/30/2021  ? EGFR 55 (L) 12/30/2021  ? CALCIUM 9.4 12/30/2021  ? PROT 7.0 12/30/2021  ? ALBUMIN 4.4 12/30/2021  ? LABGLOB 2.6 12/30/2021  ? AGRATIO 1.7 12/30/2021  ? BILITOT 0.3 12/30/2021  ? ALKPHOS 81 12/30/2021  ? AST 46 (H) 12/30/2021  ? ALT 57 (H) 12/30/2021  ? ANIONGAP 7 05/06/2017  ? ?Lab Results  ?Component Value Date  ? WBC 3.8 (L) 10/06/2021  ? HGB 12.9 (L) 10/06/2021  ? HCT 40.6 10/06/2021  ? MCV 80.8 10/06/2021  ? PLT 164.0 10/06/2021  ? ?Lab Results  ?Component Value Date  ? IRON 43 10/07/2021  ? TIBC 473.2 (H) 10/07/2021  ? FERRITIN 8.2 (L) 10/07/2021  ? ?POC CC dipstick UA today 1+ blood, nitrite positive, 2+ LE ? ?IMPRESSION AND PLAN: ? ?Acute cystitis, also suspect acute prostatitis.  He has some mild underlying BPH with lower urinary tract obstructive symptoms at baseline. ? ?UA abnormal today. ?Incomplete response to a 10-day course of Augmentin. ?Sent urine for culture and sensitivity today. ?Bactrim double strength, 1 twice daily x14 days prescribed. ? ?An After Visit Summary was printed and given to the patient. ? ?FOLLOW UP: Return if symptoms worsen or fail to improve. ? ?Signed:  Crissie Sickles, MD           01/16/2022 ? ? ?

## 2022-01-19 LAB — URINALYSIS W MICROSCOPIC + REFLEX CULTURE
Bilirubin Urine: NEGATIVE
Glucose, UA: NEGATIVE
Hyaline Cast: NONE SEEN /LPF
Ketones, ur: NEGATIVE
Nitrites, Initial: NEGATIVE
Specific Gravity, Urine: 1.017 (ref 1.001–1.035)
pH: 5.5 (ref 5.0–8.0)

## 2022-01-19 LAB — URINE CULTURE
MICRO NUMBER:: 13361298
SPECIMEN QUALITY:: ADEQUATE

## 2022-01-19 LAB — CULTURE INDICATED

## 2022-01-28 ENCOUNTER — Encounter (HOSPITAL_BASED_OUTPATIENT_CLINIC_OR_DEPARTMENT_OTHER): Payer: Self-pay | Admitting: Cardiovascular Disease

## 2022-01-28 ENCOUNTER — Ambulatory Visit (HOSPITAL_BASED_OUTPATIENT_CLINIC_OR_DEPARTMENT_OTHER): Payer: BC Managed Care – PPO | Admitting: Cardiovascular Disease

## 2022-01-28 VITALS — BP 114/72 | HR 67 | Ht 64.0 in | Wt 157.7 lb

## 2022-01-28 DIAGNOSIS — I10 Essential (primary) hypertension: Secondary | ICD-10-CM | POA: Diagnosis not present

## 2022-01-28 DIAGNOSIS — Z5181 Encounter for therapeutic drug level monitoring: Secondary | ICD-10-CM

## 2022-01-28 DIAGNOSIS — E785 Hyperlipidemia, unspecified: Secondary | ICD-10-CM

## 2022-01-28 MED ORDER — TELMISARTAN 80 MG PO TABS: 80.0000 mg | ORAL_TABLET | Freq: Every day | ORAL | 3 refills | Status: DC

## 2022-01-28 NOTE — Progress Notes (Signed)
? ? ?Cardiology Office Note ? ? ?Date:  01/28/2022  ? ?ID:  Thomas Spencer, DOB 02-15-1966, MRN [id number] ? ?PCP:  Thomas Sou, MD  ?Cardiologist:   Thomas Latch, MD  ? ?No chief complaint on file. ? ? ?History of Present Illness: ?Thomas Spencer is a 56 y.o. male with CAD status post PCI, hypertension, and hyperlipidemia who presents for follow-up.  He previously had a cardiac catheterization 04/2017 and was found to have severe two-vessel disease in the mid LAD and proximal ramus intermedius.  Both were treated with drug-eluting stents.  His last stress test 11/2019 was low risk.  He previously followed with Dr. Ellyn Spencer but chose to transition to the [place] office due to proximity to his home.  He last saw Thomas Montana, NP, on 07/2021 and was doing well.  He and repeat lab work that showed improvement in his lipids.  Overall he has felt well.  He has no chest pain or shortness of breath.  He exercises three days per week for about 45 minutes.  He and his wife eat out a couple days per week.  He notes that there is probably some room for improvement in his diet.  He does try to limit sodium intake and does not drink alcohol excessively.  [place] and works as an Clinical cytogeneticist. ? ?He had repeat lipids that showed that his LDL was still above his target of 55.  He followed up with Dr. Roanna Spencer and they made a plan to continue aggressive diet and exercise.  If lipid NMR remained above goal at follow-up he recommended adding Zetia.  Lately he has been feeling well.  He goes to the gym twice per week and does the treadmill or stairmaster.  He also lifts weights.  He has no exertional chest pain or shortness of breath.  He has noticed an improvement in his endurance.  He exercises for about 45 minutes and he goes to the gym and also does gardening.  He has not had any lower extremity edema, orthopnea, or PND.  His BP at home has been ranging 110s-140s/ upper 70s-80.  It runs lower in the AM and  higher in the evening.  He has been working on diet and limiting sodium.  He notes that he did use ibuprofen recently, but that was after his most recent labs. ? ?Past Medical History:  ?Diagnosis Date  ? Acute epiglottitis 01/2014  ? BPH with obstruction/lower urinary tract symptoms   ? [facility] referral done 01/2011 ([place]); ultimately a prostate biopsy was done for increased PSA velocity--April 2013--and it  was completely normal.  ? CAD S/P percutaneous coronary angioplasty 05/05/2017  ? Progressive Angina: (3 V CAD on Cor Calcium Score) --> LEFT HEART CATH & CORONARY ANGIOGRAPHY  / CORONARY STENT INTERVENTION Conclusion   LESION #1: p-mLAD to Mid LAD 80 %s --> PCI SYNERGY DES 2.75X16 - Tapered post-dilation: 3.1-2.9 mm.  LESION #2: pRI 85 % - PCI SYNERGY DES 3X12.  Initial plan ASA/Plavix x 1 yr  --> ASA stopped 06/2017 for GI Bleed-- on Plavix alone and continued w/this 11/2017  ? Elevated coronary artery calcium score 03/2017  ? indicating possible 3 vessel CAD: cardiac cath per Dr. Ellyn Spencer 8/22 revealed 2 Vessel obstructive CAD in LAD & RI   ? Elevated liver function tests   ? mild  ? Epididymal cyst 09/2019  ? Right (vs spermatocele.  Hematocele less likely since no hx of trauma).  ?  Erectile dysfunction   ? Family history of colon cancer   ? father  ? Family history of hypertension   ? strong family history  ? Family history of premature coronary heart disease   ? strong family history; Mother (MI in 46s, then 35s, s/p Valve Sgx in 87s) & oldest Brother (CABG x 4 in 23s)  ? History of adenomatous polyp of colon 2007; 2014; 08/2019  ? [place] did 2020 scope.  Recall 2025.  ? History of sciatica 11/2013  ? responded well to prednisone burst 11/2013  ? Hyperlipidemia   ? On 40 mg Crestor as of 06/2017  ? Hypertension   ? On telmisartan  ? Iron deficiency anemia 06/2017  ? Hb drop from 15 down to 11 from 04/2017-06/2017.  +H pylori gastritis+.  Recurrence 01/2019 +correlation with melena and fatigue.   Pt has been back on iron x 2 wks and is making arrangements to see his GI MD as of 02/08/19 (? repeat EGD + is overdue for repeat colonoscopy).  ? Prediabetes 11/2016  ? HbA1c 6.1% .  up to 6.5% Jan 2023  ? ? ?Past Surgical History:  ?Procedure Laterality Date  ? CARDIOVASCULAR STRESS TEST  11/16/2019  ? Normal EF, no ischemia  ? COLONOSCOPY W/ POLYPECTOMY  09/02/06; 07/2013; 08/2019  ? 2014 [facility]: adenomatous polyp w/out high grade dysplasia or malignancy.  08/2019 adenoma x 2, diverticulosis, recall 5 yrs.  ? coronary calcium score    ? Very high risk: as of 03/2017 pt is to return to discuss with Dr. Ellyn Spencer: stress test vs cath as next step.  ? CORONARY STENT INTERVENTION N/A 05/05/2017  ? DES x 2. Preserved LV function.  Procedure: CORONARY STENT INTERVENTION;  Surgeon: Thomas Man, MD;  Location: [facility];  Service: Cardiovascular;: CAD S/P PCI: p-mLAD Synergy DES 2.75 x 16 (3.1-2.9 mm), p-mRI Synergy DES 3.0 x 12 (3.3 mm)  ? dental implants    ? ESOPHAGOGASTRODUODENOSCOPY  07/08/2017  ? GI RECOMMENDED D/C ASPIRIN:  superfic non-bleeding ulcers ranging 5-45m in size in antrum.  +H pylori +.  A few non-bleeding erosions in pre-pyloric region.  Normal duodenum.  ? LEFT HEART CATH AND CORONARY ANGIOGRAPHY N/A 05/05/2017  ? Procedure: LEFT HEART CATH AND CORONARY ANGIOGRAPHY;  Surgeon: HLeonie Man MD;  [facility];  Service: Cardiovascular;  Laterality: N/A; p-m LAD 80%, mRI 90% --> 2 vessel PCI  ? NM MYOVIEW LTD  11/16/2019  ? Fixed basal inferoseptal and mid anteroseptal perfusion defect consistent with subendocardial scar, NO ISCHEMIA.  EF 52%.  Unable to interpret EKG.  LOW Risk.  ? PROSTATE BIOPSY  01/2012  ? Normal  ? ? ? ?Current Outpatient Medications  ?Medication Sig Dispense Refill  ? clopidogrel (PLAVIX) 75 MG tablet TAKE 1 TABLET BY MOUTH DAILY WITH BREAKFAST. OFFICE VISIT NEEDED FOR FURTHER REFILLS 90 tablet 0  ? Multiple Vitamin (MULTIVITAMIN WITH MINERALS) TABS  tablet Take 1 tablet by mouth daily.    ? pantoprazole (PROTONIX) 40 MG tablet Take 1 tablet (40 mg total) by mouth 2 (two) times daily. 180 tablet 3  ? rosuvastatin (CRESTOR) 20 MG tablet Take 1 tablet (20 mg total) by mouth daily. 90 tablet 3  ? sulfamethoxazole-trimethoprim (BACTRIM DS) 800-160 MG tablet Take 1 tablet by mouth 2 (two) times daily for 14 days. 28 tablet 0  ? tadalafil (CIALIS) 10 MG tablet 1-2 tabs po qd prn 30 tablet 6  ? triamcinolone cream (KENALOG)  0.1 % Apply to affected area bid prn 30 g 3  ? telmisartan (MICARDIS) 80 MG tablet Take 1 tablet (80 mg total) by mouth daily. 90 tablet 3  ? ?No current facility-administered medications for this visit.  ? ? ?Allergies:   Ace inhibitors, Shrimp [shellfish allergy], and Thiazide-type diuretics  ? ? ?Social History:  The patient  reports that he has never smoked. He has never used smokeless tobacco. He reports current alcohol use of about 2.0 standard drinks per week. He reports that he does not use drugs.  ? ?Family History:  The patient's family history includes CAD in his brother; Cancer in his father and sister; Coronary artery disease (age of onset: 63) in his brother; Diabetes in his sister and sister; Heart attack (age of onset: 68) in his mother; Heart disease (age of onset: 87) in his mother; Hyperlipidemia in his brother and mother; Hypertension in his brother, mother, sister, and sister; Valvular heart disease (age of onset: 63) in his mother.  ? ? ?ROS:  Please see the history of present illness.   Otherwise, review of systems are positive for none.   All other systems are reviewed and negative.  ? ? ?PHYSICAL EXAM: ?VS:  BP 114/72 (BP Location: Left Arm, Patient Position: Sitting, Cuff Size: Normal)   Pulse 67   Ht '5\' 4"'$  (1.626 m)   Wt 157 lb 11.2 oz (71.5 kg)   BMI 27.07 kg/m?  , BMI Body mass index is 27.07 kg/m?. ?GENERAL:  Well appearing ?HEENT: Pupils equal round and reactive, fundi not visualized, oral mucosa  unremarkable ?NECK:  No jugular venous distention, waveform within normal limits, carotid upstroke brisk and symmetric, no bruits, no thyromegaly ?LUNGS:  Clear to auscultation bilaterally ?HEART:  RRR.  PMI not displaced or

## 2022-01-28 NOTE — Patient Instructions (Signed)
Medication Instructions:  ?INCREASE YOUR TELMISARTAN TO 80 MG DAILY  ? ?*If you need a refill on your cardiac medications before your next appointment, please call your pharmacy* ? ?Lab Work: ?BMET IN 1 WEEK  ? ?If you have labs (blood work) drawn today and your tests are completely normal, you will receive your results only by: ?MyChart Message (if you have MyChart) OR ?A paper copy in the mail ?If you have any lab test that is abnormal or we need to change your treatment, we will call you to review the results. ? ?Testing/Procedures: ?NONE ? ?Follow-Up: ?At [facility], you and your health needs are our priority.  As part of our continuing mission to provide you with exceptional heart care, we have created designated Provider Care Teams.  These Care Teams include your primary Cardiologist (physician) and Advanced Practice Providers (APPs -  Physician Assistants and Nurse Practitioners) who all work together to provide you with the care you need, when you need it. ? ?We recommend signing up for the patient portal called "MyChart".  Sign up information is provided on this After Visit Summary.  MyChart is used to connect with patients for Virtual Visits (Telemedicine).  Patients are able to view lab/test results, encounter notes, upcoming appointments, etc.  Non-urgent messages can be sent to your provider as well.   ?To learn more about what you can do with MyChart, go to [email or web address].   ? ?Your next appointment:   ?6 month(s) ? ?The format for your next appointment:   ?In Person ? ?Provider:   ?Skeet Latch, MD ?  ?PHARM D IN 1 MONTH  ? ?Other Instructions ?MONITOR AND LOG YOUR BLOOD PRESSURE DAILY. BRING BOTH YOUR LOG AND MONITOR TO FOLLOW UP IN 1 MONTH  ? ? ?

## 2022-02-23 ENCOUNTER — Other Ambulatory Visit: Payer: Self-pay | Admitting: Family Medicine

## 2022-02-23 NOTE — Telephone Encounter (Signed)
Patient refill request. [facility] He states he needs as soon possible, he is leaving to go out of town this week.

## 2022-02-24 ENCOUNTER — Telehealth: Payer: BC Managed Care – PPO | Admitting: Internal Medicine

## 2022-02-25 DIAGNOSIS — Z5181 Encounter for therapeutic drug level monitoring: Secondary | ICD-10-CM | POA: Diagnosis not present

## 2022-02-25 DIAGNOSIS — I10 Essential (primary) hypertension: Secondary | ICD-10-CM | POA: Diagnosis not present

## 2022-02-26 LAB — BASIC METABOLIC PANEL
BUN/Creatinine Ratio: 15 (ref 9–20)
BUN: 21 mg/dL (ref 6–24)
CO2: 24 mmol/L (ref 20–29)
Calcium: 9.6 mg/dL (ref 8.7–10.2)
Chloride: 101 mmol/L (ref 96–106)
Creatinine, Ser: 1.44 mg/dL — ABNORMAL HIGH (ref 0.76–1.27)
Glucose: 101 mg/dL — ABNORMAL HIGH (ref 70–99)
Potassium: 4.4 mmol/L (ref 3.5–5.2)
Sodium: 139 mmol/L (ref 134–144)
eGFR: 56 mL/min/{1.73_m2} — ABNORMAL LOW (ref >59)

## 2022-03-02 ENCOUNTER — Ambulatory Visit: Payer: BC Managed Care – PPO

## 2022-03-03 ENCOUNTER — Ambulatory Visit: Payer: BC Managed Care – PPO | Admitting: Pharmacist Clinician (PhC)/ Clinical Pharmacy Specialist

## 2022-03-03 DIAGNOSIS — E785 Hyperlipidemia, unspecified: Secondary | ICD-10-CM | POA: Diagnosis not present

## 2022-03-03 NOTE — Patient Instructions (Signed)
Your Results:             Your most recent labs Goal  Total Cholesterol 135 < 200  Triglycerides 117 < 150  HDL (happy/good cholesterol) 36 > 40  LDL (lousy/bad cholesterol 78 < 55   Medication changes:  We will start the process to get Repatha covered by your insurance.  Once the prior authorization is complete, Grandville Silos will call you to let you know and confirm pharmacy information.   You will take one injection every 14 days.    Lab orders:  We want to repeat labs after 2-3 months.  We will send you a lab order to remind you once we get closer to that time.     Thank you for choosing [facility]

## 2022-03-03 NOTE — Progress Notes (Unsigned)
03/04/2022 Shirline Frees 1966-01-05 [id number]   HPI:  [place] is a 56 y.o. male patient of Dr Oval Linsey, who presents today for a lipid clinic evaluation.  See pertinent past medical history below.  He had previously tried to increase rosuvastatin to 40 mg, but this caused aches in his joints, so was able to go back to 20 mg daily without incident.  He is in the office to discuss additional options for cholesterol lowering.    Past Medical History: CAD 2018 DES to midLAD and proximal RI  hypertension Followed by Dr. Oval Linsey - on telmisartan 40   Current Medications: rosuvastatin 20 mg qd  Cholesterol Goals: LDL < 55  Family history:  8 siblings, all but 1 with high cholesterol; mother died from heart disease at 20; father died at 2 cancer; 2 children healthy    Diet: mostly home cooked, some salt with cooking, protein mostly chicken and fish, less beef; plenty of vegetables, (has his own garden)  Exercise:  reasonably consistent; goes to gym regularly  Labs:  4/23:  TC 135, TG 117, HDL 36, LDL 78   Current Outpatient Medications  Medication Sig Dispense Refill   clopidogrel (PLAVIX) 75 MG tablet TAKE 1 TABLET BY MOUTH DAILY WITH BREAKFAST. OFFICE VISIT NEEDED FOR FURTHER REFILLS 90 tablet 0   Multiple Vitamin (MULTIVITAMIN WITH MINERALS) TABS tablet Take 1 tablet by mouth daily.     pantoprazole (PROTONIX) 40 MG tablet Take 1 tablet (40 mg total) by mouth 2 (two) times daily. (Patient taking differently: Take 40 mg by mouth daily.) 180 tablet 3   rosuvastatin (CRESTOR) 20 MG tablet Take 1 tablet (20 mg total) by mouth daily. 90 tablet 3   tadalafil (CIALIS) 10 MG tablet 1-2 tabs po qd prn 30 tablet 6   telmisartan (MICARDIS) 80 MG tablet Take 1 tablet (80 mg total) by mouth daily. 90 tablet 3   triamcinolone cream (KENALOG) 0.1 % Apply to affected area bid prn 30 g 3   No current facility-administered medications for this visit.    Allergies  Allergen Reactions   Ace  Inhibitors Cough   Shrimp [Shellfish Allergy] Other (See Comments)    Swelling around eyes/lips---can eat IF cooked WELL   Thiazide-Type Diuretics     FATIGUE; DIZZINESS.      Past Medical History:  Diagnosis Date   Acute epiglottitis 01/2014   BPH with obstruction/lower urinary tract symptoms    [facility] done [facility] (Dr. Risa Grill); ultimately a prostate biopsy was done for increased PSA velocity--April 2013--and it  was completely normal.   CAD S/P percutaneous coronary angioplasty 05/05/2017   Progressive Angina: (3 V CAD on Cor Calcium Score) --> LEFT HEART CATH & CORONARY ANGIOGRAPHY  / CORONARY STENT INTERVENTION Conclusion   LESION #1: p-mLAD to Mid LAD 80 %s --> PCI SYNERGY DES 2.75X16 - Tapered post-dilation: 3.1-2.9 mm.  LESION #2: pRI 85 % - PCI SYNERGY DES 3X12.  Initial plan ASA/Plavix x 1 yr  --> ASA stopped 06/2017 for GI Bleed-- on Plavix alone and continued w/this 11/2017   Elevated coronary artery calcium score 03/2017   indicating possible 3 vessel CAD: cardiac cath per Dr. Ellyn Hack 8/22 revealed 2 Vessel obstructive CAD in LAD & RI    Elevated liver function tests    mild   Epididymal cyst 09/2019   Right (vs spermatocele.  Hematocele less likely since no hx of trauma).   Erectile dysfunction    Family history of colon cancer  father   Family history of hypertension    strong family history   Family history of premature coronary heart disease    strong family history; Mother (MI in 37s, then 24s, s/p Valve Sgx in 39s) & oldest Brother (CABG x 4 in 53s)   History of adenomatous polyp of colon 2007; 2014; 08/2019   Dr. Henrene Pastor did 2020 scope.  Recall 2025.   History of sciatica 11/2013   responded well to prednisone burst 11/2013   Hyperlipidemia    On 40 mg Crestor as of 06/2017   Hypertension    On telmisartan   Iron deficiency anemia 06/2017   Hb drop from 15 down to 11 from 04/2017-06/2017.  +H pylori gastritis+.  Recurrence 01/2019 +correlation  with melena and fatigue.  Pt has been back on iron x 2 wks and is making arrangements to see his GI MD as of 02/08/19 (? repeat EGD + is overdue for repeat colonoscopy).   Prediabetes 11/2016   HbA1c 6.1% .  up to 6.5% Jan 2023    Blood pressure 127/84, pulse 71, height '5\' 4"'$  (1.626 m), weight 150 lb (68 kg).   Hyperlipidemia with target low density lipoprotein (LDL) cholesterol less than 70 mg/dL Patient with ASCVD and prior stent, not at LDL goal on maximally tolerated statin.  Reviewed options for lowering LDL cholesterol, including ezetimibe, PCSK-9 inhibitors, bempedoic acid and inclisiran.  Discussed mechanisms of action, dosing, side effects and potential decreases in LDL cholesterol.  Also reviewed cost information and potential options for patient assistance.  Answered all patient questions.  Based on this information, patient would prefer to start PCSK9 inhibitor.   Will start PA for Repatha 140 mg, which he will start once he gets home from vacation, in early July   Shakayla Hickox PharmD CPP [place] [address] [place], [place] [address] [phone]

## 2022-03-04 ENCOUNTER — Encounter: Payer: Self-pay | Admitting: Pharmacist Clinician (PhC)/ Clinical Pharmacy Specialist

## 2022-03-04 NOTE — Assessment & Plan Note (Signed)
Patient with ASCVD and prior stent, not at LDL goal on maximally tolerated statin.  Reviewed options for lowering LDL cholesterol, including ezetimibe, PCSK-9 inhibitors, bempedoic acid and inclisiran.  Discussed mechanisms of action, dosing, side effects and potential decreases in LDL cholesterol.  Also reviewed cost information and potential options for patient assistance.  Answered all patient questions.  Based on this information, patient would prefer to start PCSK9 inhibitor.   Will start PA for Repatha 140 mg, which he will start once he gets home from vacation, in early July

## 2022-03-11 ENCOUNTER — Encounter: Payer: Self-pay | Admitting: Pharmacist Clinician (PhC)/ Clinical Pharmacy Specialist

## 2022-03-11 MED ORDER — EZETIMIBE 10 MG PO TABS: 10.0000 mg | ORAL_TABLET | Freq: Every day | ORAL | 3 refills | Status: DC

## 2022-04-02 ENCOUNTER — Other Ambulatory Visit: Payer: Self-pay | Admitting: Family Medicine

## 2022-04-02 ENCOUNTER — Other Ambulatory Visit (HOSPITAL_BASED_OUTPATIENT_CLINIC_OR_DEPARTMENT_OTHER): Payer: Self-pay | Admitting: Family

## 2022-04-02 DIAGNOSIS — I10 Essential (primary) hypertension: Secondary | ICD-10-CM

## 2022-05-12 ENCOUNTER — Telehealth: Payer: Self-pay | Admitting: Cardiovascular Disease

## 2022-05-12 DIAGNOSIS — E785 Hyperlipidemia, unspecified: Secondary | ICD-10-CM

## 2022-05-12 DIAGNOSIS — I10 Essential (primary) hypertension: Secondary | ICD-10-CM

## 2022-05-12 DIAGNOSIS — Z5181 Encounter for therapeutic drug level monitoring: Secondary | ICD-10-CM

## 2022-05-12 NOTE — Telephone Encounter (Signed)
Lipid and liver ordered for patient  Spoke with patient and he stated would be no problem getting done at PCP

## 2022-05-12 NOTE — Telephone Encounter (Signed)
Patient called and said that he needs a blood drawn this week while he is at his PCP doctor's appt. Please put in order

## 2022-05-15 ENCOUNTER — Ambulatory Visit (INDEPENDENT_AMBULATORY_CARE_PROVIDER_SITE_OTHER): Payer: BC Managed Care – PPO | Admitting: Family Medicine

## 2022-05-15 ENCOUNTER — Encounter: Payer: Self-pay | Admitting: Family Medicine

## 2022-05-15 VITALS — BP 131/79 | HR 61 | Temp 97.8°F | Ht 64.0 in | Wt 162.4 lb

## 2022-05-15 DIAGNOSIS — M7551 Bursitis of right shoulder: Secondary | ICD-10-CM

## 2022-05-15 DIAGNOSIS — D500 Iron deficiency anemia secondary to blood loss (chronic): Secondary | ICD-10-CM | POA: Diagnosis not present

## 2022-05-15 DIAGNOSIS — M25511 Pain in right shoulder: Secondary | ICD-10-CM | POA: Diagnosis not present

## 2022-05-15 DIAGNOSIS — M7541 Impingement syndrome of right shoulder: Secondary | ICD-10-CM | POA: Diagnosis not present

## 2022-05-15 DIAGNOSIS — E7800 Pure hypercholesterolemia, unspecified: Secondary | ICD-10-CM

## 2022-05-15 LAB — HEPATIC FUNCTION PANEL
ALT: 51 U/L (ref 0–53)
AST: 36 U/L (ref 0–37)
Albumin: 4.2 g/dL (ref 3.5–5.2)
Alkaline Phosphatase: 54 U/L (ref 39–117)
Bilirubin, Direct: 0.1 mg/dL (ref 0.0–0.3)
Total Bilirubin: 0.6 mg/dL (ref 0.2–1.2)
Total Protein: 7.1 g/dL (ref 6.0–8.3)

## 2022-05-15 LAB — LIPID PANEL
Cholesterol: 106 mg/dL (ref 0–200)
HDL: 31.6 mg/dL — ABNORMAL LOW (ref >39.00)
LDL Cholesterol: 52 mg/dL (ref 0–99)
NonHDL: 73.99
Total CHOL/HDL Ratio: 3
Triglycerides: 110 mg/dL (ref 0.0–149.0)
VLDL: 22 mg/dL (ref 0.0–40.0)
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
## 2022-05-25 DIAGNOSIS — M7541 Impingement syndrome of right shoulder: Secondary | ICD-10-CM | POA: Diagnosis not present

## 2022-05-25 DIAGNOSIS — M7551 Bursitis of right shoulder: Secondary | ICD-10-CM | POA: Diagnosis not present

## 2022-05-25 DIAGNOSIS — M25511 Pain in right shoulder: Secondary | ICD-10-CM | POA: Diagnosis not present

## 2022-05-27 ENCOUNTER — Other Ambulatory Visit: Payer: Self-pay

## 2022-05-27 DIAGNOSIS — D500 Iron deficiency anemia secondary to blood loss (chronic): Secondary | ICD-10-CM

## 2022-05-27 DIAGNOSIS — M7541 Impingement syndrome of right shoulder: Secondary | ICD-10-CM | POA: Diagnosis not present

## 2022-05-27 DIAGNOSIS — M7551 Bursitis of right shoulder: Secondary | ICD-10-CM | POA: Diagnosis not present

## 2022-05-27 DIAGNOSIS — M25511 Pain in right shoulder: Secondary | ICD-10-CM | POA: Diagnosis not present

## 2022-05-29 ENCOUNTER — Encounter: Payer: Self-pay | Admitting: Family Medicine

## 2022-05-29 LAB — HEMOCCULT SLIDES (X 3 CARDS)
Fecal Occult Blood: NEGATIVE
OCCULT 1: NEGATIVE
OCCULT 2: NEGATIVE
OCCULT 3: NEGATIVE
OCCULT 4: NEGATIVE
OCCULT 5: NEGATIVE

## 2022-06-01 ENCOUNTER — Telehealth: Payer: Self-pay

## 2022-06-01 DIAGNOSIS — M7541 Impingement syndrome of right shoulder: Secondary | ICD-10-CM | POA: Diagnosis not present

## 2022-06-01 DIAGNOSIS — M25511 Pain in right shoulder: Secondary | ICD-10-CM | POA: Diagnosis not present

## 2022-06-01 DIAGNOSIS — M7551 Bursitis of right shoulder: Secondary | ICD-10-CM | POA: Diagnosis not present

## 2022-06-01 NOTE — Telephone Encounter (Signed)
Patient returning call to office.  I could not find any messages that someone called him today. He stated if this is regards to the hemoccult cards; he is aware; no blood was found in stool. He seen results on mychart.

## 2022-06-04 DIAGNOSIS — M7551 Bursitis of right shoulder: Secondary | ICD-10-CM | POA: Diagnosis not present

## 2022-06-04 DIAGNOSIS — M25511 Pain in right shoulder: Secondary | ICD-10-CM | POA: Diagnosis not present

## 2022-06-04 DIAGNOSIS — M7541 Impingement syndrome of right shoulder: Secondary | ICD-10-CM | POA: Diagnosis not present

## 2022-06-08 DIAGNOSIS — M7541 Impingement syndrome of right shoulder: Secondary | ICD-10-CM | POA: Diagnosis not present

## 2022-06-08 DIAGNOSIS — M25511 Pain in right shoulder: Secondary | ICD-10-CM | POA: Diagnosis not present

## 2022-06-08 DIAGNOSIS — M7551 Bursitis of right shoulder: Secondary | ICD-10-CM | POA: Diagnosis not present

## 2022-06-11 DIAGNOSIS — M7541 Impingement syndrome of right shoulder: Secondary | ICD-10-CM | POA: Diagnosis not present

## 2022-06-11 DIAGNOSIS — M25511 Pain in right shoulder: Secondary | ICD-10-CM | POA: Diagnosis not present

## 2022-06-11 DIAGNOSIS — M7551 Bursitis of right shoulder: Secondary | ICD-10-CM | POA: Diagnosis not present

## 2022-06-12 ENCOUNTER — Other Ambulatory Visit: Payer: Self-pay | Admitting: Family Medicine

## 2022-08-02 ENCOUNTER — Encounter (HOSPITAL_BASED_OUTPATIENT_CLINIC_OR_DEPARTMENT_OTHER): Payer: Self-pay | Admitting: Cardiovascular Disease

## 2022-08-03 ENCOUNTER — Ambulatory Visit (HOSPITAL_BASED_OUTPATIENT_CLINIC_OR_DEPARTMENT_OTHER): Payer: BC Managed Care – PPO | Admitting: Cardiovascular Disease

## 2022-08-03 ENCOUNTER — Encounter (HOSPITAL_BASED_OUTPATIENT_CLINIC_OR_DEPARTMENT_OTHER): Payer: Self-pay | Admitting: Cardiovascular Disease

## 2022-08-03 VITALS — BP 118/76 | HR 67 | Ht 64.0 in | Wt 165.3 lb

## 2022-08-03 DIAGNOSIS — I2510 Atherosclerotic heart disease of native coronary artery without angina pectoris: Secondary | ICD-10-CM | POA: Diagnosis not present

## 2022-08-03 DIAGNOSIS — I10 Essential (primary) hypertension: Secondary | ICD-10-CM | POA: Diagnosis not present

## 2022-08-03 DIAGNOSIS — Z9861 Coronary angioplasty status: Secondary | ICD-10-CM | POA: Diagnosis not present

## 2022-08-03 NOTE — Assessment & Plan Note (Signed)
Lipids are very well controlled.  He is making he changes in his diet and has been pescatarian.  He also exercises regularly.  He is interested in trying to be on fewer medications.  He will continue the rosuvastatin and hold the Zetia.  Repeat lipids and a CMP in 3 months.  His LDL goal is less than 70.

## 2022-08-03 NOTE — Assessment & Plan Note (Signed)
Prior LAD stent.  He is feeling well and exercising.  He has no exertional symptoms.  Continue clopidogrel and rosuvastatin.  No beta blocker 2/2 bradycardia.

## 2022-08-03 NOTE — Assessment & Plan Note (Signed)
Blood pressure is very well controlled on telmisartan.  He is exercising regularly and working on his diet.  No changes at this time.

## 2022-08-03 NOTE — Progress Notes (Signed)
Cardiology Office Note   Date:  08/03/2022   ID:  Thomas Spencer, DOB 1966-01-14, MRN [id number]  PCP:  Tammi Sou, MD  Cardiologist: Skeet Latch, MD    No chief complaint on file.   History of Present Illness: Thomas Spencer is a 56 y.o. male with CAD status post PCI, hypertension, and hyperlipidemia who presents for follow-up.  He previously had a cardiac catheterization 04/2017 and was found to have severe two-vessel disease in the mid LAD and proximal ramus intermedius.  Both were treated with drug-eluting stents.  His last stress test 11/2019 was low risk.  He previously followed with Dr. Ellyn Hack but chose to transition to the [place] office due to proximity to his home.  He last saw Laurann Montana, NP, on 07/2021 and was doing well.  He and repeat lab work that showed improvement in his lipids.  Overall he has felt well.  He has no chest pain or shortness of breath.  He exercises three days per week for about 45 minutes.  He and his wife eat out a couple days per week.  He notes that there is probably some room for improvement in his diet.  He does try to limit sodium intake and does not drink alcohol excessively.  He grew up in [place], [place] and works as an Clinical cytogeneticist.  He had repeat lipids that showed that his LDL was still above his target of 55.  He followed up with Dr. Roanna Epley and they made a plan to continue aggressive diet and exercise.  If lipid NMR remained above goal at follow-up he recommended adding Zetia. His blood pressures were slightly above goal. His Telmisartan was increased. He saw our pharmacist and Repatha was ordered but he still on Zetia and Rosuvastatin.   Today, he noted that he is doing well overall. He denies angina or exertional dyspnea.  He has been consistent with physical activity which includes the stairmaster, walking, and weight training.  He also denies claudication.  He endorses that he feels well overall upon physical activity. He noted that  he began a pescatarian diet and notices improvement in how he feels.  He also thinks that this is the main reason that his cholesterol improved.  Although we did attempt to get him on rosuvastatin his insurance required to the first and Zetia.  He thinks his diet made a bigger impact and then the new medication.  He is interested in trying to be on fewer medicines if possible.   Past Medical History:  Diagnosis Date   Acute epiglottitis 01/2014   BPH with obstruction/lower urinary tract symptoms    [facility] referral done [facility] (Dr. Risa Grill); ultimately a prostate biopsy was done for increased PSA velocity--April 2013--and it  was completely normal.   CAD S/P percutaneous coronary angioplasty 05/05/2017   Progressive Angina: (3 V CAD on Cor Calcium Score) --> LEFT HEART CATH & CORONARY ANGIOGRAPHY  / CORONARY STENT INTERVENTION Conclusion   LESION #1: p-mLAD to Mid LAD 80 %s --> PCI SYNERGY DES 2.75X16 - Tapered post-dilation: 3.1-2.9 mm.  LESION #2: pRI 85 % - PCI SYNERGY DES 3X12.  Initial plan ASA/Plavix x 1 yr  --> ASA stopped 06/2017 for GI Bleed-- on Plavix alone and continued w/this 11/2017   Chronic renal insufficiency, stage 2 (mild)    II/III (GFR 50s-60s) as of 2023   Elevated coronary artery calcium score 03/2017   indicating possible 3 vessel CAD: cardiac cath per Dr. Ellyn Hack 8/22  revealed 2 Vessel obstructive CAD in LAD & RI    Elevated liver function tests    mild   Epididymal cyst 09/2019   Right (vs spermatocele.  Hematocele less likely since no hx of trauma).   Erectile dysfunction    Family history of colon cancer    father   Family history of hypertension    strong family history   Family history of premature coronary heart disease    strong family history; Mother (MI in 29s, then 43s, s/p Valve Sgx in 32s) & oldest Brother (CABG x 4 in 69s)   History of adenomatous polyp of colon 2007; 2014; 08/2019   Dr. Henrene Pastor did 2020 scope.  Recall 2025.   History of sciatica  11/2013   responded well to prednisone burst 11/2013   Hyperlipidemia    On 40 mg Crestor as of 06/2017   Hypertension    On telmisartan   Iron deficiency anemia 06/2017   Hb drop from 15 down to 11 from 04/2017-06/2017.  +H pylori gastritis+.  Recurrence 01/2019 +correlation with melena and fatigue.  Pt has been back on iron x 2 wks and is making arrangements to see his GI MD as of 02/08/19 (? repeat EGD + is overdue for repeat colonoscopy). Hemoccults NEG X 3 Sept 2023.   Prediabetes 11/2016   HbA1c 6.1% .  up to 6.5% Jan 2023    Past Surgical History:  Procedure Laterality Date   CARDIOVASCULAR STRESS TEST  11/16/2019   Normal EF, no ischemia   COLONOSCOPY W/ POLYPECTOMY  09/02/06; 07/2013; 08/2019   2014 [facility]: adenomatous polyp w/out high grade dysplasia or malignancy.  08/2019 adenoma x 2, diverticulosis, recall 5 yrs.   coronary calcium score     Very high risk: as of 03/2017 pt is to return to discuss with Dr. Ellyn Hack: stress test vs cath as next step.   CORONARY STENT INTERVENTION N/A 05/05/2017   DES x 2. Preserved LV function.  Procedure: CORONARY STENT INTERVENTION;  Surgeon: Leonie Man, MD;  Location: [facility];  Service: Cardiovascular;: CAD S/P PCI: p-mLAD Synergy DES 2.75 x 16 (3.1-2.9 mm), p-mRI Synergy DES 3.0 x 12 (3.3 mm)   dental implants     ESOPHAGOGASTRODUODENOSCOPY  07/08/2017   GI RECOMMENDED D/C ASPIRIN:  superfic non-bleeding ulcers ranging 5-81m in size in antrum.  +H pylori +.  A few non-bleeding erosions in pre-pyloric region.  Normal duodenum.   LEFT HEART CATH AND CORONARY ANGIOGRAPHY N/A 05/05/2017   Procedure: LEFT HEART CATH AND CORONARY ANGIOGRAPHY;  Surgeon: HLeonie Man MD;  [facility];  Service: Cardiovascular;  Laterality: N/A; p-m LAD 80%, mRI 90% --> 2 vessel PCI   NM MYOVIEW LTD  11/16/2019   Fixed basal inferoseptal and mid anteroseptal perfusion defect consistent with subendocardial scar, NO ISCHEMIA.  EF 52%.   Unable to interpret EKG.  LOW Risk.   PROSTATE BIOPSY  01/2012   Normal     Current Outpatient Medications  Medication Sig Dispense Refill   clopidogrel (PLAVIX) 75 MG tablet Take 1 tablet (75 mg total) by mouth daily. 90 tablet 1   Multiple Vitamin (MULTIVITAMIN WITH MINERALS) TABS tablet Take 1 tablet by mouth daily.     pantoprazole (PROTONIX) 40 MG tablet Take 1 tablet (40 mg total) by mouth 2 (two) times daily. (Patient taking differently: Take 40 mg by mouth daily.) 180 tablet 3   rosuvastatin (CRESTOR) 20 MG tablet Take 1 tablet (20  mg total) by mouth daily. 90 tablet 3   tadalafil (CIALIS) 10 MG tablet 1-2 tabs po qd prn 30 tablet 6   telmisartan (MICARDIS) 80 MG tablet Take 1 tablet (80 mg total) by mouth daily. 90 tablet 3   triamcinolone cream (KENALOG) 0.1 % Apply to affected area bid prn 30 g 3   No current facility-administered medications for this visit.    Allergies:   Ace inhibitors, Shrimp [shellfish allergy], and Thiazide-type diuretics    Social History:  The patient  reports that he has never smoked. He has never used smokeless tobacco. He reports current alcohol use of about 2.0 standard drinks of alcohol per week. He reports that he does not use drugs.   Family History:  The patient's family history includes CAD in his brother; Cancer in his father and sister; Coronary artery disease (age of onset: 15) in his brother; Diabetes in his sister and sister; Heart attack (age of onset: 25) in his mother; Heart disease (age of onset: 15) in his mother; Hyperlipidemia in his brother and mother; Hypertension in his brother, mother, sister, and sister; Valvular heart disease (age of onset: 34) in his mother.    ROS:  Please see the history of present illness.   Otherwise, review of systems are positive for none.   All other systems are reviewed and negative.    PHYSICAL EXAM: VS:  BP 118/76 (BP Location: Left Arm, Patient Position: Sitting, Cuff Size: Normal)   Pulse 67    Ht '5\' 4"'$  (1.626 m)   Wt 165 lb 4.8 oz (75 kg)   BMI 28.37 kg/m  , BMI Body mass index is 28.37 kg/m. GENERAL:  Well appearing HEENT: Pupils equal round and reactive, fundi not visualized, oral mucosa unremarkable NECK:  No jugular venous distention, waveform within normal limits, carotid upstroke brisk and symmetric, no bruits, no thyromegaly LUNGS:  Clear to auscultation bilaterally HEART:  RRR.  PMI not displaced or sustained,S1 and S2 within normal limits, no S3, no S4, no clicks, no rubs, no murmurs ABD:  Flat, positive bowel sounds normal in frequency in pitch, no bruits, no rebound, no guarding, no midline pulsatile mass, no hepatomegaly, no splenomegaly EXT:  2 plus pulses throughout, no edema, no cyanosis no clubbing SKIN:  No rashes no nodules NEURO:  Cranial nerves II through XII grossly intact, motor grossly intact throughout PSYCH:  Cognitively intact, oriented to person place and time  EKG:  EKG is personally reviewed.  08/03/2022: Sinus rhythm. Rate 67 bpm.  01/28/2022: Sinus rhythm.  Rate 67 bpm.  Nuclear stress 11/2019: EKG nondiagnostic due to baseline ST Changes Myovue scan with probable normal perfusion, cannot exclude small region of subendocardial scar (anteroseptal, mid). No significant ischemia LVEF 52% Low risk scan   [facility] 04/2017 LESION #1: Prox LAD to Mid LAD lesion, 80 %stenosed. A STENT SYNERGY DES [id number] drug eluting stent was successfully placed. - Postdilated and tapered fashion to 3.1-2.9 mm Post intervention, there is a 0% residual stenosis. LESION #2: Ramus lesion, 85 %stenosed. A STENT SYNERGY DES 3X12 drug eluting stent was successfully placed. Postdilated to 3.3 mm Post intervention, there is a 0% residual stenosis. There is hyperdynamic left ventricular systolic function. The left ventricular ejection fraction is greater than 65% by visual estimate. LV end diastolic pressure is normal.   Severe 2 vessel disease involving the mid LAD and  proximal ramus intermedius treated with 2 DES stents. Otherwise mild to moderate disease elsewhere.  Hyperdynamic left ventricle with  normal LVEDP.  Recent Labs: 10/06/2021: Hemoglobin 12.9; Platelets 164.0 02/25/2022: BUN 21; Creatinine, Ser 1.44; Potassium 4.4; Sodium 139 05/15/2022: ALT 51    Lipid Panel    Component Value Date/Time   CHOL 106 05/15/2022 1105   CHOL 135 12/30/2021 1151   TRIG 110.0 05/15/2022 1105   HDL 31.60 (L) 05/15/2022 1105   HDL 36 (L) 12/30/2021 1151   CHOLHDL 3 05/15/2022 1105   VLDL 22.0 05/15/2022 1105   LDLCALC 52 05/15/2022 1105   LDLCALC 78 12/30/2021 1151   LDLDIRECT 72.0 12/08/2016 0943      Wt Readings from Last 3 Encounters:  08/03/22 165 lb 4.8 oz (75 kg)  05/15/22 162 lb 6.4 oz (73.7 kg)  03/03/22 150 lb (68 kg)      ASSESSMENT AND PLAN:  CAD S/P PCI: p-mLAD Synergy DES 2.75 x 16 (3.1-2.9 mm), p-mRI Synergy DES 3.0 x 12 (3.3 mm) Prior LAD stent.  He is feeling well and exercising.  He has no exertional symptoms.  Continue clopidogrel and rosuvastatin.  No beta blocker 2/2 bradycardia.    Essential hypertension Blood pressure is very well controlled on telmisartan.  He is exercising regularly and working on his diet.  No changes at this time.  Hyperlipidemia with target low density lipoprotein (LDL) cholesterol less than 70 mg/dL Lipids are very well controlled.  He is making he changes in his diet and has been pescatarian.  He also exercises regularly.  He is interested in trying to be on fewer medications.  He will continue the rosuvastatin and hold the Zetia.  Repeat lipids and a CMP in 3 months.  His LDL goal is less than 70.    Current medicines are reviewed at length with the patient today.  The patient does not have concerns regarding medicines.  The following changes have been made: Stop Zetia  Labs/ tests ordered today include:   Orders Placed This Encounter  Procedures   Lipid panel   Comprehensive metabolic panel    EKG [id number]    Disposition:   FU with Kamrin Sibley C. Oval Linsey, MD, [facility] in 1-year.    I,Danny Valdes,acting as a Education administrator for [place], MD.,have documented all relevant documentation on the behalf of Skeet Latch, MD,as directed by  Skeet Latch, MD while in the presence of Skeet Latch, MD.   I, Eleele Oval Linsey, MD have reviewed all documentation for this visit.  The documentation of the exam, diagnosis, procedures, and orders on 08/03/2022 are all accurate and complete.   Signed, Yakelin Grenier C. Oval Linsey, MD, [facility]  08/03/2022 8:53 AM    [place]

## 2022-08-03 NOTE — Patient Instructions (Signed)
Medication Instructions:  STOP EZETIMIBE   *If you need a refill on your cardiac medications before your next appointment, please call your pharmacy*  Lab Work: FASTING LP/CMET IN [place]    If you have labs (blood work) drawn today and your tests are completely normal, you will receive your results only by: [place] (if you have MyChart) OR A paper copy in the mail If you have any lab test that is abnormal or we need to change your treatment, we will call you to review the results.  Testing/Procedures: NONE  Follow-Up: At [facility], you and your health needs are our priority.  As part of our continuing mission to provide you with exceptional heart care, we have created designated Provider Care Teams.  These Care Teams include your primary Cardiologist (physician) and Advanced Practice Providers (APPs -  Physician Assistants and Nurse Practitioners) who all work together to provide you with the care you need, when you need it.  We recommend signing up for the patient portal called "MyChart".  Sign up information is provided on this After Visit Summary.  MyChart is used to connect with patients for Virtual Visits (Telemedicine).  Patients are able to view lab/test results, encounter notes, upcoming appointments, etc.  Non-urgent messages can be sent to your provider as well.   To learn more about what you can do with MyChart, go to [email or web address].    Your next appointment:   12 month(s)  The format for your next appointment:   In Person  Provider:   Skeet Latch, MD

## 2022-08-21 ENCOUNTER — Encounter: Payer: Self-pay | Admitting: Family Medicine

## 2022-08-21 ENCOUNTER — Ambulatory Visit (INDEPENDENT_AMBULATORY_CARE_PROVIDER_SITE_OTHER): Payer: BC Managed Care – PPO | Admitting: Family Medicine

## 2022-08-21 VITALS — BP 137/91 | HR 82 | Temp 97.9°F | Ht 64.0 in | Wt 168.6 lb

## 2022-08-21 DIAGNOSIS — R7303 Prediabetes: Secondary | ICD-10-CM

## 2022-08-21 DIAGNOSIS — Z8709 Personal history of other diseases of the respiratory system: Secondary | ICD-10-CM | POA: Diagnosis not present

## 2022-08-21 DIAGNOSIS — R1312 Dysphagia, oropharyngeal phase: Secondary | ICD-10-CM

## 2022-08-21 DIAGNOSIS — D509 Iron deficiency anemia, unspecified: Secondary | ICD-10-CM

## 2022-08-21 DIAGNOSIS — B351 Tinea unguium: Secondary | ICD-10-CM

## 2022-08-21 MED ORDER — SILDENAFIL CITRATE 100 MG PO TABS: 100.0000 mg | ORAL_TABLET | Freq: Every day | ORAL | 5 refills | Status: DC | PRN

## 2022-08-21 NOTE — Progress Notes (Signed)
OFFICE VISIT  08/21/2022  CC:  Chief Complaint  Patient presents with   Toe infection    Has been treating infection at home with otc toenail fungus cream/solution (ProClearz) (1 month use)   Throat concern    Would like throat examined, sometimes food/drink goes down the wrong way. Possible ENT referral   Patient is a 56 y.o. male who presents for left toenail fungus and swallowing concerns.  HPI: #1 patient describes the sensation of abnormal swallowing at times.  Describes a feeling of needing to clear his throat or catch himself from aspirating on a bite of food.  Worse with dry food.  States he does not choke or gag or have difficulty swallowing from a classic dysphagia standpoint.  No throat pain. States this started after he had acute epiglottitis back in 2015.  It occurred quite infrequently but he has noticed it come on more frequently lately, approximately weekly.  He cannot predict when it might happen. He is interested in referral to ENT for repeat visualization of the laryngeal/pharyngeal region.  #2 left great toe nail thickening and turning white. Occurring over the last 9 to 12 months.  Trying topical antifungal over-the-counter, no help. No other toes affected. Occasionally he notes some fungal infection on the skin of his feet.    Past Medical History:  Diagnosis Date   Acute epiglottitis 01/2014   BPH with obstruction/lower urinary tract symptoms    [facility] referral done 01/[facility] (Dr. Risa Grill); ultimately a prostate biopsy was done for increased PSA velocity--April 2013--and it  was completely normal.   CAD S/P percutaneous coronary angioplasty 05/05/2017   Progressive Angina: (3 V CAD on Cor Calcium Score) --> LEFT HEART CATH & CORONARY ANGIOGRAPHY  / CORONARY STENT INTERVENTION Conclusion   LESION #1: p-mLAD to Mid LAD 80 %s --> PCI SYNERGY DES 2.75X16 - Tapered post-dilation: 3.1-2.9 mm.  LESION #2: pRI 85 % - PCI SYNERGY DES 3X12.  Initial plan ASA/Plavix x  1 yr  --> ASA stopped 06/2017 for GI Bleed-- on Plavix alone and continued w/this 11/2017   Chronic renal insufficiency, stage 2 (mild)    II/III (GFR 50s-60s) as of 2023   Elevated coronary artery calcium score 03/2017   indicating possible 3 vessel CAD: cardiac cath per Dr. Ellyn Hack 8/22 revealed 2 Vessel obstructive CAD in LAD & RI    Elevated liver function tests    mild   Epididymal cyst 09/2019   Right (vs spermatocele.  Hematocele less likely since no hx of trauma).   Erectile dysfunction    Family history of colon cancer    father   Family history of hypertension    strong family history   Family history of premature coronary heart disease    strong family history; Mother (MI in 49s, then 64s, s/p Valve Sgx in 78s) & oldest Brother (CABG x 4 in 8s)   History of adenomatous polyp of colon 2007; 2014; 08/2019   Dr. Henrene Pastor did 2020 scope.  Recall 2025.   History of sciatica 11/2013   responded well to prednisone burst 11/2013   Hyperlipidemia    On 40 mg Crestor as of 06/2017   Hypertension    On telmisartan   Iron deficiency anemia 06/2017   Hb drop from 15 down to 11 from 04/2017-06/2017.  +H pylori gastritis+.  Recurrence 01/2019 +correlation with melena and fatigue.  Pt has been back on iron x 2 wks and is making arrangements to see his GI MD as  of 02/08/19 (? repeat EGD + is overdue for repeat colonoscopy). Hemoccults NEG X 3 Sept 2023.   Prediabetes 11/2016   HbA1c 6.1% .  up to 6.5% Jan 2023    Past Surgical History:  Procedure Laterality Date   CARDIOVASCULAR STRESS TEST  11/16/2019   Normal EF, no ischemia   COLONOSCOPY W/ POLYPECTOMY  09/02/06; 07/2013; 08/2019   2014 [facility]: adenomatous polyp w/out high grade dysplasia or malignancy.  08/2019 adenoma x 2, diverticulosis, recall 5 yrs.   coronary calcium score     Very high risk: as of 03/2017 pt is to return to discuss with Dr. Ellyn Hack: stress test vs cath as next step.   CORONARY STENT INTERVENTION N/A 05/05/2017    DES x 2. Preserved LV function.  Procedure: CORONARY STENT INTERVENTION;  Surgeon: Leonie Man, MD;  Location: [facility];  Service: Cardiovascular;: CAD S/P PCI: p-mLAD Synergy DES 2.75 x 16 (3.1-2.9 mm), p-mRI Synergy DES 3.0 x 12 (3.3 mm)   dental implants     ESOPHAGOGASTRODUODENOSCOPY  07/08/2017   GI RECOMMENDED D/C ASPIRIN:  superfic non-bleeding ulcers ranging 5-60m in size in antrum.  +H pylori +.  A few non-bleeding erosions in pre-pyloric region.  Normal duodenum.   LEFT HEART CATH AND CORONARY ANGIOGRAPHY N/A 05/05/2017   Procedure: LEFT HEART CATH AND CORONARY ANGIOGRAPHY;  Surgeon: HLeonie Man MD;  [facility];  Service: Cardiovascular;  Laterality: N/A; p-m LAD 80%, mRI 90% --> 2 vessel PCI   NM MYOVIEW LTD  11/16/2019   Fixed basal inferoseptal and mid anteroseptal perfusion defect consistent with subendocardial scar, NO ISCHEMIA.  EF 52%.  Unable to interpret EKG.  LOW Risk.   PROSTATE BIOPSY  01/2012   Normal    Outpatient Medications Prior to Visit  Medication Sig Dispense Refill   clopidogrel (PLAVIX) 75 MG tablet Take 1 tablet (75 mg total) by mouth daily. 90 tablet 1   Multiple Vitamin (MULTIVITAMIN WITH MINERALS) TABS tablet Take 1 tablet by mouth daily.     pantoprazole (PROTONIX) 40 MG tablet Take 1 tablet (40 mg total) by mouth 2 (two) times daily. (Patient taking differently: Take 40 mg by mouth daily.) 180 tablet 3   rosuvastatin (CRESTOR) 20 MG tablet Take 1 tablet (20 mg total) by mouth daily. 90 tablet 3   tadalafil (CIALIS) 10 MG tablet 1-2 tabs po qd prn 30 tablet 6   telmisartan (MICARDIS) 80 MG tablet Take 1 tablet (80 mg total) by mouth daily. 90 tablet 3   triamcinolone cream (KENALOG) 0.1 % Apply to affected area bid prn 30 g 3   No facility-administered medications prior to visit.    Allergies  Allergen Reactions   Ace Inhibitors Cough   Shrimp [Shellfish Allergy] Other (See Comments)    Swelling around  eyes/lips---can eat IF cooked WELL   Thiazide-Type Diuretics     FATIGUE; DIZZINESS.      ROS As per HPI  PE:    08/21/2022    4:04 PM 08/03/2022    8:20 AM 05/15/2022   10:28 AM  Vitals with BMI  Height _0  _1  _2   Weight 168 lbs 10 oz 165 lbs 5 oz 162 lbs 6 oz  BMI 28.93 210.93223.55 Systolic [id number] Diastolic 91 76 79  Pulse 82 67 61     Physical Exam  Gen: Alert, well appearing.  Patient is oriented to person, place, time, and situation. AFFECT:  pleasant, lucid thought and speech. Left great toe: No erythema, swelling, or tenderness. The nail plate is slightly thickened and discolored and a bit ridged.  White substance under nail. Minimal fragility.  LABS:  Last CBC Lab Results  Component Value Date   WBC 3.8 (L) 10/06/2021   HGB 12.9 (L) 10/06/2021   HCT 40.6 10/06/2021   MCV 80.8 10/06/2021   MCH 31.6 06/22/2017   RDW 17.3 (H) 10/06/2021   PLT 164.0 10/06/2021   Lab Results  Component Value Date   IRON 43 10/07/2021   TIBC 473.2 (H) 10/07/2021   FERRITIN 8.2 (L) 72/62/0355   Last metabolic panel Lab Results  Component Value Date   GLUCOSE 101 (H) 02/25/2022   NA 139 02/25/2022   K 4.4 02/25/2022   CL 101 02/25/2022   CO2 24 02/25/2022   BUN 21 02/25/2022   CREATININE 1.44 (H) 02/25/2022   EGFR 56 (L) 02/25/2022   CALCIUM 9.6 02/25/2022   PROT 7.1 05/15/2022   ALBUMIN 4.2 05/15/2022   LABGLOB 2.6 12/30/2021   AGRATIO 1.7 12/30/2021   BILITOT 0.6 05/15/2022   ALKPHOS 54 05/15/2022   AST 36 05/15/2022   ALT 51 05/15/2022   ANIONGAP 7 05/06/2017   Last hemoglobin A1c Lab Results  Component Value Date   HGBA1C 6.5 10/06/2021   IMPRESSION AND PLAN:  #1 swallowing dysfunction. Remote history of epiglottitis. Will ask ENT to see him.  #2 onychomycosis, left great toe.  Asymptomatic. Discussed options: Lamisil daily x 3 months versus observation/expectant management. Today we decided to simply observe for worsening, no  medication treatment at this time. Signs/symptoms to call or return for were reviewed and pt expressed understanding.  #3 borderline diabetes. Hemoglobin A1c today.  4.  Iron deficiency anemia. Diagnosed with upper GI bleed back in 2018, NSAID-induced plus H. pylori positive. Looking at labs since then it does not look like he ever was able to replenish his iron stores. Most recent Hemoccults earlier this fall negative. Repeat CBC today to check trend.  An After Visit Summary was printed and given to the patient.  FOLLOW UP: Return for as needed.  Signed:  Crissie Sickles, MD           08/21/2022

## 2022-08-24 ENCOUNTER — Other Ambulatory Visit: Payer: Self-pay | Admitting: Family Medicine

## 2022-08-25 ENCOUNTER — Encounter: Payer: Self-pay | Admitting: Family Medicine

## 2022-08-26 LAB — COMPREHENSIVE METABOLIC PANEL
AG Ratio: 1.7 (calc) (ref 1.0–2.5)
ALT: 37 U/L (ref 9–46)
AST: 32 U/L (ref 10–35)
Albumin: 4.5 g/dL (ref 3.6–5.1)
Alkaline phosphatase (APISO): 51 U/L (ref 35–144)
BUN/Creatinine Ratio: 12 (calc) (ref 6–22)
BUN: 19 mg/dL (ref 7–25)
CO2: 25 mmol/L (ref 20–32)
Calcium: 9.6 mg/dL (ref 8.6–10.3)
Chloride: 101 mmol/L (ref 98–110)
Creat: 1.57 mg/dL — ABNORMAL HIGH (ref 0.70–1.30)
Globulin: 2.6 g/dL (calc) (ref 1.9–3.7)
Glucose, Bld: 105 mg/dL — ABNORMAL HIGH (ref 65–99)
Potassium: 4.8 mmol/L (ref 3.5–5.3)
Sodium: 136 mmol/L (ref 135–146)
Total Bilirubin: 0.4 mg/dL (ref 0.2–1.2)
Total Protein: 7.1 g/dL (ref 6.1–8.1)

## 2022-08-26 LAB — HEMOGLOBIN A1C
Hgb A1c MFr Bld: 6.8 % of total Hgb — ABNORMAL HIGH (ref <5.7)
Mean Plasma Glucose: 148 mg/dL
eAG (mmol/L): 8.2 mmol/L

## 2022-08-26 LAB — CBC
HCT: 36.2 % — ABNORMAL LOW (ref 38.5–50.0)
Hemoglobin: 11.6 g/dL — ABNORMAL LOW (ref 13.2–17.1)
MCH: 26.9 pg — ABNORMAL LOW (ref 27.0–33.0)
MCHC: 32 g/dL (ref 32.0–36.0)
MCV: 83.8 fL (ref 80.0–100.0)
MPV: 13.6 fL — ABNORMAL HIGH (ref 7.5–12.5)
Platelets: 178 10*3/uL (ref 140–400)
RBC: 4.32 10*6/uL (ref 4.20–5.80)
RDW: 13.9 % (ref 11.0–15.0)
WBC: 4.3 10*3/uL (ref 3.8–10.8)

## 2022-08-26 LAB — IRON,TIBC AND FERRITIN PANEL
%SAT: 5 % (calc) — ABNORMAL LOW (ref 20–48)
Ferritin: 5 ng/mL — ABNORMAL LOW (ref 38–380)
Iron: 23 ug/dL — ABNORMAL LOW (ref 50–180)
TIBC: 451 mcg/dL (calc) — ABNORMAL HIGH (ref 250–425)

## 2022-09-09 NOTE — Progress Notes (Signed)
This encounter was created in error - please disregard.

## 2022-09-26 ENCOUNTER — Other Ambulatory Visit (HOSPITAL_BASED_OUTPATIENT_CLINIC_OR_DEPARTMENT_OTHER): Payer: Self-pay | Admitting: Family

## 2022-09-26 DIAGNOSIS — E785 Hyperlipidemia, unspecified: Secondary | ICD-10-CM

## 2022-09-28 NOTE — Telephone Encounter (Signed)
Rx request sent to pharmacy.  

## 2022-10-16 ENCOUNTER — Encounter: Payer: Self-pay | Admitting: Family Medicine

## 2022-10-16 ENCOUNTER — Ambulatory Visit: Payer: Managed Care, Other (non HMO) | Admitting: Family Medicine

## 2022-10-16 VITALS — BP 136/80 | HR 76 | Temp 98.8°F | Ht 64.0 in | Wt 164.0 lb

## 2022-10-16 DIAGNOSIS — R351 Nocturia: Secondary | ICD-10-CM | POA: Diagnosis not present

## 2022-10-16 LAB — POC URINALSYSI DIPSTICK (AUTOMATED)
Bilirubin, UA: NEGATIVE
Blood, UA: NEGATIVE
Glucose, UA: NEGATIVE
Ketones, UA: NEGATIVE
Leukocytes, UA: NEGATIVE
Nitrite, UA: NEGATIVE
Protein, UA: POSITIVE — AB
Spec Grav, UA: 1.025 (ref 1.010–1.025)
Urobilinogen, UA: 0.2 E.U./dL
pH, UA: 6 (ref 5.0–8.0)

## 2022-10-16 MED ORDER — SULFAMETHOXAZOLE-TRIMETHOPRIM 800-160 MG PO TABS: 1.0000 | ORAL_TABLET | Freq: Two times a day (BID) | ORAL | 0 refills | Status: DC

## 2022-10-16 MED ORDER — TAMSULOSIN HCL 0.4 MG PO CAPS: 0.4000 mg | ORAL_CAPSULE | Freq: Every day | ORAL | 1 refills | Status: DC

## 2022-10-16 NOTE — Progress Notes (Signed)
OFFICE VISIT  10/16/2022  CC:  Chief Complaint  Patient presents with   Bladder Concern    Still having ongoing bladder issues, would like referral to urologist. Waking up freq to use bathroom 3-4 times a night. C/o burning, mostly cleared up but still occurring. Had night sweats, chills, and fever last week.    Patient is a 57 y.o. male who presents for urinary concerns  HPI: At least the last several days has noted progressive urinary urgency, burning with urination, and frequency.  Subjective fever and malaise a couple days ago.  He chronically has a mildly weak urine stream and sensation of incomplete emptying.  He does have a history of prostatitis and cystitis.  ROS: No abdominal pain, no nausea, no flank pain, no gross hematuria.  Past Medical History:  Diagnosis Date   Acute epiglottitis 01/2014   BPH with obstruction/lower urinary tract symptoms    [facility] referral done 01/[facility] (Dr. Risa Grill); ultimately a prostate biopsy was done for increased PSA velocity--April 2013--and it  was completely normal.   CAD S/P percutaneous coronary angioplasty 05/05/2017   Progressive Angina: (3 V CAD on Cor Calcium Score) --> LEFT HEART CATH & CORONARY ANGIOGRAPHY  / CORONARY STENT INTERVENTION Conclusion   LESION #1: p-mLAD to Mid LAD 80 %s --> PCI SYNERGY DES 2.75X16 - Tapered post-dilation: 3.1-2.9 mm.  LESION #2: pRI 85 % - PCI SYNERGY DES 3X12.  Initial plan ASA/Plavix x 1 yr  --> ASA stopped 06/2017 for GI Bleed-- on Plavix alone and continued w/this 11/2017   Chronic renal insufficiency, stage 3 (moderate) (HCC)    Diabetes mellitus without complication ([place])    U8E 6.8% Dec 2023   Elevated liver function tests    mild   Epididymal cyst 09/2019   Right (vs spermatocele.  Hematocele less likely since no hx of trauma).   Erectile dysfunction    Family history of colon cancer    father   History of adenomatous polyp of colon 2007; 2014; 08/2019   Dr. Henrene Pastor did 2020 scope.   Recall 2025.   History of sciatica 11/2013   responded well to prednisone burst 11/2013   Hyperlipidemia    On 40 mg Crestor as of 06/2017   Hypertension    On telmisartan   Iron deficiency anemia 06/2017   Hb drop from 15 down to 11 from 04/2017-06/2017.  +H pylori gastritis+.  Recurrence 01/2019 +correlation with melena and fatigue.  Pt has been back on iron x 2 wks and is making arrangements to see his GI MD as of 02/08/19 (? repeat EGD + is overdue for repeat colonoscopy). Hemoccults NEG X 3 Sept 2023.    Past Surgical History:  Procedure Laterality Date   CARDIOVASCULAR STRESS TEST  11/16/2019   Normal EF, no ischemia   COLONOSCOPY W/ POLYPECTOMY  09/02/06; 07/2013; 08/2019   2014 [facility]: adenomatous polyp w/out high grade dysplasia or malignancy.  08/2019 adenoma x 2, diverticulosis, recall 5 yrs.   coronary calcium score     Very high risk: as of 03/2017 pt is to return to discuss with Dr. Ellyn Hack: stress test vs cath as next step.   CORONARY STENT INTERVENTION N/A 05/05/2017   DES x 2. Preserved LV function.  Procedure: CORONARY STENT INTERVENTION;  Surgeon: Leonie Man, MD;  Location: [facility];  Service: Cardiovascular;: CAD S/P PCI: p-mLAD Synergy DES 2.75 x 16 (3.1-2.9 mm), p-mRI Synergy DES 3.0 x 12 (3.3 mm)   dental implants  ESOPHAGOGASTRODUODENOSCOPY  07/08/2017   GI RECOMMENDED D/C ASPIRIN:  superfic non-bleeding ulcers ranging 5-84m in size in antrum.  +H pylori +.  A few non-bleeding erosions in pre-pyloric region.  Normal duodenum.   LEFT HEART CATH AND CORONARY ANGIOGRAPHY N/A 05/05/2017   Procedure: LEFT HEART CATH AND CORONARY ANGIOGRAPHY;  Surgeon: HLeonie Man MD;  [facility];  Service: Cardiovascular;  Laterality: N/A; p-m LAD 80%, mRI 90% --> 2 vessel PCI   NM MYOVIEW LTD  11/16/2019   Fixed basal inferoseptal and mid anteroseptal perfusion defect consistent with subendocardial scar, NO ISCHEMIA.  EF 52%.  Unable to interpret  EKG.  LOW Risk.   PROSTATE BIOPSY  01/2012   Normal    Outpatient Medications Prior to Visit  Medication Sig Dispense Refill   clopidogrel (PLAVIX) 75 MG tablet Take 1 tablet (75 mg total) by mouth daily. 90 tablet 1   Multiple Vitamin (MULTIVITAMIN WITH MINERALS) TABS tablet Take 1 tablet by mouth daily.     pantoprazole (PROTONIX) 40 MG tablet Take 1 tablet (40 mg total) by mouth 2 (two) times daily. (Patient taking differently: Take 40 mg by mouth daily.) 180 tablet 3   rosuvastatin (CRESTOR) 20 MG tablet TAKE 1 TABLET BY MOUTH EVERY DAY 90 tablet 1   sildenafil (VIAGRA) 100 MG tablet Take 1 tablet (100 mg total) by mouth daily as needed for erectile dysfunction. 6 tablet 5   telmisartan (MICARDIS) 80 MG tablet Take 1 tablet (80 mg total) by mouth daily. 90 tablet 3   triamcinolone cream (KENALOG) 0.1 % Apply to affected area bid prn 30 g 3   No facility-administered medications prior to visit.    Allergies  Allergen Reactions   Ace Inhibitors Cough   Shrimp [Shellfish Allergy] Other (See Comments)    Swelling around eyes/lips---can eat IF cooked WELL   Thiazide-Type Diuretics     FATIGUE; DIZZINESS.      Review of Systems  As per HPI  PE:    10/16/2022    3:06 PM 10/12/2022   11:42 AM 08/21/2022    4:04 PM  Vitals with BMI  Height '5\' 4"'$   '5\' 4"'$   Weight 164 lbs  168 lbs 10 oz  BMI 266.06 230.16 Systolic [id number] Diastolic 80 91 91  Pulse 76  82     Physical Exam  Gen: Alert, well appearing.  Patient is oriented to person, place, time, and situation. No further exam today  LABS:  Last CBC Lab Results  Component Value Date   WBC 4.3 08/21/2022   HGB 11.6 (L) 08/21/2022   HCT 36.2 (L) 08/21/2022   MCV 83.8 08/21/2022   MCH 26.9 (L) 08/21/2022   RDW 13.9 08/21/2022   PLT 178 08/21/2022   Lab Results  Component Value Date   IRON 23 (L) 08/21/2022   TIBC 451 (H) 08/21/2022   FERRITIN 5 (L) 173/22/0254  Last metabolic panel Lab Results  Component  Value Date   GLUCOSE 105 (H) 08/21/2022   NA 136 08/21/2022   K 4.8 08/21/2022   CL 101 08/21/2022   CO2 25 08/21/2022   BUN 19 08/21/2022   CREATININE 1.57 (H) 08/21/2022   EGFR 56 (L) 02/25/2022   CALCIUM 9.6 08/21/2022   PROT 7.1 08/21/2022   ALBUMIN 4.2 05/15/2022   LABGLOB 2.6 12/30/2021   AGRATIO 1.7 12/30/2021   BILITOT 0.4 08/21/2022   ALKPHOS 54 05/15/2022   AST 32 08/21/2022   ALT  37 08/21/2022   ANIONGAP 7 05/06/2017   IMPRESSION AND PLAN:  #1 acute prostatitis.  Urinalysis is normal today.  His urine is a bit malodorous and concentrated. Bactrim double strength, 1 twice daily x 14 days. Urine sent for culture and sensitivity for completeness.  #2 BPH with lower urinary tract obstructive symptoms. Start Flomax 0.4 mg nightly. He wants to see how this goes and we will then consider urology referral as needed in the future.  An After Visit Summary was printed and given to the patient.  FOLLOW UP: Return for 2-3 wks f/u prostat/bph.  Signed:  Crissie Sickles, MD           10/16/2022

## 2022-10-18 LAB — URINE CULTURE
MICRO NUMBER:: 14513135
SPECIMEN QUALITY:: ADEQUATE

## 2022-10-19 ENCOUNTER — Encounter: Payer: Self-pay | Admitting: Family Medicine

## 2022-10-19 ENCOUNTER — Other Ambulatory Visit: Payer: Self-pay | Admitting: Family Medicine

## 2022-10-19 MED ORDER — AMOXICILLIN 875 MG PO TABS: 875.0000 mg | ORAL_TABLET | Freq: Two times a day (BID) | ORAL | 0 refills | Status: AC

## 2022-11-03 ENCOUNTER — Encounter: Payer: Self-pay | Admitting: Family Medicine

## 2022-11-03 ENCOUNTER — Ambulatory Visit: Payer: Managed Care, Other (non HMO) | Admitting: Family Medicine

## 2022-11-03 VITALS — BP 136/86 | HR 65 | Temp 97.9°F | Ht 64.0 in | Wt 159.0 lb

## 2022-11-03 DIAGNOSIS — I10 Essential (primary) hypertension: Secondary | ICD-10-CM | POA: Diagnosis not present

## 2022-11-03 DIAGNOSIS — N138 Other obstructive and reflux uropathy: Secondary | ICD-10-CM

## 2022-11-03 DIAGNOSIS — Z125 Encounter for screening for malignant neoplasm of prostate: Secondary | ICD-10-CM | POA: Diagnosis not present

## 2022-11-03 DIAGNOSIS — N410 Acute prostatitis: Secondary | ICD-10-CM

## 2022-11-03 DIAGNOSIS — N401 Enlarged prostate with lower urinary tract symptoms: Secondary | ICD-10-CM | POA: Diagnosis not present

## 2022-11-03 DIAGNOSIS — D509 Iron deficiency anemia, unspecified: Secondary | ICD-10-CM

## 2022-11-03 DIAGNOSIS — I2510 Atherosclerotic heart disease of native coronary artery without angina pectoris: Secondary | ICD-10-CM | POA: Diagnosis not present

## 2022-11-03 DIAGNOSIS — R7303 Prediabetes: Secondary | ICD-10-CM | POA: Diagnosis not present

## 2022-11-03 LAB — COMPREHENSIVE METABOLIC PANEL
ALT: 38 U/L (ref 0–53)
AST: 36 U/L (ref 0–37)
Albumin: 4.4 g/dL (ref 3.5–5.2)
Alkaline Phosphatase: 56 U/L (ref 39–117)
BUN: 21 mg/dL (ref 6–23)
CO2: 25 mEq/L (ref 19–32)
Calcium: 9.9 mg/dL (ref 8.4–10.5)
Chloride: 101 mEq/L (ref 96–112)
Creatinine, Ser: 1.39 mg/dL (ref 0.40–1.50)
GFR: 55.75 mL/min — ABNORMAL LOW (ref >60.00)
Glucose, Bld: 95 mg/dL (ref 70–99)
Potassium: 4.3 mEq/L (ref 3.5–5.1)
Sodium: 137 mEq/L (ref 135–145)
Total Bilirubin: 0.5 mg/dL (ref 0.2–1.2)
Total Protein: 7.3 g/dL (ref 6.0–8.3)

## 2022-11-03 LAB — MICROALBUMIN / CREATININE URINE RATIO
Creatinine,U: 42.9 mg/dL
Microalb Creat Ratio: 16.5 mg/g (ref 0.0–30.0)
Microalb, Ur: 7.1 mg/dL — ABNORMAL HIGH (ref 0.0–1.9)

## 2022-11-03 LAB — CBC
HCT: 38.8 % — ABNORMAL LOW (ref 39.0–52.0)
Hemoglobin: 12.5 g/dL — ABNORMAL LOW (ref 13.0–17.0)
MCHC: 32.1 g/dL (ref 30.0–36.0)
MCV: 77.1 fl — ABNORMAL LOW (ref 78.0–100.0)
Platelets: 171 10*3/uL (ref 150.0–400.0)
RBC: 5.03 Mil/uL (ref 4.22–5.81)
RDW: 20 % — ABNORMAL HIGH (ref 11.5–15.5)
WBC: 3.4 10*3/uL — ABNORMAL LOW (ref 4.0–10.5)

## 2022-11-03 LAB — POCT URINALYSIS DIPSTICK
Bilirubin, UA: NEGATIVE
Blood, UA: NEGATIVE
Glucose, UA: NEGATIVE
Leukocytes, UA: NEGATIVE
Nitrite, UA: NEGATIVE
Protein, UA: NEGATIVE
Spec Grav, UA: 1.01 (ref 1.010–1.025)
Urobilinogen, UA: 0.2 E.U./dL — AB
pH, UA: 6 (ref 5.0–8.0)

## 2022-11-03 LAB — LIPID PANEL
Cholesterol: 154 mg/dL (ref 0–200)
HDL: 36.8 mg/dL — ABNORMAL LOW (ref >39.00)
LDL Cholesterol: 93 mg/dL (ref 0–99)
NonHDL: 116.74
Total CHOL/HDL Ratio: 4
Triglycerides: 120 mg/dL (ref 0.0–149.0)
VLDL: 24 mg/dL (ref 0.0–40.0)

## 2022-11-03 LAB — HEMOGLOBIN A1C: Hgb A1c MFr Bld: 6.4 % (ref 4.6–6.5)

## 2022-11-03 LAB — PSA: PSA: 2.47 ng/mL (ref 0.10–4.00)

## 2022-11-03 MED ORDER — TAMSULOSIN HCL 0.4 MG PO CAPS: 0.4000 mg | ORAL_CAPSULE | Freq: Every day | ORAL | 3 refills | Status: DC

## 2022-11-03 NOTE — Progress Notes (Signed)
OFFICE VISIT  11/03/2022  CC:  Chief Complaint  Patient presents with   Benign Prostatic Hypertrophy    Pt is fasting     Patient is a 57 y.o. male who presents for 2-week follow-up acute prostatitis superimposed on symptomatic BPH. A/P as of last visit: "#1 acute prostatitis.  Urinalysis is normal today.  His urine is a bit malodorous and concentrated. Bactrim double strength, 1 twice daily x 14 days. Urine sent for culture and sensitivity for completeness.   #2 BPH with lower urinary tract obstructive symptoms. Start Flomax 0.4 mg nightly. He wants to see how this goes and we will then consider urology referral as needed in the future."  INTERIM HX: Still has a bit of a twinge after urinating but only intermittently.  Urinary frequency is still above his baseline, but only a little bit. He does feel like the Flomax is helping his urine stream be stronger and he is emptying his bladder now.  He does have some orthostatic dizziness and some fatigue from the Flomax but this has nearly resolved now.  ROS as above, plus--> no fevers, no CP, no SOB, no wheezing, no cough, no HAs, no rashes, no melena/hematochezia.  No polyuria or polydipsia.  No myalgias or arthralgias.  No focal weakness, paresthesias, or tremors.  No acute vision or hearing abnormalities.  No recent changes in lower legs. No n/v/d or abd pain.  No palpitations.     Past Medical History:  Diagnosis Date   Acute epiglottitis 01/2014   BPH with obstruction/lower urinary tract symptoms    [facility] referral done 01/[facility] (Dr. Risa Grill); ultimately a prostate biopsy was done for increased PSA velocity--April 2013--and it  was completely normal.   CAD S/P percutaneous coronary angioplasty 05/05/2017   Progressive Angina: (3 V CAD on Cor Calcium Score) --> LEFT HEART CATH & CORONARY ANGIOGRAPHY  / CORONARY STENT INTERVENTION Conclusion   LESION #1: p-mLAD to Mid LAD 80 %s --> PCI SYNERGY DES 2.75X16 - Tapered  post-dilation: 3.1-2.9 mm.  LESION #2: pRI 85 % - PCI SYNERGY DES 3X12.  Initial plan ASA/Plavix x 1 yr  --> ASA stopped 06/2017 for GI Bleed-- on Plavix alone and continued w/this 11/2017   Chronic renal insufficiency, stage 3 (moderate) (HCC)    Diabetes mellitus without complication ([place])    A999333 6.8% Dec 2023   Elevated liver function tests    mild   Epididymal cyst 09/2019   Right (vs spermatocele.  Hematocele less likely since no hx of trauma).   Erectile dysfunction    Family history of colon cancer    father   History of adenomatous polyp of colon 2007; 2014; 08/2019   Dr. Henrene Pastor did 2020 scope.  Recall 2025.   History of sciatica 11/2013   responded well to prednisone burst 11/2013   Hyperlipidemia    On 40 mg Crestor as of 06/2017   Hypertension    On telmisartan   Iron deficiency anemia 06/2017   Hb drop from 15 down to 11 from 04/2017-06/2017.  +H pylori gastritis+.  Recurrence 01/2019 +correlation with melena and fatigue.  Pt has been back on iron x 2 wks and is making arrangements to see his GI MD as of 02/08/19 (? repeat EGD + is overdue for repeat colonoscopy). Hemoccults NEG X 3 Sept 2023.   UTI (urinary tract infection)    enterobacter 10/2022    Past Surgical History:  Procedure Laterality Date   CARDIOVASCULAR STRESS TEST  11/16/2019  Normal EF, no ischemia   COLONOSCOPY W/ POLYPECTOMY  09/02/06; 07/2013; 08/2019   2014 [facility]: adenomatous polyp w/out high grade dysplasia or malignancy.  08/2019 adenoma x 2, diverticulosis, recall 5 yrs.   coronary calcium score     Very high risk: as of 03/2017 pt is to return to discuss with Dr. Ellyn Hack: stress test vs cath as next step.   CORONARY STENT INTERVENTION N/A 05/05/2017   DES x 2. Preserved LV function.  Procedure: CORONARY STENT INTERVENTION;  Surgeon: Leonie Man, MD;  Location: [facility];  Service: Cardiovascular;: CAD S/P PCI: p-mLAD Synergy DES 2.75 x 16 (3.1-2.9 mm), p-mRI Synergy DES 3.0 x 12 (3.3 mm)    dental implants     ESOPHAGOGASTRODUODENOSCOPY  07/08/2017   GI RECOMMENDED D/C ASPIRIN:  superfic non-bleeding ulcers ranging 5-35m in size in antrum.  +H pylori +.  A few non-bleeding erosions in pre-pyloric region.  Normal duodenum.   LEFT HEART CATH AND CORONARY ANGIOGRAPHY N/A 05/05/2017   Procedure: LEFT HEART CATH AND CORONARY ANGIOGRAPHY;  Surgeon: HLeonie Man MD;  [facility];  Service: Cardiovascular;  Laterality: N/A; p-m LAD 80%, mRI 90% --> 2 vessel PCI   NM MYOVIEW LTD  11/16/2019   Fixed basal inferoseptal and mid anteroseptal perfusion defect consistent with subendocardial scar, NO ISCHEMIA.  EF 52%.  Unable to interpret EKG.  LOW Risk.   PROSTATE BIOPSY  01/2012   Normal    Outpatient Medications Prior to Visit  Medication Sig Dispense Refill   clopidogrel (PLAVIX) 75 MG tablet Take 1 tablet (75 mg total) by mouth daily. 90 tablet 1   Multiple Vitamin (MULTIVITAMIN WITH MINERALS) TABS tablet Take 1 tablet by mouth daily.     pantoprazole (PROTONIX) 40 MG tablet Take 1 tablet (40 mg total) by mouth 2 (two) times daily. (Patient taking differently: Take 40 mg by mouth daily.) 180 tablet 3   rosuvastatin (CRESTOR) 20 MG tablet TAKE 1 TABLET BY MOUTH EVERY DAY 90 tablet 1   sildenafil (VIAGRA) 100 MG tablet Take 1 tablet (100 mg total) by mouth daily as needed for erectile dysfunction. 6 tablet 5   telmisartan (MICARDIS) 80 MG tablet Take 1 tablet (80 mg total) by mouth daily. 90 tablet 3   triamcinolone cream (KENALOG) 0.1 % Apply to affected area bid prn 30 g 3   sulfamethoxazole-trimethoprim (BACTRIM DS) 800-160 MG tablet Take 1 tablet by mouth 2 (two) times daily. 28 tablet 0   tamsulosin (FLOMAX) 0.4 MG CAPS capsule Take 1 capsule (0.4 mg total) by mouth daily. 30 capsule 1   No facility-administered medications prior to visit.    Allergies  Allergen Reactions   Ace Inhibitors Cough   Shrimp [Shellfish Allergy] Other (See Comments)    Swelling  around eyes/lips---can eat IF cooked WELL   Thiazide-Type Diuretics     FATIGUE; DIZZINESS.      Review of Systems As per HPI  PE:    11/03/2022    9:07 AM 10/16/2022    3:06 PM 10/12/2022   11:42 AM  Vitals with BMI  Height 5' 4"$  5' 4"$    Weight 159 lbs 164 lbs   BMI [id number]  Systolic [id number] Diastolic 86 80 91  Pulse 65 76      Physical Exam  Gen: Alert, well appearing.  Patient is oriented to person, place, time, and situation. AFFECT: pleasant, lucid thought and speech. CV: RRR, no m/r/g.  LUNGS: CTA bilat, nonlabored resps, good aeration in all lung fields. EXT: no clubbing or cyanosis.  no edema.    LABS:  Last CBC Lab Results  Component Value Date   WBC 4.3 08/21/2022   HGB 11.6 (L) 08/21/2022   HCT 36.2 (L) 08/21/2022   MCV 83.8 08/21/2022   MCH 26.9 (L) 08/21/2022   RDW 13.9 08/21/2022   PLT 178 08/21/2022   Lab Results  Component Value Date   IRON 23 (L) 08/21/2022   TIBC 451 (H) 08/21/2022   FERRITIN 5 (L) 08/21/2022   Lab Results  Component Value Date   VITAMINB12 904 [id number]   Last metabolic panel Lab Results  Component Value Date   GLUCOSE 105 (H) 08/21/2022   NA 136 08/21/2022   K 4.8 08/21/2022   CL 101 08/21/2022   CO2 25 08/21/2022   BUN 19 08/21/2022   CREATININE 1.57 (H) 08/21/2022   EGFR 56 (L) 02/25/2022   CALCIUM 9.6 08/21/2022   PROT 7.1 08/21/2022   ALBUMIN 4.2 05/15/2022   LABGLOB 2.6 12/30/2021   AGRATIO 1.7 12/30/2021   BILITOT 0.4 08/21/2022   ALKPHOS 54 05/15/2022   AST 32 08/21/2022   ALT 37 08/21/2022   ANIONGAP 7 05/06/2017   Last lipids Lab Results  Component Value Date   CHOL 106 05/15/2022   HDL 31.60 (L) 05/15/2022   LDLCALC 52 05/15/2022   LDLDIRECT 72.0 12/08/2016   TRIG 110.0 05/15/2022   CHOLHDL 3 05/15/2022   Last hemoglobin A1c Lab Results  Component Value Date   HGBA1C 6.8 (H) 08/21/2022   Lab Results  Component Value Date   PSA 0.73 10/06/2021   PSA 0.56 04/03/2020    PSA 0.64 02/07/2019   CC UA normal today  IMPRESSION AND PLAN:  #1 BPH, improved on Flomax 0.4 mg nightly.  2.  Acute prostatitis/UTI. Symptoms nearly completely resolved status post 2 weeks of Bactrim. Urinalysis normal today. Observe for complete resolution and he will call if symptoms persistent.  3.  Coronary artery disease. Continue Plavix, Crestor, and telmisartan. Will forward labs to his cardiologist, Dr. Oval Linsey.  #4 iron deficiency anemia. It seems he has not completely replaced his iron after having a upper GI bleed a few years ago. Most recent hemoglobin was 11.6 about 2 months ago. Recheck CBC today.  #5 chronic renal insufficiency, stage IIIa. Monitor electrolytes and creatinine today. Checking urine microalbumin/creatinine today.  6.  Borderline diabetes. Monitor hemoglobin A1c and fasting glucose today. No meds at this time.  #7 prostate cancer screening. PSA today.  An After Visit Summary was printed and given to the patient.  FOLLOW UP: Return in about 4 months (around 03/04/2023) for routine chronic illness f/u.  Signed:  Crissie Sickles, MD           11/03/2022

## 2022-11-13 ENCOUNTER — Other Ambulatory Visit: Payer: Self-pay | Admitting: Family Medicine

## 2022-11-13 NOTE — Telephone Encounter (Signed)
Pt called about refill for triamcinolone cream (KENALOG) 0.1 %. Pharmacy is correct at [facility] in [place]

## 2022-11-16 ENCOUNTER — Telehealth (HOSPITAL_BASED_OUTPATIENT_CLINIC_OR_DEPARTMENT_OTHER): Payer: Self-pay | Admitting: *Deleted

## 2022-11-16 DIAGNOSIS — E785 Hyperlipidemia, unspecified: Secondary | ICD-10-CM

## 2022-11-16 MED ORDER — ROSUVASTATIN CALCIUM 40 MG PO TABS: 40.0000 mg | ORAL_TABLET | Freq: Every day | ORAL | 3 refills | Status: DC

## 2022-11-16 NOTE — Telephone Encounter (Signed)
-----   Message from Skeet Latch, MD sent at 11/14/2022 10:35 AM EST ----- Cholesterol is going in the wrong direction.  Increase rosuvastatin to '40mg'$ .  Repeat lipids/CMP in 2-3 months.  LDL needs to be <70.

## 2022-11-18 ENCOUNTER — Telehealth (HOSPITAL_BASED_OUTPATIENT_CLINIC_OR_DEPARTMENT_OTHER): Payer: Self-pay | Admitting: *Deleted

## 2022-11-18 DIAGNOSIS — E785 Hyperlipidemia, unspecified: Secondary | ICD-10-CM

## 2022-11-18 MED ORDER — EZETIMIBE 10 MG PO TABS: 10.0000 mg | ORAL_TABLET | Freq: Every day | ORAL | 3 refills | Status: DC

## 2022-11-18 MED ORDER — ROSUVASTATIN CALCIUM 20 MG PO TABS: 20.0000 mg | ORAL_TABLET | Freq: Every day | ORAL | 3 refills | Status: DC

## 2022-11-18 NOTE — Telephone Encounter (Signed)
-----   Message from Skeet Latch, MD sent at 11/14/2022 10:35 AM EST ----- Cholesterol is going in the wrong direction.  Increase rosuvastatin to '40mg'$ .  Repeat lipids/CMP in 2-3 months.  LDL needs to be <70.

## 2022-11-18 NOTE — Telephone Encounter (Signed)
Spoke with patient regarding labs Currently taking Zetia with Crestor 20 mg, LDL 52 When increased Crestor previously caused elevated AST/ALT  Discussed with Dr Oval Linsey and will resume Crestor 20 mg daily and Zetia 10 mg daily Advised patient, verbalized understanding   Confirmed with patient no issues with the Crestor 20 mg and Zetia combination

## 2022-12-04 ENCOUNTER — Ambulatory Visit: Payer: Managed Care, Other (non HMO) | Admitting: Family Medicine

## 2022-12-04 ENCOUNTER — Encounter: Payer: Self-pay | Admitting: Family Medicine

## 2022-12-04 VITALS — BP 127/79 | HR 74 | Temp 97.8°F | Ht 64.0 in | Wt 164.2 lb

## 2022-12-04 DIAGNOSIS — Z87440 Personal history of urinary (tract) infections: Secondary | ICD-10-CM

## 2022-12-04 DIAGNOSIS — R300 Dysuria: Secondary | ICD-10-CM

## 2022-12-04 DIAGNOSIS — N401 Enlarged prostate with lower urinary tract symptoms: Secondary | ICD-10-CM | POA: Diagnosis not present

## 2022-12-04 DIAGNOSIS — N410 Acute prostatitis: Secondary | ICD-10-CM

## 2022-12-04 DIAGNOSIS — N138 Other obstructive and reflux uropathy: Secondary | ICD-10-CM

## 2022-12-04 LAB — POC URINALSYSI DIPSTICK (AUTOMATED)
Bilirubin, UA: NEGATIVE
Blood, UA: NEGATIVE
Glucose, UA: NEGATIVE
Ketones, UA: NEGATIVE
Leukocytes, UA: NEGATIVE
Nitrite, UA: NEGATIVE
Protein, UA: POSITIVE — AB
Spec Grav, UA: >=1.03 — AB (ref 1.010–1.025)
Urobilinogen, UA: 1 E.U./dL
pH, UA: 6 (ref 5.0–8.0)

## 2022-12-04 MED ORDER — CEFDINIR 300 MG PO CAPS: 300.0000 mg | ORAL_CAPSULE | Freq: Two times a day (BID) | ORAL | 0 refills | Status: AC

## 2022-12-04 NOTE — Progress Notes (Signed)
OFFICE VISIT  12/04/2022  CC:  Chief Complaint  Patient presents with   Medical Management of Chronic Issues   Patient is a 57 y.o. male who presents for burning with urination. I last saw him 1 mo ago. A/P as of that visit: "#1 BPH, improved on Flomax 0.4 mg nightly.   2.  Acute prostatitis/UTI. Symptoms nearly completely resolved status post 2 weeks of Bactrim. Urinalysis normal today. Observe for complete resolution and he will call if symptoms persistent.   3.  Coronary artery disease. Continue Plavix, Crestor, and telmisartan. Will forward labs to his cardiologist, Dr. Oval Linsey.   #4 iron deficiency anemia. It seems he has not completely replaced his iron after having a upper GI bleed a few years ago. Most recent hemoglobin was 11.6 about 2 months ago. Recheck CBC today.   #5 chronic renal insufficiency, stage IIIa. Monitor electrolytes and creatinine today. Checking urine microalbumin/creatinine today.   6.  Borderline diabetes. Monitor hemoglobin A1c and fasting glucose today. No meds at this time.   #7 prostate cancer screening. PSA today."  INTERIM HX: Thomas Spencer has been having some burning with urination again, also occasionally just loses a few drops of urine randomly. Feels like the Flomax is still helping well for urinary hesitancy and nocturia. Symptoms are mild and seem to be actually improved since he made his appointment here 4 days ago.  No fevers, no nausea, no blood in urine.    Past Medical History:  Diagnosis Date   Acute epiglottitis 01/2014   BPH with obstruction/lower urinary tract symptoms    [facility] referral done 01/[facility] (Dr. Risa Grill); ultimately a prostate biopsy was done for increased PSA velocity--April 2013--and it  was completely normal.   CAD S/P percutaneous coronary angioplasty 05/05/2017   Progressive Angina: (3 V CAD on Cor Calcium Score) --> LEFT HEART CATH & CORONARY ANGIOGRAPHY  / CORONARY STENT INTERVENTION Conclusion    LESION #1: p-mLAD to Mid LAD 80 %s --> PCI SYNERGY DES 2.75X16 - Tapered post-dilation: 3.1-2.9 mm.  LESION #2: pRI 85 % - PCI SYNERGY DES 3X12.  Initial plan ASA/Plavix x 1 yr  --> ASA stopped 06/2017 for GI Bleed-- on Plavix alone and continued w/this 11/2017   Chronic renal insufficiency, stage 3 (moderate) (HCC)    Diabetes mellitus without complication ([place])    A999333 6.8% Dec 2023   Elevated liver function tests    mild   Epididymal cyst 09/2019   Right (vs spermatocele.  Hematocele less likely since no hx of trauma).   Erectile dysfunction    Family history of colon cancer    father   History of adenomatous polyp of colon 2007; 2014; 08/2019   Dr. Henrene Pastor did 2020 scope.  Recall 2025.   History of sciatica 11/2013   responded well to prednisone burst 11/2013   Hyperlipidemia    On 40 mg Crestor as of 06/2017   Hypertension    On telmisartan   Iron deficiency anemia 06/2017   Hb drop from 15 down to 11 from 04/2017-06/2017.  +H pylori gastritis+.  Recurrence 01/2019 +correlation with melena and fatigue.  Pt has been back on iron x 2 wks and is making arrangements to see his GI MD as of 02/08/19 (? repeat EGD + is overdue for repeat colonoscopy). Hemoccults NEG X 3 Sept 2023.   UTI (urinary tract infection)    enterobacter 10/2022    Past Surgical History:  Procedure Laterality Date   CARDIOVASCULAR STRESS TEST  11/16/2019  Normal EF, no ischemia   COLONOSCOPY W/ POLYPECTOMY  09/02/06; 07/2013; 08/2019   2014 [facility]: adenomatous polyp w/out high grade dysplasia or malignancy.  08/2019 adenoma x 2, diverticulosis, recall 5 yrs.   coronary calcium score     Very high risk: as of 03/2017 pt is to return to discuss with Dr. Ellyn Hack: stress test vs cath as next step.   CORONARY STENT INTERVENTION N/A 05/05/2017   DES x 2. Preserved LV function.  Procedure: CORONARY STENT INTERVENTION;  Surgeon: Leonie Man, MD;  Location: [facility];  Service: Cardiovascular;: CAD S/P PCI:  p-mLAD Synergy DES 2.75 x 16 (3.1-2.9 mm), p-mRI Synergy DES 3.0 x 12 (3.3 mm)   dental implants     ESOPHAGOGASTRODUODENOSCOPY  07/08/2017   GI RECOMMENDED D/C ASPIRIN:  superfic non-bleeding ulcers ranging 5-42mm in size in antrum.  +H pylori +.  A few non-bleeding erosions in pre-pyloric region.  Normal duodenum.   LEFT HEART CATH AND CORONARY ANGIOGRAPHY N/A 05/05/2017   Procedure: LEFT HEART CATH AND CORONARY ANGIOGRAPHY;  Surgeon: Leonie Man, MD;  Location: [facility];  Service: Cardiovascular;  Laterality: N/A; p-m LAD 80%, mRI 90% --> 2 vessel PCI   NM MYOVIEW LTD  11/16/2019   Fixed basal inferoseptal and mid anteroseptal perfusion defect consistent with subendocardial scar, NO ISCHEMIA.  EF 52%.  Unable to interpret EKG.  LOW Risk.   PROSTATE BIOPSY  01/2012   Normal    Outpatient Medications Prior to Visit  Medication Sig Dispense Refill   clopidogrel (PLAVIX) 75 MG tablet Take 1 tablet (75 mg total) by mouth daily. 90 tablet 1   ezetimibe (ZETIA) 10 MG tablet Take 1 tablet (10 mg total) by mouth daily. 90 tablet 3   ferrous sulfate 325 (65 FE) MG tablet Take 325 mg by mouth 2 (two) times daily.     Multiple Vitamin (MULTIVITAMIN WITH MINERALS) TABS tablet Take 1 tablet by mouth daily.     pantoprazole (PROTONIX) 40 MG tablet Take 1 tablet (40 mg total) by mouth 2 (two) times daily. (Patient taking differently: Take 40 mg by mouth daily.) 180 tablet 3   rosuvastatin (CRESTOR) 20 MG tablet Take 1 tablet (20 mg total) by mouth daily. 90 tablet 3   sildenafil (VIAGRA) 100 MG tablet Take 1 tablet (100 mg total) by mouth daily as needed for erectile dysfunction. 6 tablet 5   tamsulosin (FLOMAX) 0.4 MG CAPS capsule Take 1 capsule (0.4 mg total) by mouth daily. 90 capsule 3   telmisartan (MICARDIS) 80 MG tablet Take 1 tablet (80 mg total) by mouth daily. 90 tablet 3   triamcinolone cream (KENALOG) 0.1 % APPLY TO AFFECTED AREA TWICE A DAY AS NEEDED (Patient not taking: Reported  on 12/04/2022) 30 g 1   No facility-administered medications prior to visit.    Allergies  Allergen Reactions   Ace Inhibitors Cough   Shrimp [Shellfish Allergy] Other (See Comments)    Swelling around eyes/lips---can eat IF cooked WELL   Thiazide-Type Diuretics     FATIGUE; DIZZINESS.      Review of Systems As per HPI  PE:    12/04/2022    2:39 PM 11/03/2022    9:07 AM 10/16/2022    3:06 PM  Vitals with BMI  Height 5\' 4"  5\' 4"  5\' 4"   Weight 164 lbs 3 oz 159 lbs 164 lbs  BMI 28.17 0000000 [id number]  Systolic [id number]  Diastolic 79 86 80  Pulse  74 65 76     Physical Exam  Gen: Alert, well appearing.  Patient is oriented to person, place, time, and situation. AFFECT: pleasant, lucid thought and speech. No further exam today.  LABS:  Last CBC Lab Results  Component Value Date   WBC 3.4 (L) 11/03/2022   HGB 12.5 (L) 11/03/2022   HCT 38.8 (L) 11/03/2022   MCV 77.1 (L) 11/03/2022   MCH 26.9 (L) 08/21/2022   RDW 20.0 (H) 11/03/2022   PLT 171.0 11/03/2022   Lab Results  Component Value Date   IRON 23 (L) 08/21/2022   TIBC 451 (H) 08/21/2022   FERRITIN 5 (L) [id number]   Last metabolic panel Lab Results  Component Value Date   GLUCOSE 95 11/03/2022   NA 137 11/03/2022   K 4.3 11/03/2022   CL 101 11/03/2022   CO2 25 11/03/2022   BUN 21 11/03/2022   CREATININE 1.39 11/03/2022   EGFR 56 (L) 02/25/2022   CALCIUM 9.9 11/03/2022   PROT 7.3 11/03/2022   ALBUMIN 4.4 11/03/2022   LABGLOB 2.6 12/30/2021   AGRATIO 1.7 12/30/2021   BILITOT 0.5 11/03/2022   ALKPHOS 56 11/03/2022   AST 36 11/03/2022   ALT 38 11/03/2022   ANIONGAP 7 05/06/2017   Last lipids Lab Results  Component Value Date   CHOL 154 11/03/2022   HDL 36.80 (L) 11/03/2022   LDLCALC 93 11/03/2022   LDLDIRECT 72.0 12/08/2016   TRIG 120.0 11/03/2022   CHOLHDL 4 11/03/2022   Last hemoglobin A1c Lab Results  Component Value Date   HGBA1C 6.4 11/03/2022   Last thyroid functions Lab Results   Component Value Date   TSH 1.20 04/03/2020   Lab Results  Component Value Date   PSA 2.47 11/03/2022   PSA 0.73 10/06/2021   PSA 0.56 04/03/2020   IMPRESSION AND PLAN:  #1 dysuria, history of culture positive UTI, history of BPH and suspected acute prostatitis. He is having same symptoms he was having about 6 weeks ago when he had Enterobacter UTI. UA today normal. I am going to treat him with cefdinir 300 mg twice daily x 14 days. He is in favor of referral to a urologist as well so I have ordered this. Urine specimen sent for culture for completeness.  An After Visit Summary was printed and given to the patient.  FOLLOW UP: No follow-ups on file.  Signed:  Crissie Sickles, MD           12/04/2022

## 2022-12-07 LAB — URINE CULTURE
MICRO NUMBER:: 14730987
SPECIMEN QUALITY:: ADEQUATE

## 2022-12-08 ENCOUNTER — Other Ambulatory Visit: Payer: Self-pay | Admitting: Family Medicine

## 2022-12-08 NOTE — Telephone Encounter (Signed)
Please complete PA

## 2022-12-14 NOTE — Telephone Encounter (Signed)
Submitted a Prior Authorization request to [employer] for  Pantoprazole  via CoverMyMeds. Will update once we receive a response.  Key: [id number]

## 2022-12-15 NOTE — Telephone Encounter (Signed)
Pharmacy Patient Advocate Encounter  Prior Authorization for Pantoprazole has been approved by [employer] (ins).    PA # [id number] Effective dates: 12/14/2022 through 12/14/2023

## 2022-12-22 ENCOUNTER — Telehealth: Payer: Self-pay | Admitting: Cardiovascular Disease

## 2022-12-22 NOTE — Telephone Encounter (Signed)
Returned call to patient, provided the following recommendations. Scheduled patient for 4/16 at 825am with Gillian Shields, NP        "Okay to schedule for OV for dyspnea. However, given dark tarry stool concern for GI bleed or anemia. He does have appt with Dr. Milinda Cave this week which he should keep as scheduled. If he has significant dyspnea, recommend evaluation in urgent care or ED.     CCing Dr. Milinda Cave as FYI only.    Alver Sorrow, NP"

## 2022-12-22 NOTE — Telephone Encounter (Signed)
Okay to schedule for OV for dyspnea. However, given dark tarry stool concern for GI bleed or anemia. He does have appt with Dr. Milinda Cave this week which he should keep as scheduled. If he has significant dyspnea, recommend evaluation in urgent care or ED.   CCing Dr. Milinda Cave as FYI only.   Thomas Sorrow, NP

## 2022-12-22 NOTE — Telephone Encounter (Signed)
Pt c/o of Chest Pain: STAT if CP now or developed within 24 hours  1. Are you having CP right now? No  2. Are you experiencing any other symptoms (ex. SOB, nausea, vomiting, sweating)? SOB  3. How long have you been experiencing CP? This past weekend  4. Is your CP continuous or coming and going? Comes and go  5. Have you taken Nitroglycerin? No   Pt c/o Shortness Of Breath: STAT if SOB developed within the last 24 hours or pt is noticeably SOB on the phone  1. Are you currently SOB (can you hear that pt is SOB on the phone)? No  2. How long have you been experiencing SOB? This past weekend  3. Are you SOB when sitting or when up moving around? Come and go  4. Are you currently experiencing any other symptoms? No   Pt also wants to make provider aware of "tar like looking stool".  ?

## 2022-12-23 NOTE — Telephone Encounter (Signed)
Noted  

## 2022-12-24 ENCOUNTER — Ambulatory Visit: Payer: Managed Care, Other (non HMO) | Admitting: Family Medicine

## 2022-12-25 ENCOUNTER — Telehealth: Payer: Self-pay | Admitting: Cardiovascular Disease

## 2022-12-25 NOTE — Telephone Encounter (Signed)
   They're out of town, and would like to get a call as soon as possible. She said, to call her at [phone]

## 2022-12-25 NOTE — Telephone Encounter (Signed)
Spoke with wife and patient continues to have black/tarry stools Thursday last week he was in the gym walking on treadmill with no issues and now short of breath walking to car, worse over the week They are in [place]  Advised wife patient needs to go to ED for evaluation, verbalized understanding   Dr Duke Salvia made aware

## 2022-12-25 NOTE — Telephone Encounter (Signed)
Wife called wanting to know if his issue of his SOB is an iron issue or he is damaging his heart further.  She said his family has history of heart issue.  While speaking with her call was dropped.  See phone note date 12/22/22.

## 2022-12-28 ENCOUNTER — Encounter (HOSPITAL_BASED_OUTPATIENT_CLINIC_OR_DEPARTMENT_OTHER): Payer: Self-pay

## 2022-12-28 ENCOUNTER — Telehealth: Payer: Self-pay

## 2022-12-28 DIAGNOSIS — K922 Gastrointestinal hemorrhage, unspecified: Secondary | ICD-10-CM

## 2022-12-28 DIAGNOSIS — R7303 Prediabetes: Secondary | ICD-10-CM | POA: Insufficient documentation

## 2022-12-28 DIAGNOSIS — N182 Chronic kidney disease, stage 2 (mild): Secondary | ICD-10-CM | POA: Insufficient documentation

## 2022-12-28 HISTORY — DX: Gastrointestinal hemorrhage, unspecified: K92.2

## 2022-12-28 NOTE — Telephone Encounter (Signed)
Patient wife came to speak to Dr. Milinda Cave about getting referral to GI doctor today.  Patient was seen ED in [place] on 12/25/22 and pt was told that he see GI doctor today and get scope (EGD). She was very demanding to speak to Dr. Milinda Cave.  I told patient wife that Dr. Milinda Cave and his assistant were in patient care clinic today. Dr. Milinda Cave is also overbooked today and would not be able to see patient.  She asked me what the best choice for patient.  I told her to take husband to ED.  I could send a note back as high priority and someone would call her.  She got upset and walked out the door. ED provider suggested patient to be admitted and patient declined for further evaluation.  Patient sees GI doctor Dr. Yancey Flemings.  I told patient wife to call GI doctor.  She said she didn't think he was still in practice.  Please call as soon as possible please.

## 2022-12-28 NOTE — Telephone Encounter (Signed)
LVM for pt regarding referral/recommnedations.  Note: please get the name of ED in [place] and schedule pt for work in at his convenience

## 2022-12-28 NOTE — Telephone Encounter (Signed)
Please review and advise.

## 2022-12-28 NOTE — Telephone Encounter (Signed)
Routing to [employer] as high priority.  Please call patient when available.

## 2022-12-28 NOTE — Telephone Encounter (Signed)
Pls order GI referral for upper GI bleed, urgent (He was seen at [facility] back in 06/2017 for same diagnosis but [facility] would be fine too---essentially whomever can see him first. Pls get records from his out-of-town medical encounter from a few days ago. Offer visit with me as work in any time. --thx

## 2022-12-29 ENCOUNTER — Ambulatory Visit (HOSPITAL_BASED_OUTPATIENT_CLINIC_OR_DEPARTMENT_OTHER): Payer: Managed Care, Other (non HMO) | Admitting: Family

## 2022-12-31 ENCOUNTER — Ambulatory Visit: Payer: Managed Care, Other (non HMO) | Admitting: Family Medicine

## 2023-01-01 ENCOUNTER — Telehealth: Payer: Self-pay

## 2023-01-01 NOTE — Transitions of Care (Post Inpatient/ED Visit) (Signed)
   01/01/2023  Name: Thomas Spencer MRN: [id number] DOB: 1965/12/23  Today's TOC FU Call Status: Today's TOC FU Call Status:: Successful TOC FU Call Competed TOC FU Call Complete Date: 01/01/23  Transition Care Management Follow-up Telephone Call Date of Discharge: 12/31/22 Discharge Facility: Other ([facility]) Name of Other ([facility]) Discharge Facility: [facility] Type of Discharge: Inpatient Admission Primary Inpatient Discharge Diagnosis:: "GI hemorrhage" How have you been since you were released from the hospital?: Better (Pt states "things trending in right direction-no further bleeding with stools." He had normal BM this morning. Appetite good. He voices his "Hgb is sitll a ltitle low" which makes im feel tired/weak.) Any questions or concerns?: No  Items Reviewed: Did you receive and understand the discharge instructions provided?: Yes Medications obtained and verified?: No (Patient reported no new med changes) Any new allergies since your discharge?: No Dietary orders reviewed?: Yes Type of Diet Ordered:: low salt/heart healthy Do you have support at home?: Yes People in Home: spouse Name of Support/Comfort Primary Source: [facility] and Equipment/Supplies: Were Home Health Services Ordered?: NA Any new equipment or medical supplies ordered?: NA  Functional Questionnaire: Do you need assistance with bathing/showering or dressing?: No Do you need assistance with meal preparation?: No Do you need assistance with eating?: No Do you have difficulty maintaining continence: No Do you need assistance with getting out of bed/getting out of a chair/moving?: No Do you have difficulty managing or taking your medications?: No  Follow up appointments reviewed: PCP Follow-up appointment confirmed?: Yes Date of PCP follow-up appointment?: 01/08/23 Follow-up Provider: Dr. Milinda Cave Specialist [facility] Follow-up appointment confirmed?: Yes Date of Specialist follow-up  appointment?: 02/01/23 Follow-Up Specialty Provider:: Dr. Olga Coaster MD Do you need transportation to your follow-up appointment?: No Do you understand care options if your condition(s) worsen?: Yes-patient verbalized understanding  SDOH Interventions Today    Flowsheet Row Most Recent Value  SDOH Interventions   Food Insecurity Interventions Intervention Not Indicated  Transportation Interventions Intervention Not Indicated      TOC Interventions Today    Flowsheet Row Most Recent Value  TOC Interventions   TOC Interventions Discussed/Reviewed TOC Interventions Discussed      Interventions Today    Flowsheet Row Most Recent Value  General Interventions   General Interventions Discussed/Reviewed General Interventions Discussed, Doctor Visits  Doctor Visits Discussed/Reviewed PCP, Doctor Visits Discussed, Specialist  PCP/Specialist Visits Compliance with follow-up visit  Education Interventions   Education Provided Provided Education  Provided Verbal Education On Nutrition, When to see the doctor, Medication  Nutrition Interventions   Nutrition Discussed/Reviewed Nutrition Discussed  Pharmacy Interventions   Pharmacy Dicussed/Reviewed Pharmacy Topics Discussed       Alessandra Grout [facility] Care Management Care Management Community Coordinator Direct Phone: [phone] Toll Free: [phone] Fax: [phone]

## 2023-01-08 ENCOUNTER — Encounter: Payer: Self-pay | Admitting: Family Medicine

## 2023-01-08 ENCOUNTER — Ambulatory Visit: Payer: Managed Care, Other (non HMO) | Admitting: Family Medicine

## 2023-01-08 VITALS — BP 135/86 | HR 90 | Temp 98.8°F | Ht 64.0 in | Wt 160.8 lb

## 2023-01-08 DIAGNOSIS — Z8719 Personal history of other diseases of the digestive system: Secondary | ICD-10-CM | POA: Diagnosis not present

## 2023-01-08 DIAGNOSIS — D500 Iron deficiency anemia secondary to blood loss (chronic): Secondary | ICD-10-CM | POA: Diagnosis not present

## 2023-01-08 DIAGNOSIS — K922 Gastrointestinal hemorrhage, unspecified: Secondary | ICD-10-CM

## 2023-01-08 NOTE — Progress Notes (Signed)
01/08/2023  CC:  Chief Complaint  Patient presents with   Hospitalization Follow-up    Patient is a 57 y.o. male who presents for  hospital follow up, specifically Transitional Care Services face-to-face visit. Dates hospitalized: April 15 through December 31, 2022. Days since d/c from hospital: 8 days Patient was discharged from hospital to home. Reason for admission to hospital: Upper GI bleed and anemia Date of interactive (phone) contact with patient and/or caregiver: 01/01/2023.  I have reviewed patient's discharge summary plus pertinent specific notes, labs, and imaging from the hospitalization.   Patient had been having dyspnea on exertion, chest heaviness, fatigue, and black stool.   He initially presented to the hospital in [facility] on 12/25/2022.  They ruled out cardiac causes.  They recommended admission for further evaluation of his black stools but he preferred to head back home and seek GI evaluation as an outpatient. He is in went to [facility] on 12/27/2021 for ongoing black stool and fatigue. Hemoglobin 9.6 on admission. His lowest hemoglobin was 8.1.  This stayed stable and was 8.3 upon discharge.  He did not have to be transfused. 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  This was cauterized.  The remainder of the endoscopy appeared normal. Additionally, he had some urinary symptoms.  Urine culture grew Enterococcus faecalis that was sensitive to nitrofurantoin, ampicillin, and vancomycin.  He was prescribed linezolid.  Discharge medications: Pantoprazole 40 mg twice a day, Plavix 75 mg a day, Carafate, telmisartan, Zetia, iron twice a day, rosuvastatin, and tamsulosin, linezolid.  CURRENTLY: He feels a little bit better, still significant fatigue though. No further black stools. He is taking iron once a day.  He finished his linezolid and has no urinary symptoms at this  time.   Medication reconciliation was done today and patient is taking meds as recommended by discharging hospitalist/specialist.    ROS as above, plus--> no fevers, no wheezing, no cough, no dizziness, no HAs, no rashes. No polyuria or polydipsia.  No myalgias or arthralgias.  No focal weakness, paresthesias, or tremors.  No acute vision or hearing abnormalities.  No dysuria or unusual/new urinary urgency or frequency.  No recent changes in lower legs. No n/v/d or abd pain.  No palpitations.    PMH:  Past Medical History:  Diagnosis Date   Acute epiglottitis 01/2014   BPH with obstruction/lower urinary tract symptoms    [facility] (Dr. Isabel Caprice); ultimately a prostate biopsy was done for increased PSA velocity--April 2013--and it  was completely normal.   CAD S/P percutaneous coronary angioplasty 05/05/2017   Progressive Angina: (3 V CAD on Cor Calcium Score) --> LEFT HEART CATH & CORONARY ANGIOGRAPHY  / CORONARY STENT INTERVENTION Conclusion   LESION #1: p-mLAD to Mid LAD 80 %s --> PCI SYNERGY DES 2.75X16 - Tapered post-dilation: 3.1-2.9 mm.  LESION #2: pRI 85 % - PCI SYNERGY DES 3X12.  Initial plan ASA/Plavix x 1 yr  --> ASA stopped 06/2017 for GI Bleed-- on Plavix alone and continued w/this 11/2017   Chronic renal insufficiency, stage 3 (moderate) (HCC)    Diabetes mellitus without complication (HCC)    a1c 6.8% Dec 2023   Elevated liver function tests    mild   Epididymal cyst 09/2019   Right (vs spermatocele.  Hematocele less likely since no hx of trauma).   Erectile dysfunction    Family history of colon cancer    father  History of adenomatous polyp of colon 2007; 2014; 08/2019   Dr. Marina Goodell did 2020 scope.  Recall 2025.   History of sciatica 11/2013   responded well to prednisone burst 11/2013   Hyperlipidemia    On 40 mg Crestor as of 06/2017   Hypertension    On telmisartan   Iron deficiency anemia 06/2017   Hb drop from 15 down to 11 from  04/2017-06/2017.  +H pylori gastritis+.  Recurrence 01/2019 +correlation with melena and fatigue.  Pt has been back on iron x 2 wks and is making arrangements to see his GI MD as of 02/08/19 (? repeat EGD + is overdue for repeat colonoscopy). Hemoccults NEG X 3 Sept 2023.   UTI (urinary tract infection)    enterobacter 10/2022    PSH:  Past Surgical History:  Procedure Laterality Date   CARDIOVASCULAR STRESS TEST  11/16/2019   Normal EF, no ischemia   COLONOSCOPY W/ POLYPECTOMY  09/02/06; 07/2013; 08/2019   2014 [facility]: adenomatous polyp w/out high grade dysplasia or malignancy.  08/2019 adenoma x 2, diverticulosis, recall 5 yrs.   coronary calcium score     Very high risk: as of 03/2017 pt is to return to discuss with Dr. Herbie Baltimore: stress test vs cath as next step.   CORONARY STENT INTERVENTION N/A 05/05/2017   DES x 2. Preserved LV function.  Procedure: CORONARY STENT INTERVENTION;  Surgeon: Marykay Lex, MD;  Location: [facility] INVASIVE CV LAB;  Service: Cardiovascular;: CAD S/P PCI: p-mLAD Synergy DES 2.75 x 16 (3.1-2.9 mm), p-mRI Synergy DES 3.0 x 12 (3.3 mm)   dental implants     ESOPHAGOGASTRODUODENOSCOPY  07/08/2017   GI RECOMMENDED D/C ASPIRIN:  superfic non-bleeding ulcers ranging 5-28mm in size in antrum.  +H pylori +.  A few non-bleeding erosions in pre-pyloric region.  Normal duodenum.   LEFT HEART CATH AND CORONARY ANGIOGRAPHY N/A 05/05/2017   Procedure: LEFT HEART CATH AND CORONARY ANGIOGRAPHY;  Surgeon: Marykay Lex, MD;  Location: [facility] INVASIVE CV LAB;  Service: Cardiovascular;  Laterality: N/A; p-m LAD 80%, mRI 90% --> 2 vessel PCI   NM MYOVIEW LTD  11/16/2019   Fixed basal inferoseptal and mid anteroseptal perfusion defect consistent with subendocardial scar, NO ISCHEMIA.  EF 52%.  Unable to interpret EKG.  LOW Risk.   PROSTATE BIOPSY  01/2012   Normal    MEDS:  Outpatient Medications Prior to Visit  Medication Sig Dispense Refill   clopidogrel (PLAVIX) 75 MG tablet TAKE 1  TABLET BY MOUTH EVERY DAY 90 tablet 0   ezetimibe (ZETIA) 10 MG tablet Take 1 tablet (10 mg total) by mouth daily. 90 tablet 3   ferrous sulfate 325 (65 FE) MG tablet Take 325 mg by mouth 2 (two) times daily.     Multiple Vitamin (MULTIVITAMIN WITH MINERALS) TABS tablet Take 1 tablet by mouth daily.     pantoprazole (PROTONIX) 40 MG tablet TAKE 1 TABLET BY MOUTH TWICE A DAY 180 tablet 0   rosuvastatin (CRESTOR) 20 MG tablet Take 1 tablet (20 mg total) by mouth daily. 90 tablet 3   sildenafil (VIAGRA) 100 MG tablet Take 1 tablet (100 mg total) by mouth daily as needed for erectile dysfunction. 6 tablet 5   sucralfate (CARAFATE) 1 g tablet Take 1 g by mouth 4 (four) times daily -  with meals and at bedtime.     tamsulosin (FLOMAX) 0.4 MG CAPS capsule Take 1 capsule (0.4 mg total) by mouth daily. 90 capsule 3  telmisartan (MICARDIS) 80 MG tablet Take 1 tablet (80 mg total) by mouth daily. 90 tablet 3   triamcinolone cream (KENALOG) 0.1 % APPLY TO AFFECTED AREA TWICE A DAY AS NEEDED (Patient not taking: Reported on 12/04/2022) 30 g 1   No facility-administered medications prior to visit.    Physical Exam    01/08/2023    3:23 PM 12/04/2022    2:39 PM 11/03/2022    9:07 AM  Vitals with BMI  Height 5\' 4"  5\' 4"  5\' 4"   Weight 160 lbs 13 oz 164 lbs 3 oz 159 lbs  BMI 27.59 28.17 27.28  Systolic 135 127 952  Diastolic 86 79 86  Pulse 90 74 65   Gen: Alert, well appearing.  Patient is oriented to person, place, time, and situation. AFFECT: pleasant, lucid thought and speech. No further exam today  Pertinent labs/imaging Last CBC Lab Results  Component Value Date   WBC 3.4 (L) 11/03/2022   HGB 12.5 (L) 11/03/2022   HCT 38.8 (L) 11/03/2022   MCV 77.1 (L) 11/03/2022   MCH 26.9 (L) 08/21/2022   RDW 20.0 (H) 11/03/2022   PLT 171.0 11/03/2022   Lab Results  Component Value Date   IRON 23 (L) 08/21/2022   TIBC 451 (H) 08/21/2022   FERRITIN 5 (L) 08/21/2022   Last metabolic panel Lab  Results  Component Value Date   GLUCOSE 95 11/03/2022   NA 137 11/03/2022   K 4.3 11/03/2022   CL 101 11/03/2022   CO2 25 11/03/2022   BUN 21 11/03/2022   CREATININE 1.39 11/03/2022   EGFR 56 (L) 02/25/2022   CALCIUM 9.9 11/03/2022   PROT 7.3 11/03/2022   ALBUMIN 4.4 11/03/2022   LABGLOB 2.6 12/30/2021   AGRATIO 1.7 12/30/2021   BILITOT 0.5 11/03/2022   ALKPHOS 56 11/03/2022   AST 36 11/03/2022   ALT 38 11/03/2022   ANIONGAP 7 05/06/2017   Lab Results  Component Value Date   HGBA1C 6.4 11/03/2022   ASSESSMENT/PLAN:  #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 Resolved with cauterization by GI in hospital. Recheck CBC, iron panel, and Hemoccults today. He will increase his iron to twice a day as tolerated. Continue pantoprazole 40 mg twice daily for approximately 1 month. Okay to continue Plavix. He has a follow-up appointment with GI on 02/01/23 ([facility]).  2.  Enterococcus UTI. Now asymptomatic status post linezolid treatment. I referred [facility] to [facility] on 12/04/2022 for recurrent acute prostatitis superimposed on BPH.  Medical decision making of moderate complexity was utilized today.  FOLLOW UP: As needed  Signed:  Santiago Bumpers, MD           01/08/2023

## 2023-01-09 LAB — CBC WITH DIFFERENTIAL/PLATELET
Absolute Monocytes: 438 cells/uL (ref 200–950)
Basophils Absolute: 32 cells/uL (ref 0–200)
Basophils Relative: 0.9 %
Eosinophils Absolute: 60 cells/uL (ref 15–500)
Eosinophils Relative: 1.7 %
HCT: 29 % — ABNORMAL LOW (ref 38.5–50.0)
Hemoglobin: 9.2 g/dL — ABNORMAL LOW (ref 13.2–17.1)
Lymphs Abs: 1365 cells/uL (ref 850–3900)
MCH: 26.6 pg — ABNORMAL LOW (ref 27.0–33.0)
MCHC: 31.7 g/dL — ABNORMAL LOW (ref 32.0–36.0)
MCV: 83.8 fL (ref 80.0–100.0)
MPV: 10.7 fL (ref 7.5–12.5)
Monocytes Relative: 12.5 %
Neutro Abs: 1607 cells/uL (ref 1500–7800)
Neutrophils Relative %: 45.9 %
Platelets: 260 10*3/uL (ref 140–400)
RBC: 3.46 10*6/uL — ABNORMAL LOW (ref 4.20–5.80)
RDW: 18.1 % — ABNORMAL HIGH (ref 11.0–15.0)
Total Lymphocyte: 39 %
WBC: 3.5 10*3/uL — ABNORMAL LOW (ref 3.8–10.8)

## 2023-01-09 LAB — IRON,TIBC AND FERRITIN PANEL
%SAT: 98 % (calc) — ABNORMAL HIGH (ref 20–48)
Ferritin: 9 ng/mL — ABNORMAL LOW (ref 38–380)
Iron: 416 ug/dL — ABNORMAL HIGH (ref 50–180)
TIBC: 426 mcg/dL (calc) — ABNORMAL HIGH (ref 250–425)

## 2023-01-12 ENCOUNTER — Telehealth: Payer: Self-pay

## 2023-01-12 DIAGNOSIS — D500 Iron deficiency anemia secondary to blood loss (chronic): Secondary | ICD-10-CM

## 2023-01-12 NOTE — Telephone Encounter (Signed)
Patient has questions regarding his lab results.  Please call [phone].

## 2023-01-12 NOTE — Telephone Encounter (Signed)
Pt saw results on my chart and thinks his iron is too high. He wants to know if he should continue taking Iron supplements.

## 2023-01-13 LAB — HEMOCCULT SLIDES (X 3 CARDS)
Fecal Occult Blood: NEGATIVE
OCCULT 1: NEGATIVE
OCCULT 2: NEGATIVE
OCCULT 3: NEGATIVE
OCCULT 4: NEGATIVE
OCCULT 5: NEGATIVE

## 2023-01-13 NOTE — Telephone Encounter (Signed)
FYI -->I called patient and discussed the iron panel results with him.  Reassured him.  The labs are appropriate for having iron deficiency anemia and having recently started iron supplement. He will continue daily iron supplement and we will recheck labs in about a month. Okay to close this encounter now. Signed:  Santiago Bumpers, MD           01/13/2023

## 2023-01-13 NOTE — Telephone Encounter (Signed)
Noted  

## 2023-01-14 ENCOUNTER — Other Ambulatory Visit (HOSPITAL_BASED_OUTPATIENT_CLINIC_OR_DEPARTMENT_OTHER): Payer: Self-pay | Admitting: Cardiovascular Disease

## 2023-01-14 NOTE — Telephone Encounter (Signed)
Rx(s) sent to pharmacy electronically.  

## 2023-01-26 ENCOUNTER — Other Ambulatory Visit: Payer: Self-pay | Admitting: Family Medicine

## 2023-02-26 ENCOUNTER — Telehealth: Payer: Self-pay | Admitting: Family Medicine

## 2023-02-26 DIAGNOSIS — N2889 Other specified disorders of kidney and ureter: Secondary | ICD-10-CM

## 2023-02-26 DIAGNOSIS — E119 Type 2 diabetes mellitus without complications: Secondary | ICD-10-CM

## 2023-02-26 DIAGNOSIS — N1831 Chronic kidney disease, stage 3a: Secondary | ICD-10-CM

## 2023-02-26 DIAGNOSIS — R7401 Elevation of levels of liver transaminase levels: Secondary | ICD-10-CM

## 2023-02-26 NOTE — Telephone Encounter (Signed)
Please advise if A1c can be added for lab visit 6/17

## 2023-02-26 NOTE — Telephone Encounter (Signed)
Patient called to get scheduled for labs that were ordered in May. He is coming in Monday 6/17, however he wants to make sure his A1C will be checked at this time as well. Please confirm A1C is a part of the labs ordered and contact the patient to confirm with him.

## 2023-02-26 NOTE — Telephone Encounter (Signed)
Okay, orders are in 

## 2023-02-26 NOTE — Telephone Encounter (Signed)
Pt advised labs ordered including A1c.

## 2023-03-01 ENCOUNTER — Other Ambulatory Visit (INDEPENDENT_AMBULATORY_CARE_PROVIDER_SITE_OTHER): Payer: Managed Care, Other (non HMO)

## 2023-03-01 DIAGNOSIS — D500 Iron deficiency anemia secondary to blood loss (chronic): Secondary | ICD-10-CM | POA: Diagnosis not present

## 2023-03-01 DIAGNOSIS — N1831 Chronic kidney disease, stage 3a: Secondary | ICD-10-CM

## 2023-03-01 DIAGNOSIS — R7401 Elevation of levels of liver transaminase levels: Secondary | ICD-10-CM

## 2023-03-01 DIAGNOSIS — N2889 Other specified disorders of kidney and ureter: Secondary | ICD-10-CM

## 2023-03-01 DIAGNOSIS — E119 Type 2 diabetes mellitus without complications: Secondary | ICD-10-CM

## 2023-03-01 LAB — COMPREHENSIVE METABOLIC PANEL
ALT: 22 U/L (ref 0–53)
AST: 23 U/L (ref 0–37)
Albumin: 4 g/dL (ref 3.5–5.2)
Alkaline Phosphatase: 52 U/L (ref 39–117)
BUN: 20 mg/dL (ref 6–23)
CO2: 27 mEq/L (ref 19–32)
Calcium: 9.3 mg/dL (ref 8.4–10.5)
Chloride: 102 mEq/L (ref 96–112)
Creatinine, Ser: 1.46 mg/dL (ref 0.40–1.50)
GFR: 52.44 mL/min — ABNORMAL LOW (ref >60.00)
Glucose, Bld: 95 mg/dL (ref 70–99)
Potassium: 5.1 mEq/L (ref 3.5–5.1)
Sodium: 136 mEq/L (ref 135–145)
Total Bilirubin: 0.3 mg/dL (ref 0.2–1.2)
Total Protein: 6.8 g/dL (ref 6.0–8.3)

## 2023-03-01 LAB — CBC
HCT: 36.4 % — ABNORMAL LOW (ref 39.0–52.0)
Hemoglobin: 11.5 g/dL — ABNORMAL LOW (ref 13.0–17.0)
MCHC: 31.6 g/dL (ref 30.0–36.0)
MCV: 83.6 fl (ref 78.0–100.0)
Platelets: 244 10*3/uL (ref 150.0–400.0)
RBC: 4.35 Mil/uL (ref 4.22–5.81)
RDW: 19.9 % — ABNORMAL HIGH (ref 11.5–15.5)
WBC: 4.2 10*3/uL (ref 4.0–10.5)

## 2023-03-01 LAB — HEMOGLOBIN A1C: Hgb A1c MFr Bld: 5.7 % (ref 4.6–6.5)

## 2023-03-02 LAB — IRON,TIBC AND FERRITIN PANEL
%SAT: 51 % (calc) — ABNORMAL HIGH (ref 20–48)
Ferritin: 11 ng/mL — ABNORMAL LOW (ref 38–380)
Iron: 193 ug/dL — ABNORMAL HIGH (ref 50–180)
TIBC: 375 mcg/dL (calc) (ref 250–425)

## 2023-03-07 ENCOUNTER — Other Ambulatory Visit: Payer: Self-pay | Admitting: Family Medicine

## 2023-04-08 ENCOUNTER — Telehealth: Payer: Self-pay | Admitting: Family Medicine

## 2023-04-08 NOTE — Telephone Encounter (Signed)
Patient will be traveling to [place] next Thursday 8/1. He is requesting a prescription for Cipro before his trip. He would like to have it prior to travelling in case he needs. He also requested this for his , however I advised him his wife was not a patient here and she would have to be established.  Please give the patient a call if this request can be approved.

## 2023-04-09 MED ORDER — CIPROFLOXACIN HCL 500 MG PO TABS
500.0000 mg | ORAL_TABLET | Freq: Two times a day (BID) | ORAL | 0 refills | Status: DC
Start: 2023-04-09 — End: 2023-07-27

## 2023-04-09 NOTE — Telephone Encounter (Signed)
Pt made aware

## 2023-04-09 NOTE — Telephone Encounter (Signed)
Cipro eRxd

## 2023-04-13 ENCOUNTER — Other Ambulatory Visit: Payer: Self-pay

## 2023-04-13 MED ORDER — ROSUVASTATIN CALCIUM 20 MG PO TABS: 20.0000 mg | ORAL_TABLET | Freq: Every day | ORAL | 0 refills | Status: DC

## 2023-07-27 ENCOUNTER — Encounter: Payer: Self-pay | Admitting: Family Medicine

## 2023-07-27 ENCOUNTER — Ambulatory Visit: Payer: Managed Care, Other (non HMO) | Admitting: Family Medicine

## 2023-07-27 VITALS — BP 132/77 | HR 55 | Ht 64.0 in | Wt 160.0 lb

## 2023-07-27 DIAGNOSIS — E119 Type 2 diabetes mellitus without complications: Secondary | ICD-10-CM | POA: Diagnosis not present

## 2023-07-27 DIAGNOSIS — I10 Essential (primary) hypertension: Secondary | ICD-10-CM

## 2023-07-27 DIAGNOSIS — D500 Iron deficiency anemia secondary to blood loss (chronic): Secondary | ICD-10-CM

## 2023-07-27 DIAGNOSIS — E7800 Pure hypercholesterolemia, unspecified: Secondary | ICD-10-CM | POA: Diagnosis not present

## 2023-07-27 LAB — COMPREHENSIVE METABOLIC PANEL
ALT: 27 U/L (ref 0–53)
AST: 26 U/L (ref 0–37)
Albumin: 4.4 g/dL (ref 3.5–5.2)
Alkaline Phosphatase: 51 U/L (ref 39–117)
BUN: 19 mg/dL (ref 6–23)
CO2: 28 meq/L (ref 19–32)
Calcium: 9.2 mg/dL (ref 8.4–10.5)
Chloride: 100 meq/L (ref 96–112)
Creatinine, Ser: 1.41 mg/dL (ref 0.40–1.50)
GFR: 54.52 mL/min — ABNORMAL LOW (ref >60.00)
Glucose, Bld: 100 mg/dL — ABNORMAL HIGH (ref 70–99)
Potassium: 4.4 meq/L (ref 3.5–5.1)
Sodium: 135 meq/L (ref 135–145)
Total Bilirubin: 0.8 mg/dL (ref 0.2–1.2)
Total Protein: 7 g/dL (ref 6.0–8.3)

## 2023-07-27 LAB — IBC + FERRITIN
Ferritin: 32.6 ng/mL (ref 22.0–322.0)
Iron: 184 ug/dL — ABNORMAL HIGH (ref 42–165)
Saturation Ratios: 51.5 % — ABNORMAL HIGH (ref 20.0–50.0)
TIBC: 357 ug/dL (ref 250.0–450.0)
Transferrin: 255 mg/dL (ref 212.0–360.0)

## 2023-07-27 LAB — CBC
HCT: 46 % (ref 39.0–52.0)
Hemoglobin: 15.8 g/dL (ref 13.0–17.0)
MCHC: 34.3 g/dL (ref 30.0–36.0)
MCV: 94.2 fL (ref 78.0–100.0)
Platelets: 127 10*3/uL — ABNORMAL LOW (ref 150.0–400.0)
RBC: 4.89 Mil/uL (ref 4.22–5.81)
RDW: 14.7 % (ref 11.5–15.5)
WBC: 3.8 10*3/uL — ABNORMAL LOW (ref 4.0–10.5)

## 2023-07-27 LAB — HEMOGLOBIN A1C: Hgb A1c MFr Bld: 6.3 % (ref 4.6–6.5)

## 2023-07-27 NOTE — Progress Notes (Signed)
OFFICE VISIT  07/27/2023  CC:  Chief Complaint  Patient presents with   Fatigue    Pt would like iron levels rechecked, last labs done 03/01/23    Patient is a 57 y.o. male who presents for follow-up iron deficiency anemia, diabetes, hypertension, and chronic renal insufficiency stage III.  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 He takes iron once daily. Most recent hemoccults were NEG on 01/13/2023. Last Hb 11.5 about 5 mo ago.  Other pertinent medications: Plavix daily Pantoprazole twice daily   For his BPH with history of recurrent UTI he saw urology on 02/12/2023 -->tamsulosin and tadalafil.  ROS as above, plus-->mildly tired but not a fatigue that concerns him regarding his iron/Hb. No fevers, no CP, no SOB, no wheezing, no cough, no dizziness, no HAs, no rashes, no melena/hematochezia.  No polyuria or polydipsia.  No myalgias or arthralgias.  No focal weakness, paresthesias, or tremors.  No acute vision or hearing abnormalities.  No dysuria or unusual/new urinary urgency or frequency.  No recent changes in lower legs. No n/v/d or abd pain.  No palpitations.    Past Medical History:  Diagnosis Date   Acute epiglottitis 01/2014   BPH with obstruction/lower urinary tract symptoms    [facility] referral done [facility] (Dr. Isabel Caprice); ultimately a prostate biopsy was done for increased PSA velocity--April 2013--and it  was completely normal.   CAD S/P percutaneous coronary angioplasty 05/05/2017   Progressive Angina: (3 V CAD on Cor Calcium Score) --> LEFT HEART CATH & CORONARY ANGIOGRAPHY  / CORONARY STENT INTERVENTION Conclusion   LESION #1: p-mLAD to Mid LAD 80 %s --> PCI SYNERGY DES 2.75X16 - Tapered post-dilation: 3.1-2.9 mm.  LESION #2: pRI 85 % - PCI SYNERGY DES 3X12.  Initial plan ASA/Plavix x 1 yr  --> ASA stopped 06/2017 for GI Bleed-- on Plavix alone and continued w/this 11/2017   Chronic renal insufficiency, stage 3 (moderate) (HCC)    Diabetes  mellitus without complication (HCC)    a1c 6.8% Dec 2023   Elevated liver function tests    mild   Epididymal cyst 09/2019   Right (vs spermatocele.  Hematocele less likely since no hx of trauma).   Erectile dysfunction    Family history of colon cancer    father   History of adenomatous polyp of colon 2007; 2014; 08/2019   Dr. Marina Goodell did 2020 scope.  Recall 2025.   History of sciatica 11/2013   responded well to prednisone burst 11/2013   Hyperlipidemia    On 40 mg Crestor as of 06/2017   Hypertension    On telmisartan   Iron deficiency anemia 06/2017   Hb drop from 15 down to 11 from 04/2017-06/2017.  +H pylori gastritis+.  Recurrence 01/2019 +correlation with melena and fatigue.  Pt has been back on iron x 2 wks and is making arrangements to see his GI MD as of 02/08/19 (? repeat EGD + is overdue for repeat colonoscopy). Hemoccults NEG X 3 Sept 2023.   Upper GI bleed 12/28/2022   bleeding duodenal AVM   UTI (urinary tract infection)    Enterococcus faecalis on urine clx Feb, Mar, and April 2024    Past Surgical History:  Procedure Laterality Date   CARDIOVASCULAR STRESS TEST  11/16/2019   Normal EF, no ischemia   COLONOSCOPY W/ POLYPECTOMY  09/02/06; 07/2013; 08/2019   2014 [facility]: adenomatous polyp w/out high grade dysplasia or malignancy.  08/2019 adenoma x 2, diverticulosis, recall  5 yrs.   coronary calcium score     Very high risk: as of 03/2017 pt is to return to discuss with Dr. Herbie Baltimore: stress test vs cath as next step.   CORONARY STENT INTERVENTION N/A 05/05/2017   DES x 2. Preserved LV function.  Procedure: CORONARY STENT INTERVENTION;  Surgeon: Marykay Lex, MD;  Location: [facility] INVASIVE CV LAB;  Service: Cardiovascular;: CAD S/P PCI: p-mLAD Synergy DES 2.75 x 16 (3.1-2.9 mm), p-mRI Synergy DES 3.0 x 12 (3.3 mm)   dental implants     ESOPHAGOGASTRODUODENOSCOPY  07/08/2017   06/2017 GI RECOMMENDED D/C ASPIRIN:  superfic non-bleeding ulcers ranging 5-60mm in size in  antrum.  +H pylori +.  A few non-bleeding erosions in pre-pyloric region.  Normal duodenum.  12/2022 bleeding duodenal AVM   LEFT HEART CATH AND CORONARY ANGIOGRAPHY N/A 05/05/2017   Procedure: LEFT HEART CATH AND CORONARY ANGIOGRAPHY;  Surgeon: Marykay Lex, MD;  Location: [facility] INVASIVE CV LAB;  Service: Cardiovascular;  Laterality: N/A; p-m LAD 80%, mRI 90% --> 2 vessel PCI   NM MYOVIEW LTD  11/16/2019   Fixed basal inferoseptal and mid anteroseptal perfusion defect consistent with subendocardial scar, NO ISCHEMIA.  EF 52%.  Unable to interpret EKG.  LOW Risk.   PROSTATE BIOPSY  01/2012   Normal    Outpatient Medications Prior to Visit  Medication Sig Dispense Refill   clopidogrel (PLAVIX) 75 MG tablet TAKE 1 TABLET BY MOUTH EVERY DAY 90 tablet 3   ezetimibe (ZETIA) 10 MG tablet Take 1 tablet (10 mg total) by mouth daily. 90 tablet 3   ferrous sulfate 325 (65 FE) MG tablet Take 325 mg by mouth 2 (two) times daily.     Multiple Vitamin (MULTIVITAMIN WITH MINERALS) TABS tablet Take 1 tablet by mouth daily.     pantoprazole (PROTONIX) 40 MG tablet TAKE 1 TABLET BY MOUTH TWICE A DAY 180 tablet 0   rosuvastatin (CRESTOR) 20 MG tablet Take 1 tablet (20 mg total) by mouth daily. 90 tablet 0   sildenafil (VIAGRA) 100 MG tablet TAKE 1 TABLET BY MOUTH ONCE DAILY AS NEEDED FOR ERECTILE DYSFUNCTION 10 tablet 5   tamsulosin (FLOMAX) 0.4 MG CAPS capsule Take 1 capsule (0.4 mg total) by mouth daily. 90 capsule 3   telmisartan (MICARDIS) 80 MG tablet TAKE 1 TABLET BY MOUTH EVERY DAY 90 tablet 1   ciprofloxacin (CIPRO) 500 MG tablet Take 1 tablet (500 mg total) by mouth 2 (two) times daily. (Patient not taking: Reported on 07/27/2023) 20 tablet 0   sucralfate (CARAFATE) 1 g tablet Take 1 g by mouth 4 (four) times daily -  with meals and at bedtime. (Patient not taking: Reported on 07/27/2023)     triamcinolone cream (KENALOG) 0.1 % APPLY TO AFFECTED AREA TWICE A DAY AS NEEDED (Patient not taking: Reported on  12/04/2022) 30 g 1   No facility-administered medications prior to visit.    Allergies  Allergen Reactions   Ace Inhibitors Cough   Shrimp [Shellfish Allergy] Other (See Comments)    Swelling around eyes/lips---can eat IF cooked WELL   Thiazide-Type Diuretics     FATIGUE; DIZZINESS.      Review of Systems  As per HPI  PE:    07/27/2023    8:58 AM 01/08/2023    3:23 PM 12/04/2022    2:39 PM  Vitals with BMI  Height 5\' 4"  5\' 4"  5\' 4"   Weight 160 lbs 160 lbs 13 oz 164 lbs 3 oz  BMI 27.45 27.59 28.17  Systolic 132 135 563  Diastolic 77 86 79  Pulse 55 90 74    Physical Exam  Gen: Alert, well appearing.  Patient is oriented to person, place, time, and situation. AFFECT: pleasant, lucid thought and speech.   LABS:  Last CBC Lab Results  Component Value Date   WBC 4.2 03/01/2023   HGB 11.5 (L) 03/01/2023   HCT 36.4 (L) 03/01/2023   MCV 83.6 03/01/2023   MCH 26.6 (L) 01/08/2023   RDW 19.9 (H) 03/01/2023   PLT 244.0 03/01/2023   Lab Results  Component Value Date   IRON 193 (H) 03/01/2023   TIBC 375 03/01/2023   FERRITIN 11 (L) 03/01/2023   Lab Results  Component Value Date   VITAMINB12 904 07/01/2017   Lab Results  Component Value Date   PSA 2.47 11/03/2022   PSA 0.73 10/06/2021   PSA 0.56 04/03/2020   Lab Results  Component Value Date   HGBA1C 5.7 03/01/2023   Lab Results  Component Value Date   CHOL 154 11/03/2022   HDL 36.80 (L) 11/03/2022   LDLCALC 93 11/03/2022   LDLDIRECT 72.0 12/08/2016   TRIG 120.0 11/03/2022   CHOLHDL 4 11/03/2022     Chemistry      Component Value Date/Time   NA 136 03/01/2023 0802   NA 139 02/25/2022 1536   K 5.1 03/01/2023 0802   CL 102 03/01/2023 0802   CO2 27 03/01/2023 0802   BUN 20 03/01/2023 0802   BUN 21 02/25/2022 1536   CREATININE 1.46 03/01/2023 0802   CREATININE 1.57 (H) 08/21/2022 1638      Component Value Date/Time   CALCIUM 9.3 03/01/2023 0802   ALKPHOS 52 03/01/2023 0802   AST 23 03/01/2023  0802   ALT 22 03/01/2023 0802   BILITOT 0.3 03/01/2023 0802   BILITOT 0.3 12/30/2021 1151     IMPRESSION AND PLAN:  #1 iron deficiency anemia due to upper GI bleeding. No melena.  Has been on iron daily.  Due for CBC and iron panel to monitor this. Hemoccults were negative 6 months ago. Of note, his next colorectal cancer screening is due 2026 per his GI provider [facility].  #2 diabetes without complication. Diet controlled. Hemoglobin A1c ordered today.  3.  Hypertension, well-controlled on telmisartan 80 mg a day. Electrolytes and creatinine today.  4.  Hypercholesterolemia. Doing well on rosuvastatin 20 mg a day and Zetia 10 mg a day. Lipid panels followed by cardiology. Hepatic panel today.  An After Visit Summary was printed and given to the patient.  FOLLOW UP: Return in about 6 months (around 01/24/2024) for annual CPE (fasting). CPE due at any time  Signed:  Santiago Bumpers, MD           07/27/2023

## 2023-07-28 ENCOUNTER — Other Ambulatory Visit: Payer: Self-pay | Admitting: Family Medicine

## 2023-07-28 ENCOUNTER — Encounter: Payer: Self-pay | Admitting: Family Medicine

## 2023-07-28 DIAGNOSIS — D696 Thrombocytopenia, unspecified: Secondary | ICD-10-CM

## 2023-07-28 DIAGNOSIS — D72819 Decreased white blood cell count, unspecified: Secondary | ICD-10-CM

## 2023-08-09 ENCOUNTER — Ambulatory Visit (HOSPITAL_BASED_OUTPATIENT_CLINIC_OR_DEPARTMENT_OTHER): Payer: Managed Care, Other (non HMO) | Admitting: Family

## 2023-08-30 ENCOUNTER — Other Ambulatory Visit (HOSPITAL_BASED_OUTPATIENT_CLINIC_OR_DEPARTMENT_OTHER): Payer: Self-pay | Admitting: Cardiovascular Disease

## 2023-11-24 ENCOUNTER — Telehealth: Payer: Self-pay

## 2023-11-24 DIAGNOSIS — E119 Type 2 diabetes mellitus without complications: Secondary | ICD-10-CM

## 2023-11-24 NOTE — Telephone Encounter (Signed)
 Did the patient discuss referral with their provider in the last year? Yes  (If No - schedule appointment)  (If Yes - send message)    Appointment offered? No    Type of order/referral and detailed reason for visit: Endocrinology for diabetes    Preference of office, provider, location: R[facility] Bash, [facility] in [facility] [phone] if provider recommends someone else    If referral order, have you been seen by this specialty before? No  (If Yes, this issue or another issue? When? Where?    Can we respond through MyChart? Yes

## 2023-11-25 NOTE — Telephone Encounter (Signed)
 Pt advised.

## 2023-11-25 NOTE — Telephone Encounter (Signed)
 OK, referral ordered today for the physician he specified

## 2023-11-30 ENCOUNTER — Encounter: Payer: Self-pay | Admitting: Family Medicine

## 2023-11-30 ENCOUNTER — Ambulatory Visit: Admitting: Family Medicine

## 2023-11-30 VITALS — BP 126/80 | HR 62 | Temp 97.8°F | Ht 64.0 in | Wt 159.4 lb

## 2023-11-30 DIAGNOSIS — E7800 Pure hypercholesterolemia, unspecified: Secondary | ICD-10-CM

## 2023-11-30 DIAGNOSIS — D500 Iron deficiency anemia secondary to blood loss (chronic): Secondary | ICD-10-CM

## 2023-11-30 DIAGNOSIS — E119 Type 2 diabetes mellitus without complications: Secondary | ICD-10-CM

## 2023-11-30 DIAGNOSIS — I10 Essential (primary) hypertension: Secondary | ICD-10-CM

## 2023-11-30 DIAGNOSIS — Z7984 Long term (current) use of oral hypoglycemic drugs: Secondary | ICD-10-CM

## 2023-11-30 LAB — COMPREHENSIVE METABOLIC PANEL
ALT: 29 U/L (ref 0–53)
AST: 27 U/L (ref 0–37)
Albumin: 4.7 g/dL (ref 3.5–5.2)
Alkaline Phosphatase: 53 U/L (ref 39–117)
BUN: 20 mg/dL (ref 6–23)
CO2: 29 meq/L (ref 19–32)
Calcium: 9.9 mg/dL (ref 8.4–10.5)
Chloride: 100 meq/L (ref 96–112)
Creatinine, Ser: 1.46 mg/dL (ref 0.40–1.50)
GFR: 52.17 mL/min — ABNORMAL LOW (ref >60.00)
Glucose, Bld: 99 mg/dL (ref 70–99)
Potassium: 4.5 meq/L (ref 3.5–5.1)
Sodium: 138 meq/L (ref 135–145)
Total Bilirubin: 0.5 mg/dL (ref 0.2–1.2)
Total Protein: 7.4 g/dL (ref 6.0–8.3)

## 2023-11-30 LAB — LIPID PANEL
Cholesterol: 107 mg/dL (ref 0–200)
HDL: 30.7 mg/dL — ABNORMAL LOW (ref >39.00)
LDL Cholesterol: 59 mg/dL (ref 0–99)
NonHDL: 76.21
Total CHOL/HDL Ratio: 3
Triglycerides: 88 mg/dL (ref 0.0–149.0)
VLDL: 17.6 mg/dL (ref 0.0–40.0)

## 2023-11-30 LAB — HEMOGLOBIN A1C: Hgb A1c MFr Bld: 6 % (ref 4.6–6.5)

## 2023-11-30 NOTE — Progress Notes (Signed)
 OFFICE VISIT  11/30/2023  CC:  Chief Complaint  Patient presents with   Elevated Blood Sugar    Had been having excessive thirst for 2 weeks and some elevated glucoses frequently. Fhx of diabetes    Patient is a 58 y.o. male who presents for discussion of high blood sugars. I last saw him on 07/27/2023. A/P as of that visit: "#1 iron deficiency anemia due to upper GI bleeding. No melena.  Has been on iron daily.  Due for CBC and iron panel to monitor this. Hemoccults were negative 6 months ago. Of note, his next colorectal cancer screening is due 2026 per his GI provider with [facility].   #2 diabetes without complication. Diet controlled. Hemoglobin A1c ordered today.   3.  Hypertension, well-controlled on telmisartan 80 mg a day. Electrolytes and creatinine today.   4.  Hypercholesterolemia. Doing well on rosuvastatin 20 mg a day and Zetia 10 mg a day. Lipid panels followed by cardiology. Hepatic panel today."  INTERIM HX: Feeling well. Had been having some excessive thirst and excessive urination in the last couple of weeks.  It is improved over the last several days.  He checked his fasting glucose at home and it was 111 on 1 occasion and 112 1 another. 2-hour postprandial glucose was 172. No recent change in diet or exercise habits.  He does have an appointment arranged in a few months to establish care with endocrinologist.  He takes 1 iron tab every other day.  Denies melena or hematochezia.   Past Medical History:  Diagnosis Date   Acute epiglottitis 01/2014   BPH with obstruction/lower urinary tract symptoms    [facility] referral done 01/2011 (Dr. Isabel Caprice); ultimately a prostate biopsy was done for increased PSA velocity--April 2013--and it  was completely normal.   CAD S/P percutaneous coronary angioplasty 05/05/2017   Progressive Angina: (3 V CAD on Cor Calcium Score) --> LEFT HEART CATH & CORONARY ANGIOGRAPHY  / CORONARY STENT  INTERVENTION Conclusion   LESION #1: p-mLAD to Mid LAD 80 %s --> PCI SYNERGY DES 2.75X16 - Tapered post-dilation: 3.1-2.9 mm.  LESION #2: pRI 85 % - PCI SYNERGY DES 3X12.  Initial plan ASA/Plavix x 1 yr  --> ASA stopped 06/2017 for GI Bleed-- on Plavix alone and continued w/this 11/2017   Chronic renal insufficiency, stage 3 (moderate) (HCC)    Diabetes mellitus without complication (HCC)    a1c 6.8% Dec 2023   Elevated liver function tests    mild   Epididymal cyst 09/2019   Right (vs spermatocele.  Hematocele less likely since no hx of trauma).   Erectile dysfunction    Family history of colon cancer    father   History of adenomatous polyp of colon 2007; 2014; 08/2019   Dr. Marina Goodell did 2020 scope.  Recall 2025.   History of sciatica 11/2013   responded well to prednisone burst 11/2013   Hyperlipidemia    On 40 mg Crestor as of 06/2017   Hypertension    On telmisartan   Iron deficiency anemia 06/2017   Hb drop from 15 down to 11 from 04/2017-06/2017.  +H pylori gastritis+.  Recurrence 01/2019 +correlation with melena and fatigue.  Pt has been back on iron x 2 wks and is making arrangements to see his GI MD as of 02/08/19 (? repeat EGD + is overdue for repeat colonoscopy). Hemoccults NEG X 3 Sept 2023.   Upper GI bleed 12/28/2022   bleeding duodenal AVM   UTI (  urinary tract infection)    Enterococcus faecalis on urine clx Feb, Mar, and April 2024    Past Surgical History:  Procedure Laterality Date   CARDIOVASCULAR STRESS TEST  11/16/2019   Normal EF, no ischemia   COLONOSCOPY W/ POLYPECTOMY  09/02/06; 07/2013; 08/2019   2014 [facility]: adenomatous polyp w/out high grade dysplasia or malignancy.  08/2019 adenoma x 2, diverticulosis, recall 5 yrs.   coronary calcium score     Very high risk: as of 03/2017 pt is to return to discuss with Dr. Herbie Baltimore: stress test vs cath as next step.   CORONARY STENT INTERVENTION N/A 05/05/2017   DES x 2. Preserved LV function.  Procedure: CORONARY STENT  INTERVENTION;  Surgeon: Marykay Lex, MD;  Location: [facility] INVASIVE CV LAB;  Service: Cardiovascular;: CAD S/P PCI: p-mLAD Synergy DES 2.75 x 16 (3.1-2.9 mm), p-mRI Synergy DES 3.0 x 12 (3.3 mm)   dental implants     ESOPHAGOGASTRODUODENOSCOPY  07/08/2017   06/2017 GI RECOMMENDED D/C ASPIRIN:  superfic non-bleeding ulcers ranging 5-7mm in size in antrum.  +H pylori +.  A few non-bleeding erosions in pre-pyloric region.  Normal duodenum.  12/2022 bleeding duodenal AVM   LEFT HEART CATH AND CORONARY ANGIOGRAPHY N/A 05/05/2017   Procedure: LEFT HEART CATH AND CORONARY ANGIOGRAPHY;  Surgeon: Marykay Lex, MD;  Location: [facility] INVASIVE CV LAB;  Service: Cardiovascular;  Laterality: N/A; p-m LAD 80%, mRI 90% --> 2 vessel PCI   NM MYOVIEW LTD  11/16/2019   Fixed basal inferoseptal and mid anteroseptal perfusion defect consistent with subendocardial scar, NO ISCHEMIA.  EF 52%.  Unable to interpret EKG.  LOW Risk.   PROSTATE BIOPSY  01/2012   Normal    Outpatient Medications Prior to Visit  Medication Sig Dispense Refill   clopidogrel (PLAVIX) 75 MG tablet TAKE 1 TABLET BY MOUTH EVERY DAY 90 tablet 3   ezetimibe (ZETIA) 10 MG tablet Take 1 tablet (10 mg total) by mouth daily. 90 tablet 3   ferrous sulfate 325 (65 FE) MG tablet Take 325 mg by mouth 2 (two) times daily.     Multiple Vitamin (MULTIVITAMIN WITH MINERALS) TABS tablet Take 1 tablet by mouth daily.     pantoprazole (PROTONIX) 40 MG tablet TAKE 1 TABLET BY MOUTH TWICE A DAY 180 tablet 0   rosuvastatin (CRESTOR) 20 MG tablet Take 1 tablet (20 mg total) by mouth daily. 90 tablet 0   sildenafil (VIAGRA) 100 MG tablet TAKE 1 TABLET BY MOUTH ONCE DAILY AS NEEDED FOR ERECTILE DYSFUNCTION 10 tablet 5   tamsulosin (FLOMAX) 0.4 MG CAPS capsule Take 1 capsule (0.4 mg total) by mouth daily. 90 capsule 3   telmisartan (MICARDIS) 80 MG tablet TAKE 1 TABLET BY MOUTH EVERY DAY 90 tablet 0   No facility-administered medications prior to visit.     Allergies  Allergen Reactions   Ace Inhibitors Cough   Shrimp [Shellfish Allergy] Other (See Comments)    Swelling around eyes/lips---can eat IF cooked WELL   Thiazide-Type Diuretics     FATIGUE; DIZZINESS.      Review of Systems  As per HPI  PE:    11/30/2023    8:01 AM 07/27/2023    8:58 AM 01/08/2023    3:23 PM  Vitals with BMI  Height 5\' 4"  5\' 4"  5\' 4"   Weight 159 lbs 6 oz 160 lbs 160 lbs 13 oz  BMI 27.35 27.45 27.59  Systolic 126 132 161  Diastolic 80 77 86  Pulse  62 55 90   Physical Exam  Gen: Alert, well appearing.  Patient is oriented to person, place, time, and situation. AFFECT: pleasant, lucid thought and speech. No further exam today.  LABS:  Last CBC Lab Results  Component Value Date   WBC 3.8 (L) 07/27/2023   HGB 15.8 07/27/2023   HCT 46.0 07/27/2023   MCV 94.2 07/27/2023   MCH 26.6 (L) 01/08/2023   RDW 14.7 07/27/2023   PLT 127.0 (L) 07/27/2023   Lab Results  Component Value Date   IRON 184 (H) 07/27/2023   TIBC 357.0 07/27/2023   FERRITIN 32.6 07/27/2023   Lab Results  Component Value Date   VITAMINB12 904 07/01/2017   Last metabolic panel Lab Results  Component Value Date   GLUCOSE 100 (H) 07/27/2023   NA 135 07/27/2023   K 4.4 07/27/2023   CL 100 07/27/2023   CO2 28 07/27/2023   BUN 19 07/27/2023   CREATININE 1.41 07/27/2023   GFR 54.52 (L) 07/27/2023   CALCIUM 9.2 07/27/2023   PROT 7.0 07/27/2023   ALBUMIN 4.4 07/27/2023   LABGLOB 2.6 12/30/2021   AGRATIO 1.7 12/30/2021   BILITOT 0.8 07/27/2023   ALKPHOS 51 07/27/2023   AST 26 07/27/2023   ALT 27 07/27/2023   ANIONGAP 7 05/06/2017   Last lipids Lab Results  Component Value Date   CHOL 154 11/03/2022   HDL 36.80 (L) 11/03/2022   LDLCALC 93 11/03/2022   LDLDIRECT 72.0 12/08/2016   TRIG 120.0 11/03/2022   CHOLHDL 4 11/03/2022   Last hemoglobin A1c Lab Results  Component Value Date   HGBA1C 6.3 07/27/2023   IMPRESSION AND PLAN:  1 Diabetes without  complication. Diet controlled. Recent polyuria and polydipsia.  Gradually improving, though. Hemoglobin A1c and urine microalbumin/creatinine ordered today. He has appointment to establish care with endocrinology in about 3 months.  #2   iron deficiency anemia due to upper GI bleeding, history of. No melena.  Has been on every other day iron tab.   Due for CBC and iron panel to monitor this. Hemoccults were negative about 11 months ago. Of note, his next colorectal cancer screening is due 2026 per his GI provider with [facility].  3.  Hypertension, well-controlled on telmisartan 80 mg a day. Electrolytes and creatinine today.   4.  Hypercholesterolemia. Doing well on rosuvastatin 20 mg a day and Zetia 10 mg a day. Last LDL was 93 approximately 1 year ago. Lipid panel and hepatic panel today.  An After Visit Summary was printed and given to the patient.  FOLLOW UP: Return in about 3 months (around 03/01/2024) for routine chronic illness f/u.  Signed:  Santiago Bumpers, MD           11/30/2023

## 2023-12-01 LAB — MICROALBUMIN / CREATININE URINE RATIO
Creatinine,U: 47 mg/dL
Microalb Creat Ratio: 260.2 mg/g — ABNORMAL HIGH (ref 0.0–30.0)
Microalb, Ur: 12.2 mg/dL — ABNORMAL HIGH (ref 0.0–1.9)

## 2023-12-01 LAB — IRON,TIBC AND FERRITIN PANEL
%SAT: 40 % (ref 20–48)
Ferritin: 73 ng/mL (ref 38–380)
Iron: 142 ug/dL (ref 50–180)
TIBC: 358 ug/dL (ref 250–425)

## 2023-12-02 NOTE — Addendum Note (Signed)
 Addended by: Filomena Jungling on: 12/02/2023 10:35 AM   Modules accepted: Orders

## 2023-12-03 ENCOUNTER — Other Ambulatory Visit: Payer: Self-pay | Admitting: Family Medicine

## 2023-12-03 ENCOUNTER — Encounter: Payer: Self-pay | Admitting: Family Medicine

## 2023-12-03 DIAGNOSIS — E611 Iron deficiency: Secondary | ICD-10-CM

## 2023-12-03 DIAGNOSIS — R809 Proteinuria, unspecified: Secondary | ICD-10-CM

## 2023-12-03 NOTE — Progress Notes (Signed)
Spoke with PCP

## 2023-12-06 ENCOUNTER — Other Ambulatory Visit: Payer: Self-pay | Admitting: Family Medicine

## 2023-12-06 ENCOUNTER — Other Ambulatory Visit (HOSPITAL_BASED_OUTPATIENT_CLINIC_OR_DEPARTMENT_OTHER): Payer: Self-pay | Admitting: Cardiovascular Disease

## 2023-12-06 ENCOUNTER — Telehealth: Payer: Self-pay

## 2023-12-06 DIAGNOSIS — N183 Chronic kidney disease, stage 3 unspecified: Secondary | ICD-10-CM

## 2023-12-06 DIAGNOSIS — R801 Persistent proteinuria, unspecified: Secondary | ICD-10-CM

## 2023-12-06 DIAGNOSIS — N1831 Chronic kidney disease, stage 3a: Secondary | ICD-10-CM

## 2023-12-06 MED ORDER — EMPAGLIFLOZIN 10 MG PO TABS: 10.0000 mg | ORAL_TABLET | Freq: Every day | ORAL | 0 refills | Status: DC

## 2023-12-06 MED ORDER — ROSUVASTATIN CALCIUM 10 MG PO TABS: 10.0000 mg | ORAL_TABLET | Freq: Every day | ORAL | 1 refills | Status: DC

## 2023-12-06 NOTE — Telephone Encounter (Signed)
 Copied from CRM [id number]. Topic: Clinical - Lab/Test Results >> Dec 06, 2023  8:29 AM Adele Barthel wrote: Reason for CRM:   Has questions about recent bloodwork:   Protein in urine- prior blood work showed same thing, would like to speak about plan of action  Went to higher dosage of rosuvastatin and began to have signes of liver failure. So went to lower dosage. Would like to speak about the medication  Would like to speak about elevated blood sugar, still showing prediabetes.   CB#  [phone]

## 2023-12-06 NOTE — Telephone Encounter (Signed)
 Spoke with pt regarding appt, scheduled for 4/29

## 2023-12-06 NOTE — Telephone Encounter (Signed)
 OK I called pt today and discussed results. I've ordered future labs for him but pls call him and arrange office f/u in 1 mo and we'll repeat labs at that time. If he prefers just to do lab appt in 74mo instead of office visit then that is fine. thx

## 2023-12-07 ENCOUNTER — Other Ambulatory Visit (HOSPITAL_BASED_OUTPATIENT_CLINIC_OR_DEPARTMENT_OTHER): Payer: Self-pay | Admitting: Cardiovascular Disease

## 2023-12-09 ENCOUNTER — Other Ambulatory Visit (INDEPENDENT_AMBULATORY_CARE_PROVIDER_SITE_OTHER)

## 2023-12-09 ENCOUNTER — Other Ambulatory Visit

## 2023-12-09 DIAGNOSIS — N183 Chronic kidney disease, stage 3 unspecified: Secondary | ICD-10-CM | POA: Diagnosis not present

## 2023-12-09 DIAGNOSIS — E611 Iron deficiency: Secondary | ICD-10-CM

## 2023-12-09 DIAGNOSIS — R809 Proteinuria, unspecified: Secondary | ICD-10-CM

## 2023-12-09 LAB — BASIC METABOLIC PANEL WITH GFR
BUN: 25 mg/dL — ABNORMAL HIGH (ref 6–23)
CO2: 29 meq/L (ref 19–32)
Calcium: 9.7 mg/dL (ref 8.4–10.5)
Chloride: 100 meq/L (ref 96–112)
Creatinine, Ser: 1.37 mg/dL (ref 0.40–1.50)
GFR: 56.29 mL/min — ABNORMAL LOW (ref >60.00)
Glucose, Bld: 101 mg/dL — ABNORMAL HIGH (ref 70–99)
Potassium: 4.6 meq/L (ref 3.5–5.1)
Sodium: 136 meq/L (ref 135–145)

## 2023-12-09 LAB — CBC WITH DIFFERENTIAL/PLATELET
Basophils Absolute: 0 10*3/uL (ref 0.0–0.1)
Basophils Relative: 1.2 % (ref 0.0–3.0)
Eosinophils Absolute: 0.1 10*3/uL (ref 0.0–0.7)
Eosinophils Relative: 2.9 % (ref 0.0–5.0)
HCT: 47.8 % (ref 39.0–52.0)
Hemoglobin: 16.4 g/dL (ref 13.0–17.0)
Lymphocytes Relative: 38.6 % (ref 12.0–46.0)
Lymphs Abs: 1.3 10*3/uL (ref 0.7–4.0)
MCHC: 34.3 g/dL (ref 30.0–36.0)
MCV: 95.4 fl (ref 78.0–100.0)
Monocytes Absolute: 0.4 10*3/uL (ref 0.1–1.0)
Monocytes Relative: 11.4 % (ref 3.0–12.0)
Neutro Abs: 1.6 10*3/uL (ref 1.4–7.7)
Neutrophils Relative %: 45.9 % (ref 43.0–77.0)
Platelets: 149 10*3/uL — ABNORMAL LOW (ref 150.0–400.0)
RBC: 5 Mil/uL (ref 4.22–5.81)
RDW: 12.9 % (ref 11.5–15.5)
WBC: 3.4 10*3/uL — ABNORMAL LOW (ref 4.0–10.5)

## 2023-12-09 LAB — MICROALBUMIN / CREATININE URINE RATIO
Creatinine,U: 0 mg/dL
Microalb Creat Ratio: UNDETERMINED mg/g (ref 0.0–30.0)
Microalb, Ur: -0.1 mg/dL — ABNORMAL LOW (ref 0.0–1.9)

## 2023-12-10 ENCOUNTER — Encounter: Payer: Self-pay | Admitting: Family Medicine

## 2023-12-13 ENCOUNTER — Telehealth: Payer: Self-pay

## 2023-12-13 ENCOUNTER — Other Ambulatory Visit: Payer: Self-pay | Admitting: Family Medicine

## 2023-12-13 DIAGNOSIS — E1129 Type 2 diabetes mellitus with other diabetic kidney complication: Secondary | ICD-10-CM

## 2023-12-13 DIAGNOSIS — R809 Proteinuria, unspecified: Secondary | ICD-10-CM

## 2023-12-13 NOTE — Telephone Encounter (Signed)
 Copied from CRM [id number]. Topic: Clinical - Lab/Test Results >> Dec 13, 2023 12:21 PM Tiffany S wrote: Reason for CRM: Patient is requesting call back about the results that were inaccurate patient is confused and wants results explained   [facility] stated the patient's recent microalbumin will need to be recollected. Please review results and confirm if repeat needed at this time.

## 2023-12-13 NOTE — Telephone Encounter (Signed)
 See result note attached to labs from last week.

## 2023-12-14 ENCOUNTER — Other Ambulatory Visit

## 2023-12-14 DIAGNOSIS — R809 Proteinuria, unspecified: Secondary | ICD-10-CM

## 2023-12-14 NOTE — Addendum Note (Signed)
 Addended by: Emi Holes D on: 12/14/2023 01:09 PM   Modules accepted: Orders

## 2023-12-14 NOTE — Telephone Encounter (Signed)
 Please advise on message below.

## 2023-12-14 NOTE — Telephone Encounter (Signed)
 Source  Thomas Spencer (Patient)   Subject  Thomas Spencer (Patient)   Topic  General - Other    Communication  Reason for CRM: Patient would like to know if he could have atorvastatin  instead of rosuvastatin (CRESTOR) 10 MG tablet for one month before he starts Jardiance.And if there are no better results then he can start the jardiance.

## 2023-12-15 ENCOUNTER — Other Ambulatory Visit: Payer: Self-pay | Admitting: Family Medicine

## 2023-12-15 LAB — MICROALBUMIN / CREATININE URINE RATIO
Creatinine, Urine: 28 mg/dL (ref 20–320)
Microalb Creat Ratio: 211 mg/g{creat} — ABNORMAL HIGH (ref <30)
Microalb, Ur: 5.9 mg/dL

## 2023-12-15 MED ORDER — ATORVASTATIN CALCIUM 10 MG PO TABS: 10.0000 mg | ORAL_TABLET | Freq: Every day | ORAL | 1 refills | Status: DC

## 2023-12-15 NOTE — Telephone Encounter (Signed)
 Please notify him that his urine protein is 211, which is down slightly from 260 a couple weeks ago.   Yes, I think it is okay to do  atorvastatin 10 mg a day instead of the rosuvastatin and we can start Jardiance if hemoglobin A1c and urine protein are not improved in 3 months. I will send in the atorvastatin prescription now.

## 2023-12-17 ENCOUNTER — Other Ambulatory Visit (HOSPITAL_BASED_OUTPATIENT_CLINIC_OR_DEPARTMENT_OTHER): Payer: Self-pay | Admitting: Cardiovascular Disease

## 2023-12-21 ENCOUNTER — Ambulatory Visit: Admitting: Family Medicine

## 2023-12-21 ENCOUNTER — Encounter: Payer: Self-pay | Admitting: Family Medicine

## 2023-12-21 VITALS — BP 126/68 | HR 63 | Temp 98.1°F | Wt 159.0 lb

## 2023-12-21 DIAGNOSIS — M5431 Sciatica, right side: Secondary | ICD-10-CM | POA: Diagnosis not present

## 2023-12-21 DIAGNOSIS — M545 Low back pain, unspecified: Secondary | ICD-10-CM | POA: Diagnosis not present

## 2023-12-21 DIAGNOSIS — S39012A Strain of muscle, fascia and tendon of lower back, initial encounter: Secondary | ICD-10-CM

## 2023-12-21 MED ORDER — PREDNISONE 20 MG PO TABS
ORAL_TABLET | ORAL | 0 refills | Status: DC
Start: 2023-12-21 — End: 2024-01-19

## 2023-12-21 NOTE — Patient Instructions (Signed)
Lumbar Strain A lumbar strain, which is sometimes called a low-back strain, is a stretch or tear in a muscle or tendons in the lower back (lumbar spine). Tendons are the strong cords of tissue that attach muscle to bone. This type of injury occurs when muscles or tendons are torn or are stretched beyond their limits. Lumbar strains can range from mild to severe. Mild strains may involve stretching a muscle or tendon without tearing it. These may heal in 1-2 weeks. More severe strains involve tearing of muscle fibers or tendons. These will cause more pain and may take 6-8 weeks to heal. What are the causes? This condition may be caused by: Trauma, such as a fall or a hit to the body. Twisting or overstretching the back. This may result from doing activities that take a lot of energy, such as lifting heavy objects. What increases the risk? A lumbar strain is more common in: Athletes. People with obesity. People who do repeated lifting, bending, or other movements that involve their back. What are the signs or symptoms? Symptoms of this condition may include: Sharp or dull pain in the lower back that does not go away. The pain may spread to the buttocks. Stiffness or limited range of motion. Sudden muscle tightening (spasms). How is this diagnosed? This condition may be diagnosed based on: Your symptoms. Your medical history. A physical exam. Imaging tests, such as: X-rays. MRI. How is this treated? Treatment for this condition may include: Rest. Applying heat and cold to the affected area. Over-the-counter medicines to help with pain and inflammation, such as NSAIDs. Prescription medicine for pain or to relax the muscles. These may be needed for a short time. Physical therapy exercises to improve movement and strength. Follow these instructions at home: Managing pain, stiffness, and swelling     If told, put ice on the injured area during the first 24 hours after your injury. Put  ice in a plastic bag. Place a towel between your skin and the bag. Leave the ice on for 20 minutes, 2-3 times a day. If told, apply heat to the affected area as often as told by your health care provider. Use the heat source that your health care provider recommends, such as a moist heat pack or a heating pad. Place a towel between your skin and the heat source. Leave the heat on for 20-30 minutes. Remove the heat if your skin turns bright red. This is especially important if you are unable to feel pain, heat, or cold. You have a greater risk of getting burned. If your skin turns bright red, remove the ice or heat right away to prevent skin damage. The risk of damage is higher if you cannot feel pain, heat, or cold. Activity Rest and return to your normal activities as told by your health care provider. Ask your health care provider what activities are safe for you. Do exercises as told by your health care provider. General instructions Take over-the-counter and prescription medicines only as told by your health care provider. Ask your health care provider if the medicine prescribed to you: Requires you to avoid driving or using machinery. Can cause constipation. You may need to take these actions to prevent or treat constipation: Drink enough fluid to keep your urine pale yellow. Take over-the-counter or prescription medicines. Eat foods that are high in fiber, such as beans, whole grains, and fresh fruits and vegetables. Limit foods that are high in fat and processed sugars, such as fried or  sweet foods. Do not use any products that contain nicotine or tobacco. These products include cigarettes, chewing tobacco, and vaping devices, such as e-cigarettes. If you need help quitting, ask your health care provider. How is this prevented? To prevent a future low-back injury: Always warm up properly before physical activity or sports. Cool down and stretch after being active. Use correct form  when playing sports and lifting heavy objects. Bend your knees before you lift heavy objects. Use good posture when sitting and standing. Stay physically fit and keep a healthy weight. Do at least 150 minutes of moderate intensity exercise each week, such as brisk walking or water aerobics. Do strength exercises at least 2 times each week. Contact a health care provider if: Your back pain does not improve after several weeks of treatment. Your symptoms get worse. You have a fever. Get help right away if: Your back pain is severe. You are unable to stand or walk. You develop pain in your legs. You have weakness in your buttocks or legs. You have trouble controlling when you urinate or when you have a bowel movement. You have frequent, painful, or bloody urination. This information is not intended to replace advice given to you by your health care provider. Make sure you discuss any questions you have with your health care provider. Document Revised: 01/04/2023 Document Reviewed: 03/24/2022 Elsevier Patient Education  2024 [employer].

## 2023-12-21 NOTE — Progress Notes (Signed)
 Thomas Spencer , 09-Feb-1966, 58 y.o., male MRN: [id number] Patient Care Team    Relationship Specialty Notifications Start End  McGowen, Maryjean Morn, MD PCP - General Family Medicine  03/27/11   Chilton Si, MD PCP - Cardiology Cardiology  07/29/21   Hilarie Fredrickson, MD Consulting Physician Gastroenterology  09/19/19   Theodosia Blender, MD Consulting Physician Urology  03/08/23   Merlyn Lot, [place] Consulting Physician Urology  03/08/23    Comment: PA with Tristan Schroeder    Chief Complaint  Patient presents with   Back Pain    Pt hurt his back while gardening over the weekend; requesting referral to PT.      Subjective: Thomas Spencer is a 58 y.o. Pt presents for an OV with complaints of lower back pain of  days duration.  Associated symptoms include pain right lower back, mid-right buttock , radiates to below buttock.  Denies numbness/tingling or pain of lower extremity.  Denies bladder or bowel dysfx. . Pt has tried tylenol/advil to ease their symptoms.  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  Patient reports he really does not have much problems with his lower back except for on rare occasions and physical therapy and anti-inflammatories seem to work well for him.  MRI lumbar spine 05/2015: IMPRESSION: 1. 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 2. Mild left subarticular and foraminal narrowing at L4-5. 3. Mild right subarticular narrowing at L5-S1 secondary to a focal disc protrusion.       07/27/2023    9:06 AM 10/16/2022    3:10 PM 01/16/2022   10:33 AM 10/06/2021    8:15 AM 01/16/2021   11:08 AM  Depression screen PHQ 2/9  Decreased Interest 0 0 0 0 0  Down, Depressed, Hopeless 0 0 0 0 0  PHQ - 2 Score 0 0 0 0 0  Altered sleeping  0     Tired, decreased energy  1     Change in  appetite  0     Feeling bad or failure about yourself   0     Trouble concentrating  1     Moving slowly or fidgety/restless  0     Suicidal thoughts  0     PHQ-9 Score  2     Difficult doing work/chores  Not difficult at all       Allergies  Allergen Reactions   Ace Inhibitors Cough   Shrimp [Shellfish Allergy] Other (See Comments)    Swelling around eyes/lips---can eat IF cooked WELL   Thiazide-Type Diuretics     FATIGUE; DIZZINESS.     Social History   Social History Narrative   SH: Married, two teenagers (1 boy and 1 girl) as of 10/2015.   He is a Engineer, materials, lives in [place], [place].   He has a PhD in a vertical engineering any [employer] in intellectual property   No Tobacco, rare ETOH, no drug use.   Exercise occasionally - most days the week he goes to the gym.         Past Medical History:  Diagnosis Date   Acute epiglottitis 01/2014   BPH with obstruction/lower urinary tract symptoms    [facility] referral done 01/[facility] (Dr. Isabel Caprice); ultimately a prostate biopsy was done for  increased PSA velocity--April 2013--and it  was completely normal.   CAD S/P percutaneous coronary angioplasty 05/05/2017   Progressive Angina: (3 V CAD on Cor Calcium Score) --> LEFT HEART CATH & CORONARY ANGIOGRAPHY  / CORONARY STENT INTERVENTION Conclusion   LESION #1: p-mLAD to Mid LAD 80 %s --> PCI SYNERGY DES 2.75X16 - Tapered post-dilation: 3.1-2.9 mm.  LESION #2: pRI 85 % - PCI SYNERGY DES 3X12.  Initial plan ASA/Plavix x 1 yr  --> ASA stopped 06/2017 for GI Bleed-- on Plavix alone and continued w/this 11/2017   Chronic renal insufficiency, stage 3 (moderate) (HCC)    Diabetes mellitus without complication (HCC)    a1c 6.8% Dec 2023   Elevated liver function tests    mild   Epididymal cyst 09/2019   Right (vs spermatocele.  Hematocele less likely since no hx of trauma).   Erectile dysfunction    Family history of colon cancer    father   History of adenomatous polyp of colon 2007;  2014; 08/2019   Dr. Marina Goodell did 2020 scope.  Recall 2025.   History of sciatica 11/2013   responded well to prednisone burst 11/2013   Hyperlipidemia    On 40 mg Crestor as of 06/2017   Hypertension    On telmisartan   Iron deficiency anemia 06/2017   Hb drop from 15 down to 11 from 04/2017-06/2017.  +H pylori gastritis+.  Recurrence 01/2019 +correlation with melena and fatigue.  Pt has been back on iron x 2 wks and is making arrangements to see his GI MD as of 02/08/19 (? repeat EGD + is overdue for repeat colonoscopy). Hemoccults NEG X 3 Sept 2023.   Upper GI bleed 12/28/2022   bleeding duodenal AVM   UTI (urinary tract infection)    Enterococcus faecalis on urine clx Feb, Mar, and April 2024   Past Surgical History:  Procedure Laterality Date   CARDIOVASCULAR STRESS TEST  11/16/2019   Normal EF, no ischemia   COLONOSCOPY W/ POLYPECTOMY  09/02/06; 07/2013; 08/2019   2014 [facility]: adenomatous polyp w/out high grade dysplasia or malignancy.  08/2019 adenoma x 2, diverticulosis, recall 5 yrs.   coronary calcium score     Very high risk: as of 03/2017 pt is to return to discuss with Dr. Herbie Baltimore: stress test vs cath as next step.   CORONARY STENT INTERVENTION N/A 05/05/2017   DES x 2. Preserved LV function.  Procedure: CORONARY STENT INTERVENTION;  Surgeon: Marykay Lex, MD;  Location: [facility] INVASIVE CV LAB;  Service: Cardiovascular;: CAD S/P PCI: p-mLAD Synergy DES 2.75 x 16 (3.1-2.9 mm), p-mRI Synergy DES 3.0 x 12 (3.3 mm)   dental implants     ESOPHAGOGASTRODUODENOSCOPY  07/08/2017   06/2017 GI RECOMMENDED D/C ASPIRIN:  superfic non-bleeding ulcers ranging 5-82mm in size in antrum.  +H pylori +.  A few non-bleeding erosions in pre-pyloric region.  Normal duodenum.  12/2022 bleeding duodenal AVM   LEFT HEART CATH AND CORONARY ANGIOGRAPHY N/A 05/05/2017   Procedure: LEFT HEART CATH AND CORONARY ANGIOGRAPHY;  Surgeon: Marykay Lex, MD;  Location: [facility] INVASIVE CV LAB;  Service: Cardiovascular;   Laterality: N/A; p-m LAD 80%, mRI 90% --> 2 vessel PCI   NM MYOVIEW LTD  11/16/2019   Fixed basal inferoseptal and mid anteroseptal perfusion defect consistent with subendocardial scar, NO ISCHEMIA.  EF 52%.  Unable to interpret EKG.  LOW Risk.   PROSTATE BIOPSY  01/2012   Normal   Family History  Problem Relation Age  of Onset   Heart attack Mother 2   Heart disease Mother 70   Hyperlipidemia Mother    Hypertension Mother    Valvular heart disease Mother 66       s/p MVR-CABG   Cancer Father        colon   Cancer Sister        cervical/ remission   Diabetes Sister        type 2   Hypertension Sister    Hypertension Sister    Diabetes Sister        type 2   Hyperlipidemia Brother    Hypertension Brother    Coronary artery disease Brother 48       CABG x 4   CAD Brother    Allergies as of 12/21/2023       Reactions   Ace Inhibitors Cough   Shrimp [shellfish Allergy] Other (See Comments)   Swelling around eyes/lips---can eat IF cooked WELL   Thiazide-type Diuretics    FATIGUE; DIZZINESS.        Medication List        Accurate as of December 21, 2023 12:04 PM. If you have any questions, ask your nurse or doctor.          atorvastatin 10 MG tablet Commonly known as: LIPITOR Take 1 tablet (10 mg total) by mouth daily.   clopidogrel 75 MG tablet Commonly known as: PLAVIX TAKE 1 TABLET BY MOUTH EVERY DAY   empagliflozin 10 MG Tabs tablet Commonly known as: Jardiance Take 1 tablet (10 mg total) by mouth daily before breakfast.   ezetimibe 10 MG tablet Commonly known as: ZETIA TAKE 1 TABLET BY MOUTH EVERY DAY   ferrous sulfate 325 (65 FE) MG tablet Take 325 mg by mouth 2 (two) times daily.   multivitamin with minerals Tabs tablet Take 1 tablet by mouth daily.   pantoprazole 40 MG tablet Commonly known as: PROTONIX TAKE 1 TABLET BY MOUTH TWICE A DAY   predniSONE 20 MG tablet Commonly known as: DELTASONE 60 mg x3d, 40 mg x3d, 20 mg x2d, 10 mg  x2d Started by: Felix Pacini   sildenafil 100 MG tablet Commonly known as: VIAGRA TAKE 1 TABLET BY MOUTH ONCE DAILY AS NEEDED FOR ERECTILE DYSFUNCTION   tamsulosin 0.4 MG Caps capsule Commonly known as: FLOMAX Take 1 capsule (0.4 mg total) by mouth daily.   telmisartan 80 MG tablet Commonly known as: MICARDIS TAKE 1 TABLET BY MOUTH EVERY DAY        All past medical history, surgical history, allergies, family history, immunizations andmedications were updated in the EMR today and reviewed under the history and medication portions of their EMR.     ROS Negative, with the exception of above mentioned in HPI   Objective:  BP 126/68   Pulse 63   Temp 98.1 F (36.7 C)   Wt 159 lb (72.1 kg)   SpO2 97%   BMI 27.29 kg/m  Body mass index is 27.29 kg/m. Physical Exam Vitals and nursing note reviewed.  Constitutional:      General: He is not in acute distress.    Appearance: Normal appearance. He is not ill-appearing, toxic-appearing or diaphoretic.     Comments: Very pleasant  Gait is slow and  guarded  HENT:     Head: Normocephalic and atraumatic.  Eyes:     General: No scleral icterus.       Right eye: No discharge.  Left eye: No discharge.     Extraocular Movements: Extraocular movements intact.     Pupils: Pupils are equal, round, and reactive to light.  Musculoskeletal:     Lumbar back: Spasms and tenderness present. No bony tenderness. Decreased range of motion. Negative right straight leg raise test and negative left straight leg raise test.       Back:     Right lower leg: No edema.     Left lower leg: No edema.     Comments: TTP right SI and lower lumbar paraspinal muscles. Muscle spasm present. MS 5/5 b/l LE, right hip flexion limited by pain. DTR equal b/l LE. NV intact distally.   Skin:    General: Skin is warm and dry.     Coloration: Skin is not jaundiced or pale.     Findings: No rash.  Neurological:     Mental Status: He is alert and oriented  to person, place, and time. Mental status is at baseline.  Psychiatric:        Mood and Affect: Mood normal.        Behavior: Behavior normal.        Thought Content: Thought content normal.        Judgment: Judgment normal.     No results found. No results found. No results found for this or any previous visit (from the past 24 hours).  Assessment/Plan: Thomas Spencer is a 58 y.o. male present for OV for  Lumbar back pain (Primary)/ Lumbar strain, initial encounter/sciatica-right Rest. Heat application Tylenol for discomfort. Pt declined muscle relaxant script today Referral to PT placed to OR - Ambulatory referral to Physical Therapy - predniSONE (DELTASONE) 20 MG tablet; 60 mg x3d, 40 mg x3d, 20 mg x2d, 10 mg x2d  Dispense: 18 tablet; Refill: 0 - pt was encouraged to take it easy next few days. If pain worsens or radiates to lower ext, follow up recommended.    Reviewed expectations re: course of current medical issues. Discussed self-management of symptoms. Outlined signs and symptoms indicating need for more acute intervention. Patient verbalized understanding and all questions were answered. Patient received an After-Visit Summary.    Orders Placed This Encounter  Procedures   Ambulatory referral to Physical Therapy   Meds ordered this encounter  Medications   predniSONE (DELTASONE) 20 MG tablet    Sig: 60 mg x3d, 40 mg x3d, 20 mg x2d, 10 mg x2d    Dispense:  18 tablet    Refill:  0   Referral Orders         Ambulatory referral to Physical Therapy      Return in about 4 weeks (around 01/18/2024), or if symptoms worsen or fail to improve.  Note is dictated utilizing voice recognition software. Although note has been proof read prior to signing, occasional typographical errors still can be missed. If any questions arise, please do not hesitate to call for verification.   electronically signed by:  Felix Pacini, DO  [facility] - OR

## 2024-01-03 ENCOUNTER — Other Ambulatory Visit (HOSPITAL_BASED_OUTPATIENT_CLINIC_OR_DEPARTMENT_OTHER): Payer: Self-pay

## 2024-01-03 MED ORDER — TELMISARTAN 80 MG PO TABS: 80.0000 mg | ORAL_TABLET | Freq: Every day | ORAL | 0 refills | Status: DC

## 2024-01-04 ENCOUNTER — Ambulatory Visit: Admitting: Family Medicine

## 2024-01-04 ENCOUNTER — Encounter: Payer: Self-pay | Admitting: Family Medicine

## 2024-01-04 VITALS — BP 115/76 | HR 82 | Temp 98.0°F | Wt 155.2 lb

## 2024-01-04 DIAGNOSIS — R809 Proteinuria, unspecified: Secondary | ICD-10-CM

## 2024-01-04 DIAGNOSIS — Z7984 Long term (current) use of oral hypoglycemic drugs: Secondary | ICD-10-CM | POA: Diagnosis not present

## 2024-01-04 DIAGNOSIS — E1129 Type 2 diabetes mellitus with other diabetic kidney complication: Secondary | ICD-10-CM | POA: Diagnosis not present

## 2024-01-04 DIAGNOSIS — N1831 Chronic kidney disease, stage 3a: Secondary | ICD-10-CM | POA: Diagnosis not present

## 2024-01-04 DIAGNOSIS — R682 Dry mouth, unspecified: Secondary | ICD-10-CM | POA: Diagnosis not present

## 2024-01-04 LAB — BASIC METABOLIC PANEL WITH GFR
BUN: 20 mg/dL (ref 6–23)
CO2: 30 meq/L (ref 19–32)
Calcium: 9.2 mg/dL (ref 8.4–10.5)
Chloride: 101 meq/L (ref 96–112)
Creatinine, Ser: 1.38 mg/dL (ref 0.40–1.50)
GFR: 55.78 mL/min — ABNORMAL LOW (ref >60.00)
Glucose, Bld: 110 mg/dL — ABNORMAL HIGH (ref 70–99)
Potassium: 4.4 meq/L (ref 3.5–5.1)
Sodium: 136 meq/L (ref 135–145)

## 2024-01-04 NOTE — Progress Notes (Signed)
 OFFICE VISIT  01/10/2024  CC:  Chief Complaint  Patient presents with   Medical Management of Chronic Issues    Patient is a 58 y.o. male who presents for 1 month follow-up diabetes and microalbuminuria.  INTERIM HX: No problems on the atorvastatin . He is holding off on starting the Jardiance  until we get results of his urine protein test today.  His biggest concern is ongoing dry mouth and throat and a stinging feeling of his lips.  This has been going on more than a month.  He has some days when it is worse than others, seems to be worse on days when he works in the yard.  He has no classic allergy symptoms such as nasal congestion, postnasal drip, sneezing, or eye itching.  His eyes are not dry. When the symptoms are prominent he feels drained and energy. No fevers, no joint aches, no rash, no pain in the mouth or throat. The dryness sometimes gets to the point of affecting the voice--> raspy.  He does endorse being very thirsty but it is hard to tell if this is feeling of dryness that he is trying to quench or actual thirst. His sugars have had some mild elevations but nothing severely elevated and nothing persistent. Most recent A1c about a month ago was 6.0%.   Past Medical History:  Diagnosis Date   Acute epiglottitis 01/2014   BPH with obstruction/lower urinary tract symptoms    [facility] referral done 01/[facility] (Dr. Bosie Bye); ultimately a prostate biopsy was done for increased PSA velocity--April 2013--and it  was completely normal.   CAD S/P percutaneous coronary angioplasty 05/05/2017   Progressive Angina: (3 V CAD on Cor Calcium  Score) --> LEFT HEART CATH & CORONARY ANGIOGRAPHY  / CORONARY STENT INTERVENTION Conclusion   LESION #1: p-mLAD to Mid LAD 80 %s --> PCI SYNERGY DES 2.75X16 - Tapered post-dilation: 3.1-2.9 mm.  LESION #2: pRI 85 % - PCI SYNERGY DES 3X12.  Initial plan ASA/Plavix  x 1 yr  --> ASA stopped 06/2017 for GI Bleed-- on Plavix  alone and continued w/this  11/2017   Chronic renal insufficiency, stage 3 (moderate) (HCC)    Diabetes mellitus without complication (HCC)    a1c 6.8% Dec 2023   Elevated liver function tests    mild   Epididymal cyst 09/2019   Right (vs spermatocele.  Hematocele less likely since no hx of trauma).   Erectile dysfunction    Family history of colon cancer    father   History of adenomatous polyp of colon 2007; 2014; 08/2019   Dr. Elvin Hammer did 2020 scope.  Recall 2025.   History of sciatica 11/2013   responded well to prednisone  burst 11/2013   Hyperlipidemia    On 40 mg Crestor  as of 06/2017   Hypertension    On telmisartan    Iron deficiency anemia 06/2017   Hb drop from 15 down to 11 from 04/2017-06/2017.  +H pylori gastritis+.  Recurrence 01/2019 +correlation with melena and fatigue.  Pt has been back on iron x 2 wks and is making arrangements to see his GI MD as of 02/08/19 (? repeat EGD + is overdue for repeat colonoscopy). Hemoccults NEG X 3 Sept 2023.   Upper GI bleed 12/28/2022   bleeding duodenal AVM   UTI (urinary tract infection)    Enterococcus faecalis on urine clx Feb, Mar, and April 2024    Past Surgical History:  Procedure Laterality Date   CARDIOVASCULAR STRESS TEST  11/16/2019   Normal EF, no  ischemia   COLONOSCOPY W/ POLYPECTOMY  09/02/06; 07/2013; 08/2019   2014 [facility]: adenomatous polyp w/out high grade dysplasia or malignancy.  08/2019 adenoma x 2, diverticulosis, recall 5 yrs.   coronary calcium  score     Very high risk: as of 03/2017 pt is to return to discuss with Dr. Addie Holstein: stress test vs cath as next step.   CORONARY STENT INTERVENTION N/A 05/05/2017   DES x 2. Preserved LV function.  Procedure: CORONARY STENT INTERVENTION;  Surgeon: Arleen Lacer, MD;  Location: [facility] INVASIVE CV LAB;  Service: Cardiovascular;: CAD S/P PCI: p-mLAD Synergy DES 2.75 x 16 (3.1-2.9 mm), p-mRI Synergy DES 3.0 x 12 (3.3 mm)   dental implants     ESOPHAGOGASTRODUODENOSCOPY  07/08/2017   06/2017 GI  RECOMMENDED D/C ASPIRIN :  superfic non-bleeding ulcers ranging 5-13mm in size in antrum.  +H pylori +.  A few non-bleeding erosions in pre-pyloric region.  Normal duodenum.  12/2022 bleeding duodenal AVM   LEFT HEART CATH AND CORONARY ANGIOGRAPHY N/A 05/05/2017   Procedure: LEFT HEART CATH AND CORONARY ANGIOGRAPHY;  Surgeon: Arleen Lacer, MD;  Location: [facility] INVASIVE CV LAB;  Service: Cardiovascular;  Laterality: N/A; p-m LAD 80%, mRI 90% --> 2 vessel PCI   NM MYOVIEW  LTD  11/16/2019   Fixed basal inferoseptal and mid anteroseptal perfusion defect consistent with subendocardial scar, NO ISCHEMIA.  EF 52%.  Unable to interpret EKG.  LOW Risk.   PROSTATE BIOPSY  01/2012   Normal    Outpatient Medications Prior to Visit  Medication Sig Dispense Refill   atorvastatin  (LIPITOR) 10 MG tablet Take 1 tablet (10 mg total) by mouth daily. 90 tablet 1   clopidogrel  (PLAVIX ) 75 MG tablet TAKE 1 TABLET BY MOUTH EVERY DAY 90 tablet 3   empagliflozin  (JARDIANCE ) 10 MG TABS tablet Take 1 tablet (10 mg total) by mouth daily before breakfast. 30 tablet 0   ezetimibe  (ZETIA ) 10 MG tablet TAKE 1 TABLET BY MOUTH EVERY DAY 30 tablet 0   ferrous sulfate 325 (65 FE) MG tablet Take 325 mg by mouth 2 (two) times daily.     Multiple Vitamin (MULTIVITAMIN WITH MINERALS) TABS tablet Take 1 tablet by mouth daily.     pantoprazole  (PROTONIX ) 40 MG tablet TAKE 1 TABLET BY MOUTH TWICE A DAY 180 tablet 0   predniSONE  (DELTASONE ) 20 MG tablet 60 mg x3d, 40 mg x3d, 20 mg x2d, 10 mg x2d 18 tablet 0   sildenafil  (VIAGRA ) 100 MG tablet TAKE 1 TABLET BY MOUTH ONCE DAILY AS NEEDED FOR ERECTILE DYSFUNCTION 10 tablet 5   tamsulosin  (FLOMAX ) 0.4 MG CAPS capsule Take 1 capsule (0.4 mg total) by mouth daily. 90 capsule 3   telmisartan  (MICARDIS ) 80 MG tablet Take 1 tablet (80 mg total) by mouth daily. 15 tablet 0   No facility-administered medications prior to visit.    Allergies  Allergen Reactions   Ace Inhibitors Cough   Shrimp  [Shellfish Allergy] Other (See Comments)    Swelling around eyes/lips---can eat IF cooked WELL   Thiazide-Type Diuretics     FATIGUE; DIZZINESS.      Review of Systems As per HPI  PE:    01/04/2024    9:03 AM 12/21/2023   11:36 AM 11/30/2023    8:01 AM  Vitals with BMI  Height   5\' 4"   Weight 155 lbs 3 oz 159 lbs 159 lbs 6 oz  BMI  27.28 27.35  Systolic 115 126 409  Diastolic 76 68 80  Pulse 82 63 62     Physical Exam  Gen: Alert, well appearing.  Patient is oriented to person, place, time, and situation. AFFECT: pleasant, lucid thought and speech. Lips without focal lesion.  No peeling or erythema or fissuring. Buccal mucosa, gingiva, tongue, and palate and posterior pharynx all appear pink and moist, no focal lesion or swelling.  LABS:  Last CBC Lab Results  Component Value Date   WBC 3.4 (L) 12/09/2023   HGB 16.4 12/09/2023   HCT 47.8 12/09/2023   MCV 95.4 12/09/2023   MCH 26.6 (L) 01/08/2023   RDW 12.9 12/09/2023   PLT 149.0 (L) 12/09/2023   Lab Results  Component Value Date   IRON 142 11/30/2023   TIBC 358 11/30/2023   FERRITIN 73 11/30/2023   Last metabolic panel Lab Results  Component Value Date   GLUCOSE 110 (H) 01/04/2024   NA 136 01/04/2024   K 4.4 01/04/2024   CL 101 01/04/2024   CO2 30 01/04/2024   BUN 20 01/04/2024   CREATININE 1.38 01/04/2024   GFR 55.78 (L) 01/04/2024   CALCIUM  9.2 01/04/2024   PROT 7.4 11/30/2023   ALBUMIN 4.7 11/30/2023   LABGLOB 2.6 12/30/2021   AGRATIO 1.7 12/30/2021   BILITOT 0.5 11/30/2023   ALKPHOS 53 11/30/2023   AST 27 11/30/2023   ALT 29 11/30/2023   ANIONGAP 7 05/06/2017   Last lipids Lab Results  Component Value Date   CHOL 107 11/30/2023   HDL 30.70 (L) 11/30/2023   LDLCALC 59 11/30/2023   LDLDIRECT 72.0 12/08/2016   TRIG 88.0 11/30/2023   CHOLHDL 3 11/30/2023   Last hemoglobin A1c Lab Results  Component Value Date   HGBA1C 6.0 11/30/2023   IMPRESSION AND PLAN:  #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 Recheck microalb/cr and bmet today. Last A1c 6%. We discussed adding jardiance  in light of #1 and his hx of CAD but he is holding off for now. Next A1c around 03/01/24.  #2 Hyperchol. Recent switch from rosuvastatin  to atorvastatin  about 3 wks ago. Plan recheck lipids about 2-3 mo. Cont zetia .  #3 Dry mouth/thirst. Unknown etiology. No hyperglycemia. Check sjogren's ab panel today.  An After Visit Summary was printed and given to the patient.  FOLLOW UP: Return in about 4 weeks (around 02/01/2024) for f/u dry mouth/throat.  Signed:  Arletha Lady, MD           01/10/2024

## 2024-01-04 NOTE — Patient Instructions (Signed)
Sjogren's syndrome

## 2024-01-05 ENCOUNTER — Encounter: Payer: Self-pay | Admitting: Family Medicine

## 2024-01-05 LAB — SJOGRENS SYNDROME-B EXTRACTABLE NUCLEAR ANTIBODY: SSB (La) (ENA) Antibody, IgG: <1 AI

## 2024-01-05 LAB — SJOGRENS SYNDROME-A EXTRACTABLE NUCLEAR ANTIBODY: SSA (Ro) (ENA) Antibody, IgG: <1 AI

## 2024-01-05 LAB — MICROALBUMIN / CREATININE URINE RATIO
Creatinine, Urine: 77 mg/dL (ref 20–320)
Microalb Creat Ratio: 125 mg/g{creat} — ABNORMAL HIGH (ref <30)
Microalb, Ur: 9.6 mg/dL

## 2024-01-06 ENCOUNTER — Other Ambulatory Visit: Payer: Self-pay

## 2024-01-06 MED ORDER — TELMISARTAN 80 MG PO TABS: 80.0000 mg | ORAL_TABLET | Freq: Every day | ORAL | 0 refills | Status: DC

## 2024-01-07 ENCOUNTER — Other Ambulatory Visit (HOSPITAL_BASED_OUTPATIENT_CLINIC_OR_DEPARTMENT_OTHER): Payer: Self-pay | Admitting: Cardiovascular Disease

## 2024-01-09 ENCOUNTER — Other Ambulatory Visit: Payer: Self-pay | Admitting: Family Medicine

## 2024-01-11 ENCOUNTER — Ambulatory Visit: Admitting: Family Medicine

## 2024-01-15 ENCOUNTER — Other Ambulatory Visit (HOSPITAL_BASED_OUTPATIENT_CLINIC_OR_DEPARTMENT_OTHER): Payer: Self-pay | Admitting: Cardiovascular Disease

## 2024-01-24 ENCOUNTER — Encounter: Payer: Managed Care, Other (non HMO) | Admitting: Family Medicine

## 2024-02-04 ENCOUNTER — Ambulatory Visit (INDEPENDENT_AMBULATORY_CARE_PROVIDER_SITE_OTHER): Admitting: Family

## 2024-02-04 ENCOUNTER — Encounter (HOSPITAL_BASED_OUTPATIENT_CLINIC_OR_DEPARTMENT_OTHER): Payer: Self-pay | Admitting: Family

## 2024-02-04 VITALS — BP 118/70 | HR 60 | Ht 64.0 in | Wt 157.1 lb

## 2024-02-04 DIAGNOSIS — E119 Type 2 diabetes mellitus without complications: Secondary | ICD-10-CM | POA: Diagnosis not present

## 2024-02-04 DIAGNOSIS — I2510 Atherosclerotic heart disease of native coronary artery without angina pectoris: Secondary | ICD-10-CM | POA: Diagnosis not present

## 2024-02-04 DIAGNOSIS — Z9861 Coronary angioplasty status: Secondary | ICD-10-CM

## 2024-02-04 DIAGNOSIS — I10 Essential (primary) hypertension: Secondary | ICD-10-CM

## 2024-02-04 DIAGNOSIS — E785 Hyperlipidemia, unspecified: Secondary | ICD-10-CM

## 2024-02-04 MED ORDER — EZETIMIBE 10 MG PO TABS: 10.0000 mg | ORAL_TABLET | Freq: Every day | ORAL | 3 refills | Status: DC

## 2024-02-04 NOTE — Progress Notes (Signed)
 Cardiology Office Note:  .   Date:  02/04/2024  ID:  Thomas Spencer, DOB 07-Nov-1965, MRN [id number] PCP: Shelvia Dick, MD  [facility] Providers Cardiologist:  Maudine Sos, MD    History of Present Illness: Thomas Spencer   Thomas Spencer is a 58 y.o. male with history of CAD s/p PCI, HTN, HLD, DM2.   Prior LHC 04/2017 severe two-vessel disease in mid LAD and proximal ramus intermedius Thomas Spencer treated with DES.  Stress test 11/2019 low risk.  Lasting 08/03/2022.  He was feeling overall well from a cardiac perspective.  BP was well-controlled on telmisartan .  Lipids were well-controlled on rosuvastatin , Zetia .  Repatha had been previously trialed for insurance approval but required trial of Zetia  first.  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  Discharged on PPI twice daily.  Discussed the use of AI scribe software for clinical note transcription with the patient, who gave verbal consent to proceed.  History of Present Illness Thomas Spencer is a 58 year old male with hyperlipidemia who presents for cardiovascular follow-up.  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  Since last seen his rosuvastatin  was transition to atorvastatin .  Notes report due to liver trouble though LFT on my review normal and he reports was changed due to renal function.  He experiences occasional lightheadedness upon standing quickly, which may be related to orthostatic hypotension or dehydration. Enjoys working in his vegetable garden in his spare time.  Blood sugar levels are elevated, and he is considering Jardiance  (prescribed but not yet started) and interested in consulting a registered dietitian. He exercises regularly using a home gym, focusing on aerobics, and is considering increasing strength training. He is proactive about health, engages in regular physical activity,  and maintains a balanced diet.    ROS: Please see the history of present illness.    All other systems reviewed and are negative.   Studies Reviewed: .        Cardiac Studies & Procedures   ______________________________________________________________________________________________ CARDIAC CATHETERIZATION  CARDIAC CATHETERIZATION 05/05/2017  Conclusion Images from the original result were not included.   LESION #1: Prox LAD to Mid LAD lesion, 80 %stenosed.  A STENT SYNERGY DES [id number] drug eluting stent was successfully placed. - Postdilated and tapered fashion to 3.1-2.9 mm  Post intervention, there is a 0% residual stenosis.  LESION #2: Ramus lesion, 85 %stenosed.  A STENT SYNERGY DES 3X12 drug eluting stent was successfully placed. Postdilated to 3.3 mm  Post intervention, there is a 0% residual stenosis.  There is hyperdynamic left ventricular systolic function. The left ventricular ejection fraction is greater than 65% by visual estimate.  LV end diastolic pressure is normal.  Severe 2 vessel disease involving the mid LAD and proximal ramus intermedius treated with 2 DES stents. Otherwise mild to moderate disease elsewhere. Hyperdynamic left ventricle with normal LVEDP.  Plan:  Overnight monitoring and post procedure unit with TR band removal per protocol. - With 2 vessel PCI, not appropriate for same-day discharge  Dual and platelet therapy for minimum one year with aspirin  and Plavix   Increase simvastatin  to 40 mm daily  Continue ARB, but no beta blocker due to bradycardia  Likely discharge tomorrow.  Follow-up with me OR APP in roughly 2 weeks   Randene Bustard, M.D., M.S. Interventional Cardiologist  Pager # [phone] Phone # [phone] [address]  [address] [place], [place] [address]  Findings Coronary Findings Diagnostic  Dominance: Right  Left Main Vessel is large. Vessel is angiographically normal.  Left Anterior Descending The  lesion is segmental, eccentric and irregular.  First Diagonal Branch Vessel is small in size.  First Septal Branch Vessel is small in size.  Second Diagonal Branch Vessel is moderate in size.  Second Septal Branch Vessel is small in size.  Ramus Intermedius The lesion is focal, eccentric and irregular.  Lateral Ramus Intermedius Vessel is moderate in size.  Left Circumflex  First Obtuse Marginal Branch Vessel is moderate in size. The lesion is tubular and smooth.  Right Coronary Artery Vessel is large. There is mild  diffuse disease throughout the vessel. The vessel is mildly tortuous. The lesion is tubular, eccentric and smooth.  Acute Marginal Branch Vessel is small in size.  Right Posterior Descending Artery The vessel exhibits minimal luminal irregularities.  Inferior Septal Vessel is small in size.  Right Posterior Atrioventricular Artery Vessel is moderate in size.  First Right Posterolateral Branch There is mild disease in the vessel.  Intervention  Prox LAD to Mid LAD lesion Angioplasty Lesion length: 14 mm. Lesion crossed with guidewire using a WIRE MARVEL STR TIP 190CM. Pre-stent angioplasty was performed using a BALLOON SAPPHIRE 2.5X12. Maximum pressure: 10 atm. Inflation time: 20 sec. A STENT SYNERGY DES [id number] drug eluting stent was successfully placed. Minimum lumen area: 3.1 mm. Stent strut is well apposed. Post-stent angioplasty was performed using a BALLOON SAPPHIRE [place] 3.0X12. Maximum pressure: 18 atm. Inflation time: 30 sec. The pre-interventional distal flow is normal (TIMI 3).  The post-interventional distal flow is normal (TIMI 3). The intervention was successful . No complications occurred at this lesion. CATH VISTA GUIDE 6FR XBLAD3.5. There is a 0% residual stenosis post intervention.  Ramus lesion Angioplasty Lesion length: 10 mm. Lesion crossed with guidewire using a WIRE MARVEL STR TIP 190CM. Pre-stent angioplasty was performed using a  BALLOON SAPPHIRE 2.5X12. Maximum pressure: 10 atm. Inflation time: 20 sec. A STENT SYNERGY DES 3X12 drug eluting stent was successfully placed. Minimum lumen area: 3.3 mm. Stent strut is well apposed. Post-stent angioplasty was performed using a STENT SYNERGY DES 3X12. Maximum pressure: 18 atm. Inflation time: 30 sec. With Stent Balloon. The pre-interventional distal flow is normal (TIMI 3).  The post-interventional distal flow is normal (TIMI 3). The intervention was successful . No complications occurred at this lesion. CATH VISTA GUIDE 6FR XBLAD3.5 There is a 0% residual stenosis post intervention.   STRESS TESTS  MYOCARDIAL PERFUSION IMAGING 11/16/2019  Narrative  EKG nondiagnostic due to baseline ST Changes  Myovue scan with probable normal perfusion, cannot exclude small region of subendocardial scar (anteroseptal, mid). No significant ischemia  LVEF 52%  Low risk scan        CT SCANS  CT CARDIAC SCORING (SELF PAY ONLY) 03/26/2017  Addendum 03/26/2017  3:27 PM ADDENDUM REPORT: 03/26/2017 15:24  CLINICAL DATA:  Risk stratification  EXAM: Coronary Calcium  Score  TECHNIQUE: The patient was scanned on a Siemens Somatom 64 slice scanner. Axial non-contrast 3 mm slices were carried out through the heart. The data set was analyzed on a dedicated work station and scored using the Agatson method.  FINDINGS: Non-cardiac: See separate report from [facility].  Ascending Aorta:  3.3 cm  Pericardium: Normal  Coronary arteries: Significant 3 vessel coronary calcium  especially the proximal / mid LAD and mid RCA  IMPRESSION: Coronary calcium  score of 343 . This was 95 th percentile  for age and sex matched control.  Janelle Mediate   Electronically Signed By: Janelle Mediate M.D. On: 03/26/2017 15:24  Narrative EXAM: OVER-READ INTERPRETATION  CT CHEST  The following report is an over-read performed by radiologist Dr. Asenath Blacker [facility] on  03/26/2017. This over-read does not include interpretation of cardiac or coronary anatomy or pathology. The coronary calcium  score interpretation by the cardiologist is attached.  COMPARISON:  None.  FINDINGS: Cardiovascular: Heart is normal size. Visualized aorta normal caliber.  Mediastinum/Nodes: No adenopathy in the lower mediastinum or hila.  Lungs/Pleura: Visualized lungs are clear.  No effusions.  Upper Abdomen: Imaging into the upper abdomen shows no acute findings.  Musculoskeletal: Chest wall soft tissues and visualized bony structures unremarkable.  IMPRESSION: No acute or significant extracardiac abnormality.  Electronically Signed: By: Janeece Mechanic M.D. On: 03/26/2017 09:40     ______________________________________________________________________________________________      Risk Assessment/Calculations:             Physical Exam:   VS:  BP 118/70 (BP Location: Left Arm, Patient Position: Sitting, Cuff Size: Normal)   Pulse 60   Ht 5\' 4"  (1.626 m)   Wt 157 lb 1.6 oz (71.3 kg)   SpO2 99%   BMI 26.97 kg/m    Wt Readings from Last 3 Encounters:  02/04/24 157 lb 1.6 oz (71.3 kg)  01/04/24 155 lb 3.2 oz (70.4 kg)  12/21/23 159 lb (72.1 kg)    GEN: Well nourished, well developed in no acute distress NECK: No JVD; No carotid bruits CARDIAC: RRR, no murmurs, rubs, gallops RESPIRATORY:  Clear to auscultation without rales, wheezing or rhonchi  ABDOMEN: Soft, non-tender, non-distended EXTREMITIES:  No edema; No deformity   ASSESSMENT AND PLAN: .    Assessment and Plan Assessment & Plan Hyperlipidemia, LDL goal <70 Switched from Rosuvastatin  to atorvastatin  due to elevated creatinine per his report. Discussed PCSK9 inhibitors as an option if oral medications fail, pending insurance approval. Explained PCSK9 inhibitors' mechanism. - Order lipid panel, CMET, and lipoprotein A. - Send note to Dr. Ledon Pry for lab coordination. - Consider PCSK9 inhibitors  if LDL not at goal <70.  CAD Stable with no anginal symptoms. No indication for ischemic evaluation.  GDMT includes Plavix  75 mg daily, Zetia  10 mg daily, atorvastatin  10 mg daily. -Recommend aiming for 150 minutes of moderate intensity activity per week and following a heart healthy diet.    HTN BP well controlled. Continue current antihypertensive regimen.    DM2 Discussed Jardiance  benefits and risks. Has not yet started as prescribed by PCP but considering.  - Refer to registered dietitian for dietary management.  Orthostatic hypotension Intermittent lightheadedness likely due to orthostatic hypotension. Discussed dehydration and rapid position changes as factors. No near syncope, syncope. Recommend staying hydrated, making position changes slowly.           Dispo: follow up in 1 year  Signed, Clearnce Curia, NP

## 2024-02-04 NOTE — Patient Instructions (Signed)
 Medication Instructions:  Continue your current medications.  *If you need a refill on your cardiac medications before your next appointment, please call your pharmacy*  Lab Work: Your physician recommends that you return for lab work one day next week for fasting labs (lipid panel, CMET, lipoprotein a) If you have labs (blood work) drawn today and your tests are completely normal, you will receive your results only by: MyChart Message (if you have MyChart) OR A paper copy in the mail If you have any lab test that is abnormal or we need to change your treatment, we will call you to review the results.  Testing/Procedures: Your EKG today looked great!  Follow-Up: At [facility], you and your health needs are our priority.  As part of our continuing mission to provide you with exceptional heart care, our providers are all part of one team.  This team includes your primary Cardiologist (physician) and Advanced Practice Providers or APPs (Physician Assistants and Nurse Practitioners) who all work together to provide you with the care you need, when you need it.  Your next appointment:   1 year(s)  Provider:   Maudine Sos, MD, Slater Duncan, NP, or Neomi Banks, NP    We recommend signing up for the patient portal called "MyChart".  Sign up information is provided on this After Visit Summary.  MyChart is used to connect with patients for Virtual Visits (Telemedicine).  Patients are able to view lab/test results, encounter notes, upcoming appointments, etc.  Non-urgent messages can be sent to your provider as well.   To learn more about what you can do with MyChart, go to [email or web address].   Other Instructions

## 2024-02-15 LAB — BASIC METABOLIC PANEL WITH GFR
BUN: 21 (ref 4–21)
BUN: 21 (ref 4–21)
CO2: 21 (ref 13–22)
Chloride: 102 (ref 99–108)
Creatinine: 1.6 — AB (ref 0.6–1.3)
Glucose: 91
Glucose: 91
Potassium: 4.7 meq/L (ref 3.5–5.1)
Sodium: 140 (ref 137–147)

## 2024-02-15 LAB — HEPATIC FUNCTION PANEL
ALT: 27 U/L (ref 10–40)
AST: 29 (ref 14–40)
Alkaline Phosphatase: 72 (ref 25–125)
Bilirubin, Total: 0.4

## 2024-02-15 LAB — COMPREHENSIVE METABOLIC PANEL WITH GFR
Albumin, Urine POC: 86.9
Albumin/Creatinine Ratio, Urine, POC: 166
Albumin: 4.7 (ref 3.5–5.0)
Calcium: 9.8 (ref 8.7–10.7)
Creatinine, POC: 52.5 mg/dL
Globulin: 2.1
eGFR: 51

## 2024-02-18 ENCOUNTER — Other Ambulatory Visit: Payer: Self-pay | Admitting: Nephrology

## 2024-02-18 DIAGNOSIS — N1831 Chronic kidney disease, stage 3a: Secondary | ICD-10-CM

## 2024-02-23 ENCOUNTER — Other Ambulatory Visit: Payer: Self-pay

## 2024-02-23 ENCOUNTER — Ambulatory Visit
Admission: RE | Admit: 2024-02-23 | Discharge: 2024-02-23 | Disposition: A | Discharge status: Home / Self Care | Source: Ambulatory Visit | Attending: Nephrology | Admitting: Nephrology

## 2024-02-23 ENCOUNTER — Other Ambulatory Visit (HOSPITAL_BASED_OUTPATIENT_CLINIC_OR_DEPARTMENT_OTHER): Payer: Self-pay | Admitting: Cardiovascular Disease

## 2024-02-23 DIAGNOSIS — N1831 Chronic kidney disease, stage 3a: Secondary | ICD-10-CM

## 2024-02-23 MED ORDER — TELMISARTAN 80 MG PO TABS: 80.0000 mg | ORAL_TABLET | Freq: Every day | ORAL | 3 refills | Status: AC

## 2024-02-23 NOTE — Telephone Encounter (Signed)
 MyChart message read by pt.

## 2024-02-23 NOTE — Telephone Encounter (Signed)
 RF request for Telmisartan  80mg  LOV: 01/04/24 Next ov: 02/25/24 Last written: 01/06/24 (15,0) cardiology

## 2024-02-23 NOTE — Telephone Encounter (Signed)
 Patient walked in to office request refill. Patient just left pharmacy.  Med was originally prescribed by Dr. McGowen and cardiology increased med.  No refills. Patient needs by Saturday - going out of town.  Prescription Request  02/23/2024  LOV: Visit date not found  What is the name of the medication or equipment? telmisartan  (MICARDIS ) 80 MG tablet   Have you contacted your pharmacy to request a refill? Yes   Which pharmacy would you like this sent to?  [id number] - [place], [place] - [address] [place] [address] Phone: [phone] Fax: [phone]  [facility] [address], [place] - [address] [place] [address] Phone: [phone] Fax: [phone]    Patient notified that their request is being sent to the clinical staff for review and that they should receive a response within 2 business days.   Please advise at Mobile [phone] (mobile)

## 2024-02-25 ENCOUNTER — Encounter: Payer: Self-pay | Admitting: Family Medicine

## 2024-02-25 ENCOUNTER — Ambulatory Visit (INDEPENDENT_AMBULATORY_CARE_PROVIDER_SITE_OTHER): Admitting: Family Medicine

## 2024-02-25 VITALS — BP 120/76 | HR 56 | Temp 98.0°F | Ht 66.0 in | Wt 155.4 lb

## 2024-02-25 DIAGNOSIS — Z7984 Long term (current) use of oral hypoglycemic drugs: Secondary | ICD-10-CM

## 2024-02-25 DIAGNOSIS — Z125 Encounter for screening for malignant neoplasm of prostate: Secondary | ICD-10-CM | POA: Diagnosis not present

## 2024-02-25 DIAGNOSIS — E1121 Type 2 diabetes mellitus with diabetic nephropathy: Secondary | ICD-10-CM

## 2024-02-25 DIAGNOSIS — D500 Iron deficiency anemia secondary to blood loss (chronic): Secondary | ICD-10-CM | POA: Diagnosis not present

## 2024-02-25 DIAGNOSIS — N1831 Chronic kidney disease, stage 3a: Secondary | ICD-10-CM

## 2024-02-25 DIAGNOSIS — E119 Type 2 diabetes mellitus without complications: Secondary | ICD-10-CM

## 2024-02-25 DIAGNOSIS — E7800 Pure hypercholesterolemia, unspecified: Secondary | ICD-10-CM

## 2024-02-25 DIAGNOSIS — R6882 Decreased libido: Secondary | ICD-10-CM

## 2024-02-25 NOTE — Progress Notes (Signed)
 OFFICE VISIT  02/25/2024  CC:  Chief Complaint  Patient presents with   Annual Exam    Patient is a 58 y.o. male who presents for 7-week follow-up diabetes and chronic renal insufficiency. 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 Recheck microalb/cr and bmet today. Last A1c 6%. We discussed adding jardiance  in light of #1 and his hx of CAD but he is holding off for now. Next A1c around 03/01/24.   #2 Hyperchol. Recent switch from rosuvastatin  to atorvastatin  about 3 wks ago. Plan recheck lipids about 2-3 mo. Cont zetia .   #3 Dry mouth/thirst. Unknown etiology. No hyperglycemia. Check sjogren's ab panel today.  INTERIM HX: Candelario feels very well. The dry mouth has resolved.  He has seen the nephrologist.  Renal ultrasound 2 days ago showed medical renal disease, otherwise normal.  Lab panel there 02/15/2024 showed microalbumin creatinine ratio of 166, a little bit above what ours was on 01/04/2024, which was 125. His serum creatinine was stable at 1.6. He started his Jardiance  1 week ago.  He had a follow-up with cardiology recently.  He states they plan on transitioning him to a PCSK9-I.  He has little bit of decreased libido and ED.  ROS --> no fevers, no CP, no SOB, no wheezing, no cough, no dizziness, no HAs, no rashes, no melena/hematochezia.  No polyuria or polydipsia.  No myalgias or arthralgias.  No focal weakness, paresthesias, or tremors.  No acute vision or hearing abnormalities.  No dysuria or unusual/new urinary urgency or frequency.  No recent changes in lower legs. No n/v/d or abd pain.  No palpitations.    Past Medical History:  Diagnosis Date   Acute epiglottitis 01/2014   BPH with obstruction/lower urinary tract symptoms    [facility] referral done 01/2011 (Dr. Alline); ultimately a prostate biopsy was done for increased PSA velocity--April 2013--and it  was completely normal.   CAD S/P percutaneous  coronary angioplasty 05/05/2017   Progressive Angina: (3 V CAD on Cor Calcium  Score) --> LEFT HEART CATH & CORONARY ANGIOGRAPHY  / CORONARY STENT INTERVENTION Conclusion   LESION #1: p-mLAD to Mid LAD 80 %s --> PCI SYNERGY DES 2.75X16 - Tapered post-dilation: 3.1-2.9 mm.  LESION #2: pRI 85 % - PCI SYNERGY DES 3X12.  Initial plan ASA/Plavix  x 1 yr  --> ASA stopped 06/2017 for GI Bleed-- on Plavix  alone and continued w/this 11/2017   Chronic renal insufficiency, stage 3 (moderate) (HCC)    Diabetes mellitus without complication (HCC)    a1c 6.8% Dec 2023   Elevated liver function tests    mild   Epididymal cyst 09/2019   Right (vs spermatocele.  Hematocele less likely since no hx of trauma).   Erectile dysfunction    Family history of colon cancer    father   History of adenomatous polyp of colon 2007; 2014; 08/2019   Dr. Abran did 2020 scope.  Recall 2025.   History of sciatica 11/2013   responded well to prednisone  burst 11/2013   Hyperlipidemia    On 40 mg Crestor  as of 06/2017   Hypertension    On telmisartan    Iron deficiency anemia 06/2017   Hb drop from 15 down to 11 from 04/2017-06/2017.  +H pylori gastritis+.  Recurrence 01/2019 +correlation with melena and fatigue.  Pt has been back on iron x 2 wks and is making arrangements to see his GI MD as of 02/08/19 (? repeat EGD +  is overdue for repeat colonoscopy). Hemoccults NEG X 3 Sept 2023.   Upper GI bleed 12/28/2022   bleeding duodenal AVM   UTI (urinary tract infection)    Enterococcus faecalis on urine clx Feb, Mar, and April 2024    Past Surgical History:  Procedure Laterality Date   CARDIOVASCULAR STRESS TEST  11/16/2019   Normal EF, no ischemia   COLONOSCOPY W/ POLYPECTOMY  09/02/06; 07/2013; 08/2019   2014 [facility]: adenomatous polyp w/out high grade dysplasia or malignancy.  08/2019 adenoma x 2, diverticulosis, recall 5 yrs.   coronary calcium  score     Very high risk: as of 03/2017 pt is to return to discuss with Dr.  Anner: stress test vs cath as next step.   CORONARY STENT INTERVENTION N/A 05/05/2017   DES x 2. Preserved LV function.  Procedure: CORONARY STENT INTERVENTION;  Surgeon: Anner Alm ORN, MD;  Location: [facility] INVASIVE CV LAB;  Service: Cardiovascular;: CAD S/P PCI: p-mLAD Synergy DES 2.75 x 16 (3.1-2.9 mm), p-mRI Synergy DES 3.0 x 12 (3.3 mm)   dental implants     ESOPHAGOGASTRODUODENOSCOPY  07/08/2017   06/2017 GI RECOMMENDED D/C ASPIRIN :  superfic non-bleeding ulcers ranging 5-25mm in size in antrum.  +H pylori +.  A few non-bleeding erosions in pre-pyloric region.  Normal duodenum.  12/2022 bleeding duodenal AVM   LEFT HEART CATH AND CORONARY ANGIOGRAPHY N/A 05/05/2017   Procedure: LEFT HEART CATH AND CORONARY ANGIOGRAPHY;  Surgeon: Anner Alm ORN, MD;  Location: [facility] INVASIVE CV LAB;  Service: Cardiovascular;  Laterality: N/A; p-m LAD 80%, mRI 90% --> 2 vessel PCI   NM MYOVIEW  LTD  11/16/2019   Fixed basal inferoseptal and mid anteroseptal perfusion defect consistent with subendocardial scar, NO ISCHEMIA.  EF 52%.  Unable to interpret EKG.  LOW Risk.   PROSTATE BIOPSY  01/2012   Normal    Outpatient Medications Prior to Visit  Medication Sig Dispense Refill   atorvastatin  (LIPITOR) 10 MG tablet Take 1 tablet (10 mg total) by mouth daily. 90 tablet 1   clopidogrel  (PLAVIX ) 75 MG tablet TAKE 1 TABLET BY MOUTH EVERY DAY 90 tablet 3   ezetimibe  (ZETIA ) 10 MG tablet Take 1 tablet (10 mg total) by mouth daily. 90 tablet 3   ferrous sulfate 325 (65 FE) MG tablet Take 325 mg by mouth every other day.     JARDIANCE  10 MG TABS tablet TAKE 1 TABLET BY MOUTH DAILY BEFORE BREAKFAST. (Patient not taking: Reported on 02/04/2024) 30 tablet 0   Multiple Vitamin (MULTIVITAMIN WITH MINERALS) TABS tablet Take 1 tablet by mouth daily.     pantoprazole  (PROTONIX ) 40 MG tablet TAKE 1 TABLET BY MOUTH TWICE A DAY (Patient taking differently: Take 40 mg by mouth daily.) 180 tablet 0   sildenafil  (VIAGRA ) 100 MG tablet  TAKE 1 TABLET BY MOUTH ONCE DAILY AS NEEDED FOR ERECTILE DYSFUNCTION 10 tablet 5   tamsulosin  (FLOMAX ) 0.4 MG CAPS capsule Take 1 capsule (0.4 mg total) by mouth daily. 90 capsule 3   telmisartan  (MICARDIS ) 80 MG tablet Take 1 tablet (80 mg total) by mouth daily. 90 tablet 3   No facility-administered medications prior to visit.    Allergies  Allergen Reactions   Ace Inhibitors Cough   Shrimp [Shellfish Allergy] Other (See Comments)    Swelling around eyes/lips---can eat IF cooked WELL   Thiazide-Type Diuretics     FATIGUE; DIZZINESS.      Review of Systems As per HPI  PE:    02/04/2024  8:24 AM 01/04/2024    9:03 AM 12/21/2023   11:36 AM  Vitals with BMI  Height 5' 4    Weight 157 lbs 2 oz 155 lbs 3 oz 159 lbs  BMI 26.95  27.28  Systolic 118 115 873  Diastolic 70 76 68  Pulse 60 82 63     Physical Exam  : Alert and well-appearing Affect is pleasant, thought and speech are lucid. No further exam today.  LABS:  Last CBC Lab Results  Component Value Date   WBC 3.4 (L) 12/09/2023   HGB 16.4 12/09/2023   HCT 47.8 12/09/2023   MCV 95.4 12/09/2023   MCH 26.6 (L) 01/08/2023   RDW 12.9 12/09/2023   PLT 149.0 (L) 12/09/2023   Lab Results  Component Value Date   IRON 142 11/30/2023   TIBC 358 11/30/2023   FERRITIN 73 11/30/2023   Last metabolic panel Lab Results  Component Value Date   GLUCOSE 110 (H) 01/04/2024   NA 140 02/15/2024   K 4.7 02/15/2024   CL 102 02/15/2024   CO2 21 02/15/2024   BUN 21 02/15/2024   BUN 21 02/15/2024   CREATININE 1.6 (A) 02/15/2024   EGFR 51 02/15/2024   CALCIUM  9.8 02/15/2024   PROT 7.4 11/30/2023   ALBUMIN 4.7 02/15/2024   LABGLOB 2.6 12/30/2021   AGRATIO 1.7 12/30/2021   BILITOT 0.5 11/30/2023   ALKPHOS 72 02/15/2024   AST 29 02/15/2024   ALT 27 02/15/2024   ANIONGAP 7 05/06/2017   Last lipids Lab Results  Component Value Date   CHOL 107 11/30/2023   HDL 30.70 (L) 11/30/2023   LDLCALC 59 11/30/2023    LDLDIRECT 72.0 12/08/2016   TRIG 88.0 11/30/2023   CHOLHDL 3 11/30/2023   Last hemoglobin A1c Lab Results  Component Value Date   HGBA1C 6.0 11/30/2023   Lab Results  Component Value Date   PSA 2.47 11/03/2022   PSA 0.73 10/06/2021   PSA 0.56 04/03/2020   IMPRESSION AND PLAN:  #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 Good control.  He started Jardiance  1 week ago. Plan is to increase Jardiance  to 25 mg when he has taken the 10 mg dose for 1 month.  We will check renal function prior to this change. He is now established with nephrology. Check hemoglobin A1c and metabolic panel today.  2.  Hypertension, well-controlled on telmisartan  80 mg a day. Electrolytes and creatinine today.  3.  Hypercholesterolemia. He is on atorvastatin  10 mg a day and Zetia  10 mg a day. Last LDL was 59 approximately 3 months ago. Lipid panel and lipoprotein a level check today. Cardiology is planning on transitioning him to PCSK9 inhibitor soon.  #4 decreased libido and history of ED. Check testosterone  level today.  An After Visit Summary was printed and given to the patient.  FOLLOW UP: 3 months  Signed:  Gerlene Hockey, MD           02/25/2024

## 2024-02-28 ENCOUNTER — Ambulatory Visit: Payer: Self-pay | Admitting: Family Medicine

## 2024-02-28 DIAGNOSIS — E1121 Type 2 diabetes mellitus with diabetic nephropathy: Secondary | ICD-10-CM

## 2024-02-28 DIAGNOSIS — E7800 Pure hypercholesterolemia, unspecified: Secondary | ICD-10-CM

## 2024-02-29 LAB — LIPID PANEL
Cholesterol: 109 mg/dL (ref <200)
HDL: 33 mg/dL — ABNORMAL LOW (ref >=40)
LDL Cholesterol (Calc): 58 mg/dL
Non-HDL Cholesterol (Calc): 76 mg/dL (ref <130)
Total CHOL/HDL Ratio: 3.3 (calc) (ref <5.0)
Triglycerides: 99 mg/dL (ref <150)

## 2024-02-29 LAB — HEMOGLOBIN A1C
Hgb A1c MFr Bld: 5.7 % — ABNORMAL HIGH (ref <5.7)
Mean Plasma Glucose: 117 mg/dL
eAG (mmol/L): 6.5 mmol/L

## 2024-02-29 LAB — PSA: PSA: 0.5 ng/mL (ref <=4.00)

## 2024-02-29 LAB — TESTOSTERONE: Testosterone: 444 ng/dL (ref 250–827)

## 2024-02-29 LAB — LIPOPROTEIN A (LPA): Lipoprotein (a): <10 nmol/L (ref <75)

## 2024-03-09 ENCOUNTER — Other Ambulatory Visit (HOSPITAL_COMMUNITY): Payer: Self-pay

## 2024-03-09 ENCOUNTER — Telehealth: Payer: Self-pay | Admitting: Family

## 2024-03-09 ENCOUNTER — Telehealth: Payer: Self-pay | Admitting: Pharmacy Technician

## 2024-03-09 DIAGNOSIS — E785 Hyperlipidemia, unspecified: Secondary | ICD-10-CM

## 2024-03-09 MED ORDER — EMPAGLIFLOZIN 25 MG PO TABS: 25.0000 mg | ORAL_TABLET | Freq: Every day | ORAL | 3 refills | Status: DC

## 2024-03-09 NOTE — Telephone Encounter (Signed)
 Called and left voice message to call back

## 2024-03-09 NOTE — Telephone Encounter (Signed)
 Pt is requesting a callback regarding him wanting to see about the injectables that was discussed at his last appt and recent labs. Please advise

## 2024-03-09 NOTE — Telephone Encounter (Signed)
 Copied from CRM [id number]. Topic: Clinical - Lab/Test Results >> Mar 09, 2024  9:04 AM Thomas Spencer wrote: Reason for CRM: Pt would like the PCP to know he had several labs done about 2 weeks ago and he would like to get the results about the labs please. Patient would prefer this information to be communicated to him through MyChart instead of a return phone call please.

## 2024-03-09 NOTE — Telephone Encounter (Signed)
 [facility] message sent to pt.

## 2024-03-09 NOTE — Telephone Encounter (Signed)
 Because his primary care collected labs we did not have results automatically routed to us . labs reviewed. Normal Lp(a) meaning no evidence of familial hyperlipidemia. LDL (bad cholesterol) of 99 which is ont at goal <70. Recommend Rx Repatha 140mg  SQ q14 days and send to prior auth team. (PA team - CAD, intolerant to Rosuvastatin )  Reche GORMAN Finder, NP

## 2024-03-09 NOTE — Addendum Note (Signed)
 Addended by: CANDISE ALEENE DEL on: 03/09/2024 09:57 PM   Modules accepted: Orders

## 2024-03-09 NOTE — Telephone Encounter (Signed)
 Pharmacy Patient Advocate Encounter  Received notification from [employer] that Prior Authorization for repatha 140mg  has been APPROVED from 03/09/24 to 03/09/25. Ran test claim, Copay is $20.00. This test claim was processed through [facility]- copay amounts may vary at other pharmacies due to pharmacy/plan contracts, or as the patient moves through the different stages of their insurance plan.   You can now send a prescription for repatha to his pharmacy, thank you!

## 2024-03-09 NOTE — Telephone Encounter (Signed)
 Pharmacy Patient Advocate Encounter   Received notification from Pt Calls Messages-Chantel that prior authorization for Repatha SureClick 140MG /ML auto-injectors is required/requested.   Insurance verification completed.   The patient is insured through [employer] .   Per test claim: PA required; PA submitted to above mentioned insurance via CoverMyMeds Key/confirmation #/EOC [id number] Status is pending

## 2024-03-09 NOTE — Telephone Encounter (Signed)
 Pt returned call and wondered if APP reviewed his labs to pursue PCSK9i. Informed pt that labs have not been reviewed but will relay info to APP and message pt back with recommendations and probable plan.   Pt aware and in agreement. Would recommend MC over telephone call.

## 2024-03-10 MED ORDER — REPATHA SURECLICK 140 MG/ML ~~LOC~~ SOAJ: 140.0000 mg | SUBCUTANEOUS | 6 refills | Status: DC

## 2024-03-20 NOTE — Telephone Encounter (Signed)
 No further action needed.

## 2024-03-21 ENCOUNTER — Other Ambulatory Visit: Payer: Self-pay | Admitting: Family Medicine

## 2024-03-22 ENCOUNTER — Other Ambulatory Visit: Payer: Self-pay | Admitting: Family Medicine

## 2024-03-22 NOTE — Telephone Encounter (Signed)
 Copied from CRM [id number]. Topic: Clinical - Medication Refill >> Mar 22, 2024  5:01 PM Leah C wrote: Medication: tamsulosin  (FLOMAX ) 0.4 MG CAPS capsule; pantoprazole  (PROTONIX ) 40 MG tablet  Has the patient contacted their pharmacy? Yes, told to call us .  (Agent: If no, request that the patient contact the pharmacy for the refill. If patient does not wish to contact the pharmacy document the reason why and proceed with request.) (Agent: If yes, when and what did the pharmacy advise?)  This is the patient's preferred pharmacy:  [id number] [place], [place] - [address] [place] [address] Phone: [phone] Fax: [phone]  Is this the correct pharmacy for this prescription? Yes If no, delete pharmacy and type the correct one.   Has the prescription been filled recently? Yes  Is the patient out of the medication? Yes  Has the patient been seen for an appointment in the last year OR does the patient have an upcoming appointment? Yes  Can we respond through MyChart? Yes  Agent: Please be advised that Rx refills may take up to 3 business days. We ask that you follow-up with your pharmacy.

## 2024-03-23 ENCOUNTER — Other Ambulatory Visit: Payer: Self-pay | Admitting: Family Medicine

## 2024-03-23 MED ORDER — PANTOPRAZOLE SODIUM 40 MG PO TBEC: 40.0000 mg | DELAYED_RELEASE_TABLET | Freq: Two times a day (BID) | ORAL | 0 refills | Status: DC

## 2024-03-23 MED ORDER — TAMSULOSIN HCL 0.4 MG PO CAPS: 0.4000 mg | ORAL_CAPSULE | Freq: Every day | ORAL | 3 refills | Status: AC

## 2024-04-04 ENCOUNTER — Other Ambulatory Visit: Payer: Self-pay | Admitting: Family Medicine

## 2024-04-12 NOTE — Progress Notes (Signed)
 Diabetes Self-Management Education  Visit Type: First/Initial  Appt. Start Time: 1400 Appt. End Time: 1503  04/19/2024  [name], identified by name and date of birth, is a 58 y.o. male with a diagnosis of Diabetes: Type 2 (n/a).   ASSESSMENT  Patient is here today with his wife. Patient would like to learn how to eat to fine tune his diet. Pt desires a lowered A1C%. Patient lives with his wife and she does the majority of the shopping and cooking.  Pt reports he is using the Freestyle [facility] and has experienced alarms for hypoglycemia recently. Pt reports engaging with CGM regularly and states his recent blood sugar range 68 -156 mg/dL. Pt reports symptoms of hungry with blood sugars less than 70. Pt reports typical intake of 3 meals plus snacks 3-4 most days. 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  Pt reports the past 6 months he has made and maintained the following changes including omitting orange juice and decreasing coffee intake. Pt reports lactose intolerance and can tolerate yogurt and cheese. All Pt and his wife questions were answered duirn this encounter.   History includes:   Past Medical History:  Diagnosis Date   Acute epiglottitis 01/2014   BPH with obstruction/lower urinary tract symptoms    [facility] referral done 01/2011 (Dr. Alline); ultimately a prostate biopsy was done for increased PSA velocity--April 2013--and it  was completely normal.   CAD S/P percutaneous coronary angioplasty 05/05/2017   Progressive Angina: (3 V CAD on Cor Calcium  Score) --> LEFT HEART CATH & CORONARY ANGIOGRAPHY  / CORONARY STENT INTERVENTION Conclusion   LESION #1: p-mLAD to Mid LAD 80 %s --> PCI SYNERGY DES 2.75X16 - Tapered post-dilation: 3.1-2.9 mm.  LESION #2: pRI 85 % - PCI SYNERGY DES 3X12.  Initial plan ASA/Plavix  x 1 yr  --> ASA stopped 06/2017 for GI Bleed-- on Plavix  alone and continued w/this 11/2017   Chronic renal insufficiency,  stage 3 (moderate) (HCC)    Elevated liver function tests    mild   Epididymal cyst 09/2019   Right (vs spermatocele.  Hematocele less likely since no hx of trauma).   Erectile dysfunction    Family history of colon cancer    father   History of adenomatous polyp of colon 2007; 2014; 08/2019   Dr. Abran did 2020 scope.  Recall 2025.   History of sciatica 11/2013   responded well to prednisone  burst 11/2013   Hyperlipidemia    ? proteinuria inc on rosuva so pt preferred trial of atorva.  As of 02/2024, cardiologist is transitioning him to PCSK9 I + atorva but no zetia    Hypertension    On telmisartan    Iron deficiency anemia 06/2017   Hb drop from 15 down to 11 from 04/2017-06/2017.  +H pylori gastritis+.  Recurrence 01/2019 +correlation with melena and fatigue.  Pt has been back on iron x 2 wks and is making arrangements to see his GI MD as of 02/08/19 (? repeat EGD + is overdue for repeat colonoscopy). Hemoccults NEG X 3 Sept 2023.   Type 2 diabetes with nephropathy (HCC)    + albuminuria   Upper GI bleed 12/28/2022   bleeding duodenal AVM   UTI (urinary tract infection)    Enterococcus faecalis on urine clx Feb, Mar, and April 2024    Medications include:   Current Outpatient Medications:    clopidogrel  (PLAVIX ) 75 MG tablet, TAKE 1 TABLET BY MOUTH EVERY  DAY, Disp: 30 tablet, Rfl: 2   Evolocumab  (REPATHA  SURECLICK) 140 MG/ML SOAJ, Inject 140 mg into the skin every 14 (fourteen) days., Disp: 2 mL, Rfl: 6   ferrous sulfate 325 (65 FE) MG tablet, Take 325 mg by mouth every other day., Disp: , Rfl:    Multiple Vitamin (MULTIVITAMIN WITH MINERALS) TABS tablet, Take 1 tablet by mouth daily., Disp: , Rfl:    pantoprazole  (PROTONIX ) 40 MG tablet, TAKE 1 TABLET BY MOUTH TWICE A DAY, Disp: 180 tablet, Rfl: 0   tamsulosin  (FLOMAX ) 0.4 MG CAPS capsule, Take 1 capsule (0.4 mg total) by mouth daily., Disp: 90 capsule, Rfl: 3   telmisartan  (MICARDIS ) 80 MG tablet, Take 1 tablet (80 mg total) by mouth  daily., Disp: 90 tablet, Rfl: 3   atorvastatin  (LIPITOR) 10 MG tablet, Take 1 tablet (10 mg total) by mouth daily. (Patient not taking: Reported on 04/19/2024), Disp: 90 tablet, Rfl: 1   empagliflozin  (JARDIANCE ) 25 MG TABS tablet, Take 1 tablet (25 mg total) by mouth daily before breakfast. (Patient not taking: Reported on 04/19/2024), Disp: 90 tablet, Rfl: 3   ezetimibe  (ZETIA ) 10 MG tablet, Take 1 tablet (10 mg total) by mouth daily. (Patient not taking: Reported on 04/19/2024), Disp: 90 tablet, Rfl: 3   sildenafil  (VIAGRA ) 100 MG tablet, TAKE 1 TABLET BY MOUTH ONCE DAILY AS NEEDED FOR ERECTILE DYSFUNCTION, Disp: 10 tablet, Rfl: 5  Labs noted:   Lab Results  Component Value Date   CHOL 109 02/25/2024   HDL 33 (L) 02/25/2024   LDLCALC 58 02/25/2024   LDLDIRECT 72.0 12/08/2016   TRIG 99 02/25/2024   CHOLHDL 3.3 02/25/2024   Lab Results  Component Value Date   HGBA1C 5.7 (H) 02/25/2024   Lab Results  Component Value Date   NA 140 02/15/2024   CL 102 02/15/2024   K 4.7 02/15/2024   CO2 21 02/15/2024   BUN 21 02/15/2024   BUN 21 02/15/2024   CREATININE 1.6 (A) 02/15/2024   EGFR 51 02/15/2024   CALCIUM  9.8 02/15/2024   ALBUMIN 4.7 02/15/2024   GLUCOSE 110 (H) 01/04/2024   Wt Readings from Last 3 Encounters:  04/19/24 156 lb 3.2 oz (70.9 kg)  02/25/24 155 lb 6.4 oz (70.5 kg)  02/04/24 157 lb 1.6 oz (71.3 kg)   Weight 156 lb 3.2 oz (70.9 kg). Body mass index is 25.21 kg/m.   Diabetes Self-Management Education - 04/19/24 1406       Visit Information   Visit Type First/Initial      Initial Visit   Diabetes Type Type 2   n/a   Are you currently following a meal plan? No    Are you taking your medications as prescribed? No      Health Coping   How would you rate your overall health? Good      Psychosocial Assessment   Patient Belief/Attitude about Diabetes Defeat/Burnout    What is the hardest part about your diabetes right now, causing you the most concern, or is the most  worrisome to you about your diabetes?   Making healty food and beverage choices    Self-care barriers None    Self-management support Doctor's office    Other persons present Patient    Patient Concerns Nutrition/Meal planning    Special Needs None    Preferred Learning Style No preference indicated    Learning Readiness Contemplating    How often do you need to have someone help you when you read instructions,  pamphlets, or other written materials from your doctor or pharmacy? 1 - Never    What is the last grade level you completed in school? doctorate      Pre-Education Assessment   Patient understands the diabetes disease and treatment process. Needs Instruction    Patient understands incorporating nutritional management into lifestyle. Needs Instruction    Patient undertands incorporating physical activity into lifestyle. Needs Instruction    Patient understands using medications safely. Needs Instruction    Patient understands monitoring blood glucose, interpreting and using results Needs Instruction    Patient understands prevention, detection, and treatment of acute complications. Needs Instruction    Patient understands prevention, detection, and treatment of chronic complications. Needs Instruction    Patient understands how to develop strategies to address psychosocial issues. Needs Instruction    Patient understands how to develop strategies to promote health/change behavior. Needs Instruction      Complications   Last HgB A1C per patient/outside source 5.7 %    How often do you check your blood sugar? > 4 times/day    Fasting Blood glucose range (mg/dL) 29-870    Number of hypoglycemic episodes per month 0    Number of hyperglycemic episodes ( >200mg /dL): Never    Can you tell when your blood sugar is high? No    Have you had a dilated eye exam in the past 12 months? Yes    Have you had a dental exam in the past 12 months? Yes    Are you checking your feet? Yes    How many  days per week are you checking your feet? 7      Dietary Intake   Breakfast okra, eggs scrambles in olive oil, water, sparkling water, coffee with oat milk and  honey    Snack (morning) pecans    Lunch grilled salmon, greek salad, water    Snack (afternoon) cashews, fruit    Dinner baked chicken wings, cucumber salad, okra, water    Snack (evening) melon or cherries    Beverage(s) water, sparkling water, tea with honey and oat milk, coffee with oat milk and honey      Activity / Exercise   Activity / Exercise Type Light (walking / raking leaves)    How many days per week do you exercise? 7    How many minutes per day do you exercise? 15    Total minutes per week of exercise 105      Patient Education   Previous Diabetes Education No    Disease Pathophysiology Definition of diabetes, type 1 and 2, and the diagnosis of diabetes    Healthy Eating Plate Method;Carbohydrate counting;Role of diet in the treatment of diabetes and the relationship between the three main macronutrients and blood glucose level;Food label reading, portion sizes and measuring food.;Reviewed blood glucose goals for pre and post meals and how to evaluate the patients' food intake on their blood glucose level.    Being Active Role of exercise on diabetes management, blood pressure control and cardiac health.    Medications Reviewed patients medication for diabetes, action, purpose, timing of dose and side effects.    Monitoring Identified appropriate SMBG and/or A1C goals.;Taught/evaluated CGM (comment);Taught/evaluated SMBG meter.    Acute complications Taught prevention, symptoms, and  treatment of hypoglycemia - the 15 rule.    Chronic complications Relationship between chronic complications and blood glucose control    Lifestyle and Health Coping Helped patient develop diabetes management plan for (enter comment)   Pt  desires reversal     Individualized Goals (developed by patient)   Nutrition Follow meal plan  discussed    Physical Activity Exercise 5-7 days per week;30 minutes per day    Medications take my medication as prescribed    Monitoring  Test my blood glucose as discussed    Problem Solving Eating Pattern    Reducing Risk treat hypoglycemia with 15 grams of carbs if blood glucose less than 70mg /dL    Health Coping Ask for help with psychological, social, or emotional issues      Post-Education Assessment   Patient understands the diabetes disease and treatment process. Comprehends key points    Patient understands incorporating nutritional management into lifestyle. Comprehends key points    Patient undertands incorporating physical activity into lifestyle. Demonstrates understanding / competency    Patient understands using medications safely. Comphrehends key points    Patient understands monitoring blood glucose, interpreting and using results Comprehends key points    Patient understands prevention, detection, and treatment of acute complications. Comprehends key points    Patient understands prevention, detection, and treatment of chronic complications. Comprehends key points    Patient understands how to develop strategies to address psychosocial issues. Comprehends key points    Patient understands how to develop strategies to promote health/change behavior. Comprehends key points      Outcomes   Expected Outcomes Demonstrated interest in learning. Expect positive outcomes    Future DMSE 6 months    Program Status Not Completed          Individualized Plan for Diabetes Self-Management Training:   Learning Objective:  Patient will have a greater understanding of diabetes self-management. Patient education plan is to attend individual and/or group sessions per assessed needs and concerns.   Plan:   Patient Instructions  Aim for balanced snacks and meals  Maintain your physical activity-Great Job!   Expected Outcomes:  Demonstrated interest in learning. Expect positive  outcomes  Education material provided: ADA - How to Thrive: A Guide for Your Journey with Diabetes, A1C conversion sheet, My Plate, Snack sheet, and Diabetes Resources  If problems or questions, patient to contact team via:  Phone  Future DSME appointment: 6 months

## 2024-04-15 ENCOUNTER — Encounter: Payer: Self-pay | Admitting: Family Medicine

## 2024-04-19 ENCOUNTER — Encounter: Attending: Family | Admitting: Dietician

## 2024-04-19 VITALS — Wt 156.2 lb

## 2024-04-19 DIAGNOSIS — E119 Type 2 diabetes mellitus without complications: Secondary | ICD-10-CM | POA: Insufficient documentation

## 2024-04-19 NOTE — Patient Instructions (Addendum)
 Aim for balanced snacks and meals  Maintain your physical activity-Great Job!

## 2024-04-20 ENCOUNTER — Other Ambulatory Visit: Payer: Self-pay | Admitting: Family Medicine

## 2024-04-21 ENCOUNTER — Other Ambulatory Visit: Payer: Self-pay | Admitting: Family Medicine

## 2024-04-21 MED ORDER — EMPAGLIFLOZIN 25 MG PO TABS: 25.0000 mg | ORAL_TABLET | Freq: Every day | ORAL | 0 refills | Status: DC

## 2024-04-21 NOTE — Telephone Encounter (Signed)
 Copied from CRM [id number]. Topic: Clinical - Medication Refill >> Apr 21, 2024  2:59 PM [place] J wrote: Medication: empagliflozin  (JARDIANCE ) 25 MG TABS tablet  Has the patient contacted their pharmacy? Yes (Agent: If no, request that the patient contact the pharmacy for the refill. If patient does not wish to contact the pharmacy document the reason why and proceed with request.) (Agent: If yes, when and what did the pharmacy advise?) Pharmacy tried to request refill on patient's behalf but it was denied due to expired request.  This is the patient's preferred pharmacy:  [id number] [place], [place] - [address] [place] [address] Phone: [phone] Fax: [phone]  Is this the correct pharmacy for this prescription? Yes If no, delete pharmacy and type the correct one.   Has the prescription been filled recently? No  Is the patient out of the medication? No  Has the patient been seen for an appointment in the last year OR does the patient have an upcoming appointment? Yes  Can we respond through MyChart? Yes  Agent: Please be advised that Rx refills may take up to 3 business days. We ask that you follow-up with your pharmacy.

## 2024-05-05 ENCOUNTER — Telehealth: Payer: Self-pay

## 2024-05-05 NOTE — Telephone Encounter (Signed)
 Source  Thomas Spencer (Patient)   Subject  Thomas Spencer (Patient)   Topic  Clinical - Prescription Issue    Communication  Reason for CRM: Patient called in stating that his dosage for  Jardiance  is suppose to be 10 mg instead of 25mg .He stated that he was on a lower dosage previously,and decided wit provider because of his kidney disease they would stay on the lower dosage        .    Patient also needs a refill on his  pantoprazole  (PROTONIX ) 40 MG tablet medication. And he would like to know if he should only be taking this medication once a day?            [id number] [place], [place] - [address]    Phone: [phone]    Fax: [phone]    Based on last OV 6/13, patient was supposed to increase Jardiance  from 10 to 25mg  after 1 month. Please confirm if pt should be taking Protonix  once or twice a day

## 2024-05-07 ENCOUNTER — Other Ambulatory Visit: Payer: Self-pay | Admitting: Family Medicine

## 2024-05-08 ENCOUNTER — Other Ambulatory Visit: Payer: Self-pay | Admitting: Family Medicine

## 2024-05-08 MED ORDER — PANTOPRAZOLE SODIUM 40 MG PO TBEC: 40.0000 mg | DELAYED_RELEASE_TABLET | Freq: Two times a day (BID) | ORAL | 3 refills | Status: DC

## 2024-05-08 MED ORDER — EMPAGLIFLOZIN 10 MG PO TABS: 10.0000 mg | ORAL_TABLET | Freq: Every day | ORAL | 3 refills | Status: AC

## 2024-05-08 NOTE — Telephone Encounter (Signed)
 Take pantoprazole  once a day. New prescriptions sent.

## 2024-05-08 NOTE — Telephone Encounter (Signed)
 My chart message read.

## 2024-05-29 ENCOUNTER — Other Ambulatory Visit (INDEPENDENT_AMBULATORY_CARE_PROVIDER_SITE_OTHER)

## 2024-05-29 DIAGNOSIS — N1831 Chronic kidney disease, stage 3a: Secondary | ICD-10-CM

## 2024-05-29 DIAGNOSIS — E1121 Type 2 diabetes mellitus with diabetic nephropathy: Secondary | ICD-10-CM | POA: Diagnosis not present

## 2024-05-29 LAB — BASIC METABOLIC PANEL WITH GFR
BUN: 21 mg/dL (ref 6–23)
CO2: 25 meq/L (ref 19–32)
Calcium: 9.3 mg/dL (ref 8.4–10.5)
Chloride: 104 meq/L (ref 96–112)
Creatinine, Ser: 1.37 mg/dL (ref 0.40–1.50)
GFR: 56.11 mL/min — ABNORMAL LOW (ref >60.00)
Glucose, Bld: 84 mg/dL (ref 70–99)
Potassium: 4.1 meq/L (ref 3.5–5.1)
Sodium: 138 meq/L (ref 135–145)

## 2024-06-05 NOTE — Telephone Encounter (Signed)
 Hi [name], I'm sorry I did not order the hemoglobin A1c and cholesterol panel. I anticipated seeing you this month for follow-up. Please return for another lab visit for these tests. I am sorry about the inconvenience. Thomas Spencer

## 2024-06-05 NOTE — Addendum Note (Signed)
 Addended by: CANDISE ALEENE DEL on: 06/05/2024 01:37 PM   Modules accepted: Orders

## 2024-06-05 NOTE — Telephone Encounter (Signed)
 MyChart message read.

## 2024-06-05 NOTE — Telephone Encounter (Signed)
 Thomas Spencer, is there any way a hemoglobin A1c, hepatic panel, and lipid panel can be added?

## 2024-06-08 ENCOUNTER — Other Ambulatory Visit (INDEPENDENT_AMBULATORY_CARE_PROVIDER_SITE_OTHER)

## 2024-06-08 DIAGNOSIS — E7800 Pure hypercholesterolemia, unspecified: Secondary | ICD-10-CM

## 2024-06-08 DIAGNOSIS — E1121 Type 2 diabetes mellitus with diabetic nephropathy: Secondary | ICD-10-CM

## 2024-06-08 LAB — LIPID PANEL
Cholesterol: 154 mg/dL (ref 0–200)
HDL: 38.8 mg/dL — ABNORMAL LOW (ref >39.00)
LDL Cholesterol: 85 mg/dL (ref 0–99)
NonHDL: 114.81
Total CHOL/HDL Ratio: 4
Triglycerides: 151 mg/dL — ABNORMAL HIGH (ref 0.0–149.0)
VLDL: 30.2 mg/dL (ref 0.0–40.0)

## 2024-06-08 LAB — HEMOGLOBIN A1C: Hgb A1c MFr Bld: 6.2 % (ref 4.6–6.5)

## 2024-06-14 ENCOUNTER — Telehealth: Payer: Self-pay | Admitting: Cardiovascular Disease

## 2024-06-14 NOTE — Telephone Encounter (Signed)
  Patient would like Dr Raford or Reche to look over his lipid test results from 06/08/24 that was ordered by his PCP and see if she has any recommendations.

## 2024-06-14 NOTE — Telephone Encounter (Signed)
 Returned a call back to the pt.   Pt  is reaching out to Dr. Raford and Reche Finder, NP to make them aware of recent lipids done by his PCP on 9/25. Pt is requesting for either Dr. Raford or Caitlin to take a look his his lipid results and further advise on any changes he needs to make with his cholesterol regimen.   Pt also wanted to make Dr. Raford aware that he saw Jennifer Mullin Diabetes Specialist the other day, and she will be sending her progress note over for Dr. Skeeter review.   Pt is aware that I will route this message to both Dr. Raford and Reche Finder, NP for further review and advisement.  Pt aware they're both out of the office at this time, and we will reach back out to him with their recommendations, once provided.   Pt verbalized understanding and agrees with this plan.

## 2024-06-15 NOTE — Telephone Encounter (Signed)
 LDL at goal <70 which is great! Continue current cholesterol medications.   Rufina Kimery S Haidy Kackley, NP

## 2024-06-15 NOTE — Telephone Encounter (Signed)
 The patient has been notified of the result and verbalized understanding.  All questions (if any) were answered. Gladis Porter HERO, LPN 89/03/7973 8:73 PM

## 2024-06-20 LAB — MICROALBUMIN / CREATININE URINE RATIO: Microalb Creat Ratio: 102

## 2024-06-20 LAB — PROTEIN / CREATININE RATIO, URINE
Albumin, U: 37.3
Creatinine, Urine: 36.5

## 2024-07-11 ENCOUNTER — Other Ambulatory Visit: Payer: Self-pay | Admitting: Family Medicine

## 2024-08-02 ENCOUNTER — Encounter (HOSPITAL_BASED_OUTPATIENT_CLINIC_OR_DEPARTMENT_OTHER): Payer: Self-pay

## 2024-08-03 MED ORDER — METOPROLOL TARTRATE 25 MG PO TABS: 12.5000 mg | ORAL_TABLET | Freq: Two times a day (BID) | ORAL | 0 refills | Status: DC | PRN

## 2024-08-03 NOTE — Telephone Encounter (Signed)
 Spoke with the pt.   He will come into the office to see Reche Finder, NP on 08/15/24 at 1:55 pm.  Pt aware to arrive 15 mins prior to this visit.   Pt aware we will send in metoprolol tartrate 12.5 mg po bid PRN for palpitations.  He is aware he can take this up to twice daily PRN for persistent palpitations.   Pt education provided on how to prevent palpitations.   Confirmed the pharmacy of choice with the pt.  Pt verbalized understanding and agrees with this plan.

## 2024-08-03 NOTE — Telephone Encounter (Signed)
 Recommend call since he has not read MyChart to offer appointment.   If he really wants to be seen prior to leaving, Dr. Lonni has some slots.  If he would like to have Metoprolol  Tartrate 12.5mg  PRN for palpitations on hand okay to send in 30 tablets. He may take up to twice per day for persistent palpitations.   To prevent palpitations: Make sure you are adequately hydrated.  Avoid and/or limit caffeine containing beverages like soda or tea. Exercise regularly.  Manage stress well. Some over the counter medications can cause palpitations such as Benadryl, AdvilPM, TylenolPM. Regular Advil or Tylenol  do not cause palpitations.    Chevon Laufer S Kandace Elrod, NP

## 2024-08-13 ENCOUNTER — Other Ambulatory Visit: Payer: Self-pay | Admitting: Family Medicine

## 2024-08-14 NOTE — Progress Notes (Unsigned)
 Cardiology Office Note:    Date:  08/14/2024  ID:  Thomas Spencer, DOB 10-Feb-1966, MRN [id number] PCP:  Candise Aleene DEL, MD   [facility] Providers Cardiologist:  Annabella Scarce, MD { Click to update primary MD,subspecialty MD or APP then REFRESH:1}    Chief Complaint:   Thomas Spencer presents to the clinic for palpitations.  Narrative History:     Thomas Spencer established cardiology care after [facility] 04/2017 showed severe CAD in mid LAD and proximal ramus intermedius, for which both were treated with a DES. He had a stress test 11/2019 that was low risk. He was admitted 12/2022 to [facility] for GI hemorrhage and was discharged on a PPI BID.   He was last seen in the clinic on 02/06/24 for routine follow-up. He had switched from rosuvastatin  to atorvastatin . Lipid panel was obtained, showing LDL 58 and HDL 33. He reported orthostatic symptoms as well as hyperglycemia, for which he was debating starting Jardiance . He was encouraged to continue following a good diet and exercising regularly. He was scheduled for routine follow-up in one year.     History of Present Illness:    Thomas Spencer is a 58 y.o. male with PMH of CAD s/p PCI, hypertension, hyperlipidemia, and type 2 DM who presents to the clinic regarding new onset palpitations.  ? When, how often, worse/better, diet, fluids, caffeine, new meds  Past Medical History: Past Medical History:  Diagnosis Date   Acute epiglottitis 01/2014   BPH with obstruction/lower urinary tract symptoms    [facility] referral done 01/2011 (Dr. Alline); ultimately a prostate biopsy was done for increased PSA velocity--April 2013--and it  was completely normal.   CAD S/P percutaneous coronary angioplasty 05/05/2017   Progressive Angina: (3 V CAD on Cor Calcium  Score) --> LEFT HEART CATH & CORONARY ANGIOGRAPHY  / CORONARY STENT INTERVENTION Conclusion   LESION #1: p-mLAD to Mid LAD 80 %s --> PCI SYNERGY DES 2.75X16 - Tapered post-dilation:  3.1-2.9 mm.  LESION #2: pRI 85 % - PCI SYNERGY DES 3X12.  Initial plan ASA/Plavix  x 1 yr  --> ASA stopped 06/2017 for GI Bleed-- on Plavix  alone and continued w/this 11/2017   Chronic renal insufficiency, stage 3 (moderate)    Elevated liver function tests    mild   Epididymal cyst 09/2019   Right (vs spermatocele.  Hematocele less likely since no hx of trauma).   Erectile dysfunction    Family history of colon cancer    father   History of adenomatous polyp of colon 2007; 2014; 08/2019   Dr. Abran did 2020 scope.  Recall 2025.   History of sciatica 11/2013   responded well to prednisone  burst 11/2013   Hyperlipidemia    ? proteinuria inc on rosuva so pt preferred trial of atorva.  As of 02/2024, cardiologist is transitioning him to PCSK9 I + atorva but no zetia    Hypertension    On telmisartan    Iron deficiency anemia 06/2017   Hb drop from 15 down to 11 from 04/2017-06/2017.  +H pylori gastritis+.  Recurrence 01/2019 +correlation with melena and fatigue.  Pt has been back on iron x 2 wks and is making arrangements to see his GI MD as of 02/08/19 (? repeat EGD + is overdue for repeat colonoscopy). Hemoccults NEG X 3 Sept 2023.   Type 2 diabetes with nephropathy (HCC)    + albuminuria   Upper GI bleed 12/28/2022   bleeding duodenal AVM   UTI (urinary tract infection)  Enterococcus faecalis on urine clx Feb, Mar, and April 2024    Past Surgical History: Past Surgical History:  Procedure Laterality Date   CARDIOVASCULAR STRESS TEST  11/16/2019   Normal EF, no ischemia   COLONOSCOPY W/ POLYPECTOMY  09/02/06; 07/2013; 08/2019   2014 [facility]: adenomatous polyp w/out high grade dysplasia or malignancy.  08/2019 adenoma x 2, diverticulosis, recall 5 yrs.   coronary calcium  score     Very high risk: as of 03/2017 pt is to return to discuss with Dr. Anner: stress test vs cath as next step.   CORONARY STENT INTERVENTION N/A 05/05/2017   DES x 2. Preserved LV function.  Procedure: CORONARY  STENT INTERVENTION;  Surgeon: Anner Alm ORN, MD;  Location: [facility] INVASIVE CV LAB;  Service: Cardiovascular;: CAD S/P PCI: p-mLAD Synergy DES 2.75 x 16 (3.1-2.9 mm), p-mRI Synergy DES 3.0 x 12 (3.3 mm)   dental implants     ESOPHAGOGASTRODUODENOSCOPY  07/08/2017   06/2017 GI RECOMMENDED D/C ASPIRIN :  superfic non-bleeding ulcers ranging 5-59mm in size in antrum.  +H pylori +.  A few non-bleeding erosions in pre-pyloric region.  Normal duodenum.  12/2022 bleeding duodenal AVM   LEFT HEART CATH AND CORONARY ANGIOGRAPHY N/A 05/05/2017   Procedure: LEFT HEART CATH AND CORONARY ANGIOGRAPHY;  Surgeon: Anner Alm ORN, MD;  Location: [facility] INVASIVE CV LAB;  Service: Cardiovascular;  Laterality: N/A; p-m LAD 80%, mRI 90% --> 2 vessel PCI   NM MYOVIEW  LTD  11/16/2019   Fixed basal inferoseptal and mid anteroseptal perfusion defect consistent with subendocardial scar, NO ISCHEMIA.  EF 52%.  Unable to interpret EKG.  LOW Risk.   PROSTATE BIOPSY  01/2012   Normal    Current Medications: No outpatient medications have been marked as taking for the 08/15/24 encounter (Appointment) with Thomas Reche RAMAN, NP.     Allergies: Ace inhibitors, Shrimp [shellfish allergy], and Thiazide-type diuretics   Family History: Family History  Problem Relation Age of Onset   Heart attack Mother 78   Heart disease Mother 53   Hyperlipidemia Mother    Hypertension Mother    Valvular heart disease Mother 7       s/p MVR-CABG   Cancer Father        colon   Cancer Sister        cervical/ remission   Diabetes Sister        type 2   Hypertension Sister    Hypertension Sister    Diabetes Sister        type 2   Hyperlipidemia Brother    Hypertension Brother    Coronary artery disease Brother 62       CABG x 4   CAD Brother      Social History: Social History   Socioeconomic History   Marital status: Married    Spouse name: Not on file   Number of children: 2   Years of education: Not on file   Highest  education level: Not on file  Occupational History   Occupation: Engineer, Materials    Comment: [employer].  Tobacco Use   Smoking status: Never   Smokeless tobacco: Never  Vaping Use   Vaping status: Never Used  Substance and Sexual Activity   Alcohol use: Yes    Alcohol/week: 2.0 standard drinks of alcohol    Types: 2 Glasses of wine per week    Comment: weekly   Drug use: No   Sexual activity: Yes    Partners: Female  Other Topics Concern   Not on file  Social History Narrative   SH: Married, two teenagers (1 boy and 1 girl) as of 10/2015.   He is a engineer, materials, lives in [place], [place].   He has a PhD in a vertical engineering any JVD in intellectual property   No Tobacco, rare ETOH, no drug use.   Exercise occasionally - most days the week he goes to the gym.         Social Drivers of Corporate Investment Banker Strain: Low Risk  (03/07/2024)   Received from [facility]   Overall Financial Resource Strain (CARDIA)    Difficulty of Paying Living Expenses: Not hard at all  Food Insecurity: No Food Insecurity (04/19/2024)   Hunger Vital Sign    Worried About Running Out of Food in the Last Year: Never true    Ran Out of Food in the Last Year: Never true  Transportation Needs: No Transportation Needs (03/07/2024)   Received from [facility] - Transportation    Lack of Transportation (Medical): No    Lack of Transportation (Non-Medical): No  Physical Activity: Sufficiently Active (03/07/2024)   Received from [facility]   Exercise Vital Sign    On average, how many days per week do you engage in moderate to strenuous exercise (like a brisk walk)?: 5 days    On average, how many minutes do you engage in exercise at this level?: 50 min  Stress: No Stress Concern Present (03/07/2024)   Received from [facility] Occupational [facility] - Occupational Stress Questionnaire    Feeling of Stress : Only a little  Social Connections: Socially  Integrated (03/07/2024)   Received from [facility]   Social Network    How would you rate your social network (family, work, friends)?: Good participation with social networks     ROS:   Please see the history of present illness. All other systems reviewed and are negative.   EKGs/Labs/Other Studies Reviewed:    The following studies were personally reviewed today: Cath 05/05/17: DES to LAD and ramus, LVEDP normal Stress test 11/16/19: low risk EKG today: ***   Recent Labs: 12/09/2023: Hemoglobin 16.4; Platelets 149.0 02/15/2024: ALT 27 05/29/2024: BUN 21; Creatinine, Ser 1.37; Potassium 4.1; Sodium 138 06/08/2024: Cholesterol 154; HDL 38.80; Triglycerides 151.0   Risk Assessment/Calculations:      No BP recorded.  {Refresh Note OR Click here to enter BP  :1}***         Physical Exam:    VS:  There were no vitals taken for this visit.    Wt Readings from Last 3 Encounters:  04/19/24 156 lb 3.2 oz (70.9 kg)  02/25/24 155 lb 6.4 oz (70.5 kg)  02/04/24 157 lb 1.6 oz (71.3 kg)     GEN: *** Well nourished, well developed, NAD NECK: No JVD; No carotid bruits CARDIAC: RRR, no murmurs, rubs, gallops RESPIRATORY:  Clear to auscultation without rales, wheezing or rhonchi  ABDOMEN: Soft, non-tender, non-distended SKIN: Warm and dry, no edema NEUROLOGIC:  Alert and oriented, pleasant affect  ASSESSMENT & PLAN:   No diagnosis found.      {Are you ordering a CV Procedure (e.g. stress test, cath, DCCV, TEE, etc)?   Press F2        :[id number]}   Disposition:  Signed, Saddie GORMAN Cleaves, NP  08/14/2024 8:19 PM    [facility]

## 2024-08-15 ENCOUNTER — Ambulatory Visit (INDEPENDENT_AMBULATORY_CARE_PROVIDER_SITE_OTHER)

## 2024-08-15 ENCOUNTER — Encounter (HOSPITAL_BASED_OUTPATIENT_CLINIC_OR_DEPARTMENT_OTHER): Payer: Self-pay

## 2024-08-15 ENCOUNTER — Ambulatory Visit

## 2024-08-15 VITALS — BP 114/74 | HR 75 | Ht 65.0 in | Wt 143.0 lb

## 2024-08-15 DIAGNOSIS — R002 Palpitations: Secondary | ICD-10-CM

## 2024-08-15 DIAGNOSIS — E782 Mixed hyperlipidemia: Secondary | ICD-10-CM | POA: Diagnosis not present

## 2024-08-15 DIAGNOSIS — I10 Essential (primary) hypertension: Secondary | ICD-10-CM | POA: Diagnosis not present

## 2024-08-15 DIAGNOSIS — I2510 Atherosclerotic heart disease of native coronary artery without angina pectoris: Secondary | ICD-10-CM | POA: Diagnosis not present

## 2024-08-15 DIAGNOSIS — Z9861 Coronary angioplasty status: Secondary | ICD-10-CM

## 2024-08-15 MED ORDER — ATORVASTATIN CALCIUM 10 MG PO TABS: 10.0000 mg | ORAL_TABLET | Freq: Every day | ORAL | 1 refills | Status: DC

## 2024-08-15 NOTE — Addendum Note (Signed)
 Addended by: NOLA SADDIE RAMAN on: 08/15/2024 03:05 PM   Modules accepted: Orders

## 2024-08-15 NOTE — Progress Notes (Addendum)
 Cardiology Office Note   Date:  08/15/2024  ID:  Thomas Spencer 04-Dec-1965 [id number]  PCP: Candise Aleene DEL, MD  [facility] Providers Cardiologist:  Annabella Scarce, MD  Chief Complaint: Thomas Spencer presents to the clinic for new onset palpitations.    Thomas Spencer established cardiology care after [facility] 04/2017 showed severe CAD in mid LAD and proximal ramus intermedius, for which both were treated with a DES. He had a stress test 11/2019 that was low risk. He was admitted 12/2022 to [facility] for GI hemorrhage and was discharged on a PPI BID. Aspirin  stopped, continued on Plavix .  He was last seen in the clinic on 02/06/24 for routine follow-up. He had switched from rosuvastatin  to atorvastatin , due to abnormal kidney function per patient. He reported hyperglycemia, for which he was debating starting Jardiance  and was interested in speaking with a dietitian. Referral to dietitian and lipid profile/LPA ordered. Labs were obtained 02/25/24 showing LDL 58, HDL 33, TGs 99, LPA <10. He was scheduled for one year follow up.     History of Present Illness: Thomas Spencer is a 58 y.o. male with PMH of CAD s/p PCI, hypertension, hyperlipidemia, and type 2 DM who presents to the clinic for palpitations. He has always intermittently experienced palpitations, but they increased in frequency and intensity about four weeks ago. However, symptoms have improved slightly in the last two weeks. States that he stopped drinking caffeine several months ago and stays adequately hydrated. Takes a daily multivitamin. Denies increased stressors. He has not taken prescribed as needed metoprolol . Stopped taking atorvastatin  and Zetia  several months ago, but continues taking Repatha . Does not regularly check BP at home but 110s/70s when he does. Reports no shortness of breath nor dyspnea on exertion. Reports no chest pain, pressure, or tightness, edema, orthopnea, PND.  ROS: Please see the history of present illness. All  other systems reviewed and are negative.   Studies Reviewed: Cardiac Studies & Procedures   ______________________________________________________________________________________________ CARDIAC CATHETERIZATION  CARDIAC CATHETERIZATION 05/05/2017  Conclusion Images from the original result were not included.   LESION #1: Prox LAD to Mid LAD lesion, 80 %stenosed.  A STENT SYNERGY DES [id number] drug eluting stent was successfully placed. - Postdilated and tapered fashion to 3.1-2.9 mm  Post intervention, there is a 0% residual stenosis.  LESION #2: Ramus lesion, 85 %stenosed.  A STENT SYNERGY DES 3X12 drug eluting stent was successfully placed. Postdilated to 3.3 mm  Post intervention, there is a 0% residual stenosis.  There is hyperdynamic left ventricular systolic function. The left ventricular ejection fraction is greater than 65% by visual estimate.  LV end diastolic pressure is normal.  Severe 2 vessel disease involving the mid LAD and proximal ramus intermedius treated with 2 DES stents. Otherwise mild to moderate disease elsewhere. Hyperdynamic left ventricle with normal LVEDP.  Plan:  Overnight monitoring and post procedure unit with TR band removal per protocol. - With 2 vessel PCI, not appropriate for same-day discharge  Dual and platelet therapy for minimum one year with aspirin  and Plavix   Increase simvastatin  to 40 mm daily  Continue ARB, but no beta blocker due to bradycardia  Likely discharge tomorrow.  Follow-up with me OR APP in roughly 2 weeks   Alm Clay, M.D., M.S. Interventional Cardiologist  Pager # [phone] Phone # [phone] [address] [place], [place] [address]  Findings Coronary Findings Diagnostic  Dominance: Right  Left Main Vessel is large. Vessel is angiographically normal.  Left Anterior Descending The lesion is  segmental, eccentric and irregular.  First Diagonal Branch Vessel is small in  size.  First Septal Branch Vessel is small in size.  Second Diagonal Branch Vessel is moderate in size.  Second Septal Branch Vessel is small in size.  Ramus Intermedius The lesion is focal, eccentric and irregular.  Lateral Ramus Intermedius Vessel is moderate in size.  Left Circumflex  First Obtuse Marginal Branch Vessel is moderate in size. The lesion is tubular and smooth.  Right Coronary Artery Vessel is large. There is mild  diffuse disease throughout the vessel. The vessel is mildly tortuous. The lesion is tubular, eccentric and smooth.  Acute Marginal Branch Vessel is small in size.  Right Posterior Descending Artery The vessel exhibits minimal luminal irregularities.  Inferior Septal Vessel is small in size.  Right Posterior Atrioventricular Artery Vessel is moderate in size.  First Right Posterolateral Branch There is mild disease in the vessel.  Intervention  Prox LAD to Mid LAD lesion Angioplasty Lesion length: 14 mm. Lesion crossed with guidewire using a WIRE MARVEL STR TIP 190CM. Pre-stent angioplasty was performed using a BALLOON SAPPHIRE 2.5X12. Maximum pressure: 10 atm. Inflation time: 20 sec. A STENT SYNERGY DES [id number] drug eluting stent was successfully placed. Minimum lumen area: 3.1 mm. Stent strut is well apposed. Post-stent angioplasty was performed using a BALLOON SAPPHIRE [place] 3.0X12. Maximum pressure: 18 atm. Inflation time: 30 sec. The pre-interventional distal flow is normal (TIMI 3).  The post-interventional distal flow is normal (TIMI 3). The intervention was successful . No complications occurred at this lesion. CATH VISTA GUIDE 6FR XBLAD3.5. There is a 0% residual stenosis post intervention.  Ramus lesion Angioplasty Lesion length: 10 mm. Lesion crossed with guidewire using a WIRE MARVEL STR TIP 190CM. Pre-stent angioplasty was performed using a BALLOON SAPPHIRE 2.5X12. Maximum pressure: 10 atm. Inflation time: 20 sec. A STENT SYNERGY  DES 3X12 drug eluting stent was successfully placed. Minimum lumen area: 3.3 mm. Stent strut is well apposed. Post-stent angioplasty was performed using a STENT SYNERGY DES 3X12. Maximum pressure: 18 atm. Inflation time: 30 sec. With Stent Balloon. The pre-interventional distal flow is normal (TIMI 3).  The post-interventional distal flow is normal (TIMI 3). The intervention was successful . No complications occurred at this lesion. CATH VISTA GUIDE 6FR XBLAD3.5 There is a 0% residual stenosis post intervention.   STRESS TESTS  MYOCARDIAL PERFUSION IMAGING 11/16/2019  Interpretation Summary  EKG nondiagnostic due to baseline ST Changes  Myovue scan with probable normal perfusion, cannot exclude small region of subendocardial scar (anteroseptal, mid). No significant ischemia  LVEF 52%  Low risk scan        CT SCANS  CT CARDIAC SCORING (SELF PAY ONLY) 03/26/2017  Addendum 03/26/2017  3:27 PM ADDENDUM REPORT: 03/26/2017 15:24  CLINICAL DATA:  Risk stratification  EXAM: Coronary Calcium  Score  TECHNIQUE: The patient was scanned on a Siemens Somatom 64 slice scanner. Axial non-contrast 3 mm slices were carried out through the heart. The data set was analyzed on a dedicated work station and scored using the Agatson method.  FINDINGS: Non-cardiac: See separate report from [facility].  Ascending Aorta:  3.3 cm  Pericardium: Normal  Coronary arteries: Significant 3 vessel coronary calcium  especially the proximal / mid LAD and mid RCA  IMPRESSION: Coronary calcium  score of 343 . This was 95 th percentile for age and sex matched control.  Maude Emmer   Electronically Signed By: Maude Emmer M.D. On: 03/26/2017 15:24  Narrative EXAM: OVER-READ INTERPRETATION  CT CHEST  The following report is an over-read performed by radiologist Dr. Franky Leff [facility] on 03/26/2017. This over-read does not include interpretation of cardiac or coronary  anatomy or pathology. The coronary calcium  score interpretation by the cardiologist is attached.  COMPARISON:  None.  FINDINGS: Cardiovascular: Heart is normal size. Visualized aorta normal caliber.  Mediastinum/Nodes: No adenopathy in the lower mediastinum or hila.  Lungs/Pleura: Visualized lungs are clear.  No effusions.  Upper Abdomen: Imaging into the upper abdomen shows no acute findings.  Musculoskeletal: Chest wall soft tissues and visualized bony structures unremarkable.  IMPRESSION: No acute or significant extracardiac abnormality.  Electronically Signed: By: Franky Crease M.D. On: 03/26/2017 09:40     ______________________________________________________________________________________________      Physical Exam: BP 114/74 (BP Location: Left Arm, Patient Position: Sitting, Cuff Size: Normal)   Pulse 75   Ht 5' 5 (1.651 m)   Wt 143 lb (64.9 kg)   SpO2 97%   BMI 23.80 kg/m   GEN: Well nourished, well developed, NAD NECK: No JVD CARDIAC: RRR, no murmurs, rubs, gallops RESPIRATORY:  Clear to auscultation ABDOMEN: Soft, non-tender, non-distended EXTREMITIES:  No edema, no deformity   Assessment & Plan: 1. Palpitations: Increased frequency and intensity four weeks ago, slightly improved last two weeks. EKG today shows NSR 75 bpm, early repolarization, as in previous EKGs. - Order 14-day Zio patch - Check BMET, CBC, TSH today - Metoprolol  12.5 mg BID as needed - Recommend adequate hydration, limiting caffeine, regular exercise, and managing stress well  2. CAD: Denies anginal symptoms. Stress test 11/2019 low risk.  - Continue Plavix  75 mg daily, Repatha  140 mg q.2 wks, telmisartan  80 mg daily - No aspirin  after GI bleed  3. Hypertension: BP at goal today and at home. - Continue telmisartan  80 mg daily  4. Hyperlipidemia, LDL goal < 70: Atorvastatin  and Zetia  were stopped, continued taking Repatha . LDL 85 on 06/08/24 while off atorvastatin  and Zetia  but  on Repatha . He has eaten today. - Check direct LDL today - Consider restarting atorvastatin  if LDL not at goal  Dispo: Follow-up with Dr. Raford or APP in 3 months.   Signed, Saddie GORMAN Cleaves, NP 08/15/2024 3:04 PM [facility]

## 2024-08-15 NOTE — Progress Notes (Unsigned)
Enrolled for [employer] to mail a ZIO XT long term holter monitor to the patients address on file.   Dr. Oval Linsey to read.

## 2024-08-15 NOTE — Patient Instructions (Addendum)
 Medication Instructions:   We encourage you to start back taking atorvastatin  (Lipitor) 10 mg by mouth daily  *If you need a refill on your cardiac medications before your next appointment, please call your pharmacy*   Lab Work:  TODAY--3RD FLOOR [address] PRIMARY CARE ([facility])--BMET, TSH, CBC, AND DIRECT LDL  If you have labs (blood work) drawn today and your tests are completely normal, you will receive your results only by: MyChart Message (if you have MyChart) OR A paper copy in the mail If you have any lab test that is abnormal or we need to change your treatment, we will call you to review the results.   Testing/Procedures:  Thomas Spencer- Long Term Monitor Instructions  Your physician has requested you wear a ZIO patch monitor for 14 days.  This is a single patch monitor. [employer] supplies one patch monitor per enrollment. Additional stickers are not available. Please do not apply patch if you will be having a Nuclear Stress Test,  Echocardiogram, Cardiac CT, MRI, or Chest Xray during the period you would be wearing the  monitor. The patch cannot be worn during these tests. You cannot remove and re-apply the  ZIO XT patch monitor.  Your ZIO patch monitor will be mailed 3 day USPS to your address on file. It may take 3-5 days  to receive your monitor after you have been enrolled.  Once you have received your monitor, please review the enclosed instructions. Your monitor  has already been registered assigning a specific monitor serial # to you.  Billing and Patient Assistance Program Information  We have supplied [employer] with any of your insurance information on file for billing purposes. [employer] offers a sliding scale Patient Assistance Program for patients that do not have  insurance, or whose insurance does not completely cover the cost of the ZIO monitor.  You must apply for the Patient Assistance Program to qualify for this discounted rate.  To apply, please call [employer] at  [phone], select option 4, select option 2, ask to apply for  Patient Assistance Program. Thomas Spencer will ask your household income, and how many people  are in your household. They will quote your out-of-pocket cost based on that information.  [employer] will also be able to set up a 74-month, interest-free payment plan if needed.  Applying the monitor   Shave hair from upper left chest.  Hold abrader disc by orange tab. Rub abrader in 40 strokes over the upper left chest as  indicated in your monitor instructions.  Clean area with 4 enclosed alcohol pads. Let dry.  Apply patch as indicated in monitor instructions. Patch will be placed under collarbone on left  side of chest with arrow pointing upward.  Rub patch adhesive wings for 2 minutes. Remove white label marked 1. Remove the white  label marked 2. Rub patch adhesive wings for 2 additional minutes.  While looking in a mirror, press and release button in center of patch. A small green light will  flash 3-4 times. This will be your only indicator that the monitor has been turned on.  Do not shower for the first 24 hours. You may shower after the first 24 hours.  Press the button if you feel a symptom. You will hear a small click. Record Date, Time and  Symptom in the Patient Logbook.  When you are ready to remove the patch, follow instructions on the last 2 pages of Patient  Logbook. Stick patch monitor onto the last page of Patient  Logbook.  Place Patient Logbook in the blue and white box. Use locking tab on box and tape box closed  securely. The blue and white box has prepaid postage on it. Please place it in the mailbox as  soon as possible. Your physician should have your test results approximately 7 days after the  monitor has been mailed back to [facility].  Call [facility] Customer Care at [phone] if you have questions regarding  your ZIO XT patch monitor. Call them immediately if you see an orange light  blinking on your  monitor.  If your monitor falls off in less than 4 days, contact our Monitor department at [phone].  If your monitor becomes loose or falls off after 4 days call [employer] at [phone] for  suggestions on securing your monitor   Follow-Up:  3 MONTHS WITH DR. Metcalfe, Thomas BANE, NP OR Thomas WALKER, NP

## 2024-08-16 ENCOUNTER — Ambulatory Visit: Payer: Self-pay

## 2024-08-16 LAB — CBC
Hematocrit: 49.4 % (ref 37.5–51.0)
Hemoglobin: 17 g/dL (ref 13.0–17.7)
MCH: 33.4 pg — ABNORMAL HIGH (ref 26.6–33.0)
MCHC: 34.4 g/dL (ref 31.5–35.7)
MCV: 97 fL (ref 79–97)
Platelets: 184 x10E3/uL (ref 150–450)
RBC: 5.09 x10E6/uL (ref 4.14–5.80)
RDW: 13.1 % (ref 11.6–15.4)
WBC: 3.2 x10E3/uL — ABNORMAL LOW (ref 3.4–10.8)

## 2024-08-16 LAB — BASIC METABOLIC PANEL WITH GFR
BUN/Creatinine Ratio: 12 (ref 10–24)
BUN: 16 mg/dL (ref 8–27)
CO2: 24 mmol/L (ref 20–29)
Calcium: 9.9 mg/dL (ref 8.6–10.2)
Chloride: 99 mmol/L (ref 96–106)
Creatinine, Ser: 1.29 mg/dL — ABNORMAL HIGH (ref 0.76–1.27)
Glucose: 81 mg/dL (ref 70–99)
Potassium: 5.1 mmol/L (ref 3.5–5.2)
Sodium: 138 mmol/L (ref 134–144)
eGFR: 63 mL/min/1.73 (ref >59)

## 2024-08-16 LAB — TSH: TSH: 1.07 u[IU]/mL (ref 0.450–4.500)

## 2024-08-16 LAB — LDL CHOLESTEROL, DIRECT: LDL Direct: 78 mg/dL (ref 0–99)

## 2024-08-16 MED ORDER — ATORVASTATIN CALCIUM 10 MG PO TABS: 10.0000 mg | ORAL_TABLET | Freq: Every day | ORAL | 3 refills | Status: AC

## 2024-08-16 NOTE — Telephone Encounter (Signed)
 Spoke with pt. Pt was notified of lab results and recommendations. Lipitor 10 mg daily was sent to pts pharmacy. Pt voiced understanding and will f/u as planned.

## 2024-08-25 ENCOUNTER — Other Ambulatory Visit (HOSPITAL_BASED_OUTPATIENT_CLINIC_OR_DEPARTMENT_OTHER): Payer: Self-pay | Admitting: Family

## 2024-09-08 ENCOUNTER — Other Ambulatory Visit: Payer: Self-pay | Admitting: Family Medicine

## 2024-09-08 NOTE — Telephone Encounter (Signed)
 Copied from CRM #[id number]. Topic: Clinical - Prescription Issue >> Sep 08, 2024 12:36 PM Alfonso ORN wrote: Reason for CRM: Pt called requesting refill of clopidogrel  (PLAVIX ) 75 MG tablet and stated he needs medication filled by eod due to travel. Advised 3 bsd timeframe pharmacy submitted request.  Pt also requesting a 90 day supply going forward for all medications. Please advise

## 2024-09-18 DIAGNOSIS — R002 Palpitations: Secondary | ICD-10-CM | POA: Diagnosis not present

## 2024-09-18 NOTE — Progress Notes (Signed)
 Called and spoke with patient regarding his recent heart monitor results. Patient verbalized understanding.

## 2024-09-22 ENCOUNTER — Other Ambulatory Visit: Payer: Self-pay | Admitting: Family

## 2024-09-22 DIAGNOSIS — E785 Hyperlipidemia, unspecified: Secondary | ICD-10-CM

## 2024-10-08 ENCOUNTER — Other Ambulatory Visit: Payer: Self-pay | Admitting: Family Medicine

## 2024-10-12 ENCOUNTER — Other Ambulatory Visit: Payer: Self-pay | Admitting: Family Medicine

## 2024-10-17 ENCOUNTER — Encounter (HOSPITAL_BASED_OUTPATIENT_CLINIC_OR_DEPARTMENT_OTHER): Payer: Self-pay

## 2024-10-17 MED ORDER — CLOPIDOGREL BISULFATE 75 MG PO TABS: 75.0000 mg | ORAL_TABLET | Freq: Every day | ORAL | 4 refills | Status: AC

## 2024-11-17 ENCOUNTER — Ambulatory Visit (HOSPITAL_BASED_OUTPATIENT_CLINIC_OR_DEPARTMENT_OTHER): Admitting: Family

## 2025-01-30 ENCOUNTER — Ambulatory Visit (HOSPITAL_BASED_OUTPATIENT_CLINIC_OR_DEPARTMENT_OTHER): Admitting: Cardiovascular Disease
